# Patient Record
Sex: Female | Born: 2000 | Race: White | Hispanic: No | Marital: Single | Smoking: Never smoker
Health system: Southern US, Community
[De-identification: ages and names within clinical notes are randomized; demographics above are authoritative.]

## PROBLEM LIST (undated history)

## (undated) DIAGNOSIS — T7840XA Allergy, unspecified, initial encounter: Secondary | ICD-10-CM

## (undated) DIAGNOSIS — I639 Cerebral infarction, unspecified: Secondary | ICD-10-CM

## (undated) DIAGNOSIS — G809 Cerebral palsy, unspecified: Secondary | ICD-10-CM

## (undated) HISTORY — DX: Cerebral infarction, unspecified: I63.9

## (undated) HISTORY — PX: TIBIA / FIBIA LENGTHENING: SHX807

## (undated) HISTORY — DX: Cerebral palsy, unspecified: G80.9

## (undated) HISTORY — DX: Allergy, unspecified, initial encounter: T78.40XA

## (undated) HISTORY — PX: OTHER SURGICAL HISTORY: SHX169

## (undated) HISTORY — PX: HAMSTRING LENGTHENING: SHX1722

## (undated) HISTORY — PX: FOOT SURGERY: SHX648

---
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
## 2022-03-17 ENCOUNTER — Ambulatory Visit (INDEPENDENT_AMBULATORY_CARE_PROVIDER_SITE_OTHER): Payer: BC Managed Care – PPO | Admitting: Family Medicine

## 2022-03-17 ENCOUNTER — Encounter: Payer: Self-pay | Admitting: Family Medicine

## 2022-03-17 VITALS — BP 118/72 | HR 90 | Temp 98.4°F | Ht 62.0 in | Wt 116.0 lb

## 2022-03-17 DIAGNOSIS — G800 Spastic quadriplegic cerebral palsy: Secondary | ICD-10-CM

## 2022-03-17 MED ORDER — FEROSUL 325 (65 FE) MG PO TABS: 325.0000 mg | ORAL_TABLET | Freq: Every morning | ORAL | 1 refills | Status: AC

## 2022-03-17 NOTE — Assessment & Plan Note (Signed)
She is stable, at baseline (ambulatory, but with with some gait abnormalities, balance issues, and weakness). Rechecking CBC, iron, and vitamin D today. She has been on supplementation (refilling iron for her). Will make additional changes based on labs if indicated. PT referral placed to [facility] at her request. No acute concerns today.

## 2022-03-17 NOTE — Patient Instructions (Signed)
Thank you for choosing [facility] for your Primary Care needs. I am excited for the opportunity to partner with you to meet your health care goals. It was a pleasure meeting you today!   Information on diet, exercise, and health maintenance recommendations are listed below. This is information to help you be sure you are on track for optimal health and monitoring.   Please look over this and let us know if you have any questions or if you have completed any of the health maintenance outside of [facility] so that we can be sure your records are up to date.  ___________________________________________________________  MyChart:  For all urgent or time sensitive needs we ask that you please call the office to avoid delays. Our number is [phone]. MyChart is not constantly monitored and due to the large volume of messages a day, replies may take up to 72 business hours.  MyChart Policy: MyChart allows for you to see your visit notes, after visit summary, provider recommendations, lab and tests results, make an appointment, request refills, and contact your provider or the office for non-urgent questions or concerns. Providers are seeing patients during normal business hours and do not have built in time to review MyChart messages.  We ask that you allow a minimum of 3 business days for responses to MyChart messages. For this reason, please do not send urgent requests through MyChart. Please call the office at [phone]. New and ongoing conditions may require a visit. We have virtual and in-person visits available for your convenience.  Complex MyChart concerns may require a visit. Your provider may request you schedule a virtual or in-person visit to ensure we are providing the best care possible. MyChart messages sent after 11:00 AM on Friday will not be received by the provider until Monday morning.    Lab and Test Results: You will receive your lab and  test results on MyChart as soon as they are completed and results have been sent by the lab or testing facility. Due to this service, you will receive your results BEFORE your provider.  I review lab and test results each morning prior to seeing patients. Some results require collaboration with other providers to ensure you are receiving the most appropriate care. For this reason, we ask that you please allow a minimum of 3-5 business days from the time that ALL results have been received for your provider to receive and review lab and test results and contact you about these.  Most lab and test result comments from the provider will be sent through MyChart. Your provider may recommend changes to the plan of care, follow-up visits, repeat testing, ask questions, or request an office visit to discuss these results. You may reply directly to this message or call the office to provide information for the provider or set up an appointment. In some instances, you will be called with test results and recommendations. Please let us know if this is preferred and we will make note of this in your chart to provide this for you.    If you have not heard a response to your lab or test results in 5 business days from all results returning to MyChart, please call the office to let us know. We ask that you please avoid calling prior to this time unless there is an emergent concern. Due to high call volumes, this can delay the resulting process.  After Hours: For all non-emergency after hours needs,   please call the office at [phone] and select the option to reach the on-call  service. On-call services are shared between multiple [facility] offices and therefore it will not be possible to speak directly with your provider. On-call providers may provide medical advice and recommendations, but are unable to provide refills for maintenance medications.  For all emergency or urgent medical needs after normal business  hours, we recommend that you seek care at the closest Urgent Care or Emergency Department to ensure appropriate treatment in a timely manner.  [facility] has a 24 hour emergency room located on the ground floor for your convenience.   Urgent Concerns During the Business Day Providers are seeing patients from 8AM to 5PM with a busy schedule and are most often not able to respond to non-urgent calls until the end of the day or the next business day. If you should have URGENT concerns during the day, please call and speak to the nurse or schedule a same day appointment so that we can address your concern without delay.   Thank you, again, for choosing me as your health care partner. I appreciate your trust and look forward to learning more about you.   Levie Wages B. [name] Henton, DNP, FNP-C  ___________________________________________________________  Health Maintenance Recommendations Screening Testing Mammogram Every 1-2 years based on history and risk factors Starting at age 50 Pap Smear Ages 21-39 every 3 years Ages 30-65 every 5 years with HPV testing More frequent testing may be required based on results and history Colon Cancer Screening Every 1-10 years based on test performed, risk factors, and history Starting at age 45 Bone Density Screening Every 2-10 years based on history Starting at age 65 for women Recommendations for men differ based on medication usage, history, and risk factors AAA Screening One time ultrasound Men 65-75 years old who have ever smoked Lung Cancer Screening Low Dose Lung CT every 12 months Age 50-80 years with a 20 pack-year smoking history who still smoke or who have quit within the last 15 years  Screening Labs Routine  Labs: Complete Blood Count (CBC), Complete Metabolic Panel (CMP), Cholesterol (Lipid Panel) Every 6-12 months based on history and medications May be recommended more frequently based on current conditions or  previous results Hemoglobin A1c Lab Every 3-12 months based on history and previous results Starting at age 45 or earlier with diagnosis of diabetes, high cholesterol, BMI >26, and/or risk factors Frequent monitoring for patients with diabetes to ensure blood sugar control Thyroid Panel (TSH w/ T3 & T4) Every 6 months based on history, symptoms, and risk factors May be repeated more often if on medication HIV One time testing for all patients 13 and older May be repeated more frequently for patients with increased risk factors or exposure Hepatitis C One time testing for all patients 18 and older May be repeated more frequently for patients with increased risk factors or exposure Gonorrhea, Chlamydia Every 12 months for all sexually active persons 13-24 years Additional monitoring may be recommended for those who are considered high risk or who have symptoms PSA Men 40-54 years old with risk factors Additional screening may be recommended from age 55-69 based on risk factors, symptoms, and history  Vaccine Recommendations Tetanus Booster All adults every 10 years Flu Vaccine All patients 6 months and older every year COVID Vaccine All patients 12 years and older Initial dosing with booster May recommend additional booster based on age and health history HPV Vaccine 2 doses all patients   age 9-26 Dosing may be considered for patients over 26 Shingles Vaccine (Shingrix) 2 doses all adults 50 years and older Pneumonia (Pneumovax 23) All adults 65 years and older May recommend earlier dosing based on health history Pneumonia (Prevnar 13) All adults 65 years and older Dosed 1 year after Pneumovax 23 Pneumonia (Prevnar 20) All adults 65 years and older (adults 19-64 with certain conditions or risk factors) 1 dose  For those who have no received Prevnar 13 vaccine previously   Additional Screening, Testing, and Vaccinations may be recommended on an individualized basis based on  family history, health history, risk factors, and/or exposure.  __________________________________________________________  Diet Recommendations for All Patients  I recommend that all patients maintain a diet low in saturated fats, carbohydrates, and cholesterol. While this can be challenging at first, it is not impossible and small changes can make big differences.  Things to try: Decreasing the amount of soda, sweet tea, and/or juice to one or less per day and replace with water While water is always the first choice, if you do not like water you may consider adding a water additive without sugar to improve the taste other sugar free drinks Replace potatoes with a brightly colored vegetable at dinner Use healthy oils, such as canola oil or olive oil, instead of butter or hard margarine Limit your bread intake to two pieces or less a day Replace regular pasta with low carb pasta options Bake, broil, or grill foods instead of frying Monitor portion sizes  Eat smaller, more frequent meals throughout the day instead of large meals  An important thing to remember is, if you love foods that are not great for your health, you don't have to give them up completely. Instead, allow these foods to be a reward when you have done well. Allowing yourself to still have special treats every once in a while is a nice way to tell yourself thank you for working hard to keep yourself healthy.   Also remember that every day is a new day. If you have a bad day and "fall off the wagon", you can still climb right back up and keep moving along on your journey!  We have resources available to help you!  Some websites that may be helpful include: www.MyPlate.gov  Www.VeryWellFit.com _____________________________________________________________  Activity Recommendations for All Patients  I recommend that all adults get at least 20 minutes of moderate physical activity that elevates your heart rate at least 5  days out of the week.  Some examples include: Walking or jogging at a pace that allows you to carry on a conversation Cycling (stationary bike or outdoors) Water aerobics Yoga Weight lifting Dancing If physical limitations prevent you from putting stress on your joints, exercise in a pool or seated in a chair are excellent options.  Do determine your MAXIMUM heart rate for activity: 220 - YOUR AGE = MAX Heart Rate   Remember! Do not push yourself too hard.  Start slowly and build up your pace, speed, weight, time in exercise, etc.  Allow your body to rest between exercise and get good sleep. You will need more water than normal when you are exerting yourself. Do not wait until you are thirsty to drink. Drink with a purpose of getting in at least 8, 8 ounce glasses of water a day plus more depending on how much you exercise and sweat.    If you begin to develop dizziness, chest pain, abdominal pain, jaw pain, shortness of breath, headache,   vision changes, lightheadedness, or other concerning symptoms, stop the activity and allow your body to rest. If your symptoms are severe, seek emergency evaluation immediately. If your symptoms are concerning, but not severe, please let us know so that we can recommend further evaluation.     

## 2022-03-17 NOTE — Progress Notes (Signed)
New Patient Office Visit  Subjective    Patient ID: Kerri Robertson, female    DOB: July 09, 2000  Age: 21 y.o. MRN: [id number]  CC:  Chief Complaint  Patient presents with   Establish Care    Iron and starting back with PT at [facility]     HPI Kerri Robertson presents to establish care as a new patient. She was previously followed by Pediatrics at [facility] She is here with her caretaker Britt Boozer.   She lives with mom and dad.  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   Cerebral palsy - She has a left arm contracture and strength/balance issues primarily. They report she is high-risk for seizures, but she has never had one. She reports some muscle cramps/spasticity at times. She goes to PT [facility]) regularly but needs a new referral.  - she takes 325 mg ferrous sulfate daily.   - she takes vitamin D daily  She is having regular periods (about every 4 weeks, 4-6 days in length; LMP 02/27/22).  She denies any depression/anxiety, SI/HI. She wears prescription glasses - sees eye doctor regularly. She sees dentist regularly.     Outpatient Encounter Medications as of 03/17/2022  Medication Sig   Cholecalciferol (VITAMIN D-3) 25 MCG (1000 UT) CAPS Take by mouth.   [DISCONTINUED] ibuprofen (ADVIL) 200 MG tablet Take 200 mg by mouth every 6 (six) hours as needed for moderate pain.   FEROSUL 325 (65 Fe) MG tablet Take 1 tablet (325 mg total) by mouth every morning.   [DISCONTINUED] FEROSUL 325 (65 Fe) MG tablet Take 325 mg by mouth every morning. (Patient not taking: Reported on 03/17/2022)   [DISCONTINUED] ondansetron (ZOFRAN ODT) 4 MG disintegrating tablet 4mg  ODT q4 hours prn nausea/vomit (Patient not taking: Reported on 06/09/2019)   [DISCONTINUED] propranolol (INDERAL) 10 MG tablet Take 0.5 tablets (5 mg total) by mouth 3 (three) times daily as needed. (Patient not taking: Reported on 06/09/2019)   No facility-administered  encounter medications on file as of 03/17/2022.    Past Medical History:  Diagnosis Date   Allergy    Cerebral palsy ([name])    Stroke [facility])     Past Surgical History:  Procedure Laterality Date   ARM SURGERY     2013   FOOT SURGERY     2010   HAMSTRING LENGTHENING     TIBIA / FIBIA LENGTHENING      Family History  Problem Relation Age of Onset   Hypertension Mother    Migraines Mother    Depression Mother    Anxiety disorder Mother    Hypertension Father    Depression Father    Anxiety disorder Father    Hypertension Maternal Grandmother    Hypertension Maternal Grandfather    Heart disease Other    Diabetes Other    Seizures Neg Hx    Bipolar disorder Neg Hx    Schizophrenia Neg Hx    ADD / ADHD Neg Hx    Autism Neg Hx     Social History   Socioeconomic History   Marital status: Single    Spouse name: Not on file   Number of children: Not on file   Years of education: Not on file   Highest education level: Not on file  Occupational History   Not on file  Tobacco Use   Smoking status: Never   Smokeless tobacco: Never  Vaping Use  Vaping Use: Never used  Substance and Sexual Activity   Alcohol use: Not on file   Drug use: Never   Sexual activity: Not on file  Other Topics Concern   Not on file  Social History Narrative   Ruthanna is in the 12th grade at [employer]; she does well in school. She enjoys acting, tennis, bowling and singing. She lives with her mother and father.    Social Determinants of Health   Financial Resource Strain: Not on file  Food Insecurity: Not on file  Transportation Needs: Not on file  Physical Activity: Not on file  Stress: Not on file  Social Connections: Not on file  Intimate Partner Violence: Not on file    ROS All review of systems negative except what is listed in the HPI      Objective    BP 118/72   Pulse 90   Temp 98.4 F (36.9 C) (Oral)   Ht 5\' 2"  (1.575 m)   Wt 116 lb (52.6 kg)   LMP  02/26/2022   SpO2 99%   BMI 21.22 kg/m   Physical Exam Vitals reviewed.  Constitutional:      General: She is not in acute distress.    Appearance: Normal appearance. She is normal weight. She is not ill-appearing.  HENT:     Head: Normocephalic and atraumatic.  Cardiovascular:     Rate and Rhythm: Normal rate and regular rhythm.     Pulses: Normal pulses.     Heart sounds: Normal heart sounds.  Pulmonary:     Effort: Pulmonary effort is normal.     Breath sounds: Normal breath sounds.  Musculoskeletal:     Cervical back: Normal range of motion and neck supple. No tenderness.     Right lower leg: No edema.     Left lower leg: No edema.     Comments: LUE contracture, LLE mild malalignment   Lymphadenopathy:     Cervical: No cervical adenopathy.  Skin:    General: Skin is warm and dry.  Neurological:     Mental Status: She is alert and oriented to person, place, and time. Mental status is at baseline.     Cranial Nerves: No cranial nerve deficit.  Psychiatric:        Mood and Affect: Mood normal.        Behavior: Behavior normal.        Thought Content: Thought content normal.        Judgment: Judgment normal.            Assessment & Plan:   Problem List Items Addressed This Visit       Nervous and Auditory   CP (cerebral palsy), spastic, 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[name]) - Primary    She is stable, at baseline (ambulatory, but with with some gait abnormalities, balance issues, and weakness). Rechecking CBC, iron, and vitamin D today. She has been on supplementation (refilling iron for her). Will make additional changes based on labs if indicated. PT referral placed to [facility] at her request. No acute concerns today.       Relevant Medications   FEROSUL 325 (65 Fe) MG tablet   Other Relevant Orders   Ambulatory referral to Physical Therapy   Vitamin D (25 hydroxy)   Iron, TIBC and Ferritin Panel   CBC    Return in about 6 months (around 09/15/2022) for CPE 6-12 months  .   09/17/2022, NP

## 2022-03-18 LAB — CBC
HCT: 38.8 % (ref 36.0–49.0)
Hemoglobin: 13 g/dL (ref 12.0–16.0)
MCHC: 33.5 g/dL (ref 31.0–37.0)
MCV: 93.2 fl (ref 78.0–98.0)
Platelets: 205 10*3/uL (ref 150.0–575.0)
RBC: 4.17 Mil/uL (ref 3.80–5.70)
RDW: 12.5 % (ref 11.4–15.5)
WBC: 5.3 10*3/uL (ref 4.5–13.5)

## 2022-03-18 LAB — VITAMIN D 25 HYDROXY (VIT D DEFICIENCY, FRACTURES): VITD: 19 ng/mL — ABNORMAL LOW (ref 30.00–100.00)

## 2022-03-18 LAB — IRON,TIBC AND FERRITIN PANEL
%SAT: 12 % (calc) — ABNORMAL LOW (ref 15–45)
Ferritin: 57 ng/mL (ref 16–154)
Iron: 36 ug/dL (ref 27–164)
TIBC: 310 mcg/dL (calc) (ref 271–448)

## 2022-03-18 NOTE — Progress Notes (Signed)
-  Vitamin D is low. If you have not been taking your Vitamin D 1,000 units regularly then please restart. If you have been taking it, go ahead and take 2,000 units daily instead. We can recheck in 3 months or so - schedule follow-up. -Iron is slightly low. Restart iron supplementation - refill provided at appointment.  -Blood counts are stable.

## 2022-04-04 ENCOUNTER — Encounter: Payer: Self-pay | Admitting: Family Medicine

## 2022-04-09 NOTE — Progress Notes (Signed)
OUTPATIENT PHYSICAL THERAPY LOWER EXTREMITY EVALUATION   Patient Name: Kerri Robertson MRN: [id number] DOB:09-Jan-2001, 21 y.o., female Today's Date: 04/10/2022  END OF SESSION:  PT End of Session - 04/10/22 1623     Visit Number 1    Date for PT Re-Evaluation 06/19/22    PT Start Time 1454    PT Stop Time 1520    PT Time Calculation (min) 26 min    Activity Tolerance Patient tolerated treatment well    Behavior During Therapy WFL for tasks assessed/performed             Past Medical History:  Diagnosis Date   Allergy    Cerebral palsy ([name])    Stroke ([name])    Past Surgical History:  Procedure Laterality Date   ARM SURGERY     2013   FOOT SURGERY     2010   HAMSTRING LENGTHENING     TIBIA / FIBIA LENGTHENING     Patient Active Problem List   Diagnosis Date Noted   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   Balance problem 05/06/2018   Syncope 05/06/2018   History of cerebrovascular accident 11/18/2017   CP (cerebral palsy), spastic, 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[name]) 06/25/2017   Anxiety state 06/25/2017   Spasticity 06/25/2017   Allergic rhinitis 10/21/2016   Mild intermittent asthma 10/21/2016    PCP: Clayborne Dana, NP  REFERRING PROVIDER: Clayborne Dana, NP  REFERRING DIAG: G80.0 (ICD-10-CM) - CP (cerebral palsy), spastic, 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[name])   THERAPY DIAG:  Spastic quadriplegic cerebral palsy ([name])  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 Her Mom  noted that her leg was starting to turn in during gait as well.  PERTINENT HISTORY: CP (cerebral palsy), spastic, 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[name]) - Primary       She is stable, at baseline (ambulatory, but with with some gait abnormalities, balance issues, and weakness). Rechecking CBC, iron, and vitamin D today. She has been on supplementation (refilling iron for her). Will make additional changes based on labs if indicated. PT referral placed to [facility] at her request. No acute concerns today.     PAIN:  Are you having pain? No  PRECAUTIONS: None  WEIGHT BEARING RESTRICTIONS: No  FALLS:  Has patient fallen in last 6 months? Yes. 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 Hobbies- Runner, broadcasting/film/video, archery, baseball, tennis, dance   PLOF: Needs assistance with ADLs  PATIENT GOALS: Patient would like to improve her flexibility and balance.  NEXT MD VISIT:   OBJECTIVE:   DIAGNOSTIC FINDINGS:  Radiology consult confirms lumbar scoliosis with apex at L2-3, but since patient is asymptomatic, recommend monitoring it and continue to strengthen.        COGNITION: Overall cognitive status: Within functional limits for tasks assessed     SENSATION: Not tested   MUSCLE LENGTH: Hamstrings: Right 40 deg; Left 30 deg   POSTURE: left pelvic obliquity,  weight shift right, and hips rotated to L with L LE held in IR, increased R WB   LOWER EXTREMITY ROM: Trunk lateraly flexed due to scoliosis, L hip unable to achieve neutral ER, abd limited, B hamstrings tight, L ankle does not achieve neutral DF or inversion. LUE with limited ROM at all joints, in all planes. she wears a splint on her wrist at night.    LOWER EXTREMITY MMT: RLE 4/5, LLE with fluctuating tone throughout, HS very weak, hips also weak-MMT  accuracy questionable due to her tone.  FUNCTIONAL TESTS:  5 times sit to stand: 14.77 Timed up and go (TUG): 13.75 Berg Balance Scale: 38  GAIT: Distance walked: clinic distances Assistive device utilized: None Level of assistance: Modified independence Comments: Step-to pattern;Decreased step length - left;Decreased stance time - left;Decreased weight shift to left;Shuffle;Ataxic;Lateral hip instability;Trunk rotated posteriorly on left    TODAY'S TREATMENT:                                                                                                                              DATE:  04/10/22    PATIENT EDUCATION:  Education details: POC Person educated: Patient and Caregiver   Education method: Explanation Education comprehension: verbalized understanding  HOME EXERCISE PROGRAM: TBD  ASSESSMENT:  CLINICAL IMPRESSION: Patient is a 21 y.o. who was seen today for physical therapy evaluation and treatment for a tune up. Her Mom notes that her LLE appears more internally rotated during gait. She had a fall today, but a soft landing. She has not had any other falls and does not know why she fell today. She has multiple limitations in ROM and strength as well as abnormal tone on her L side, scoliosis resulting in her pelvis turned to the L. She will benefit from PT for some stretching to selective muscle groups as well as strength and balance training to decrease fall risk.   OBJECTIVE IMPAIRMENTS: Abnormal gait, decreased balance, decreased coordination, decreased endurance, decreased mobility, difficulty walking, decreased ROM, decreased strength, increased fascial restrictions, increased muscle spasms, impaired flexibility, impaired tone, improper body mechanics, and postural dysfunction.   ACTIVITY LIMITATIONS: standing, squatting, stairs, transfers, and locomotion level  PARTICIPATION LIMITATIONS: shopping, community activity, and occupation  PERSONAL FACTORS: 1  comorbidity: CP  are also affecting patient's functional outcome.   REHAB POTENTIAL: Good  CLINICAL DECISION MAKING: Stable/uncomplicated  EVALUATION COMPLEXITY: Moderate   GOALS: Goals reviewed with patient? Yes  SHORT TERM GOALS: Target date: 05/01/22 I with initial HEP Baseline: Goal status: INITIAL  LONG TERM GOALS: Target date: 06/19/22  I with final HEP Baseline:  Goal status: INITIAL  2.  Improve TUG time to < 12 sec Baseline:  Goal status: INITIAL  3.  Improve 5 x STS to <12 Baseline:  Goal status: INITIAL  4.  Increase BERG score to at least 44 Baseline:  Goal status: INITIAL  5.  Patient will ambulate with normalized gait pattern, minimizing abnormal weight  shifts and increasing LLE position to more neutral hip rotation. Baseline:  Goal status: INITIAL   PLAN:  PT FREQUENCY: 1-2x/week  PT DURATION: 10 weeks  PLANNED INTERVENTIONS: Therapeutic exercises, Therapeutic activity, Neuromuscular re-education, Balance training, Gait training, Patient/Family education, Self Care, Joint mobilization, Dry Needling, Cryotherapy, Moist heat, Ionotophoresis 4mg /ml Dexamethasone, and Manual therapy  PLAN FOR NEXT SESSION: HEP, HS strength, L Hip stretch into ER   Iona Beard, DPT 04/10/2022, 4:29 PM

## 2022-04-10 ENCOUNTER — Ambulatory Visit: Payer: BC Managed Care – PPO | Attending: Family Medicine | Admitting: Physical Therapy

## 2022-04-10 DIAGNOSIS — R531 Weakness: Secondary | ICD-10-CM | POA: Diagnosis present

## 2022-04-10 DIAGNOSIS — R293 Abnormal posture: Secondary | ICD-10-CM

## 2022-04-10 DIAGNOSIS — I69954 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: Secondary | ICD-10-CM | POA: Diagnosis present

## 2022-04-10 DIAGNOSIS — G801 Spastic diplegic cerebral palsy: Secondary | ICD-10-CM | POA: Diagnosis present

## 2022-04-10 DIAGNOSIS — R2681 Unsteadiness on feet: Secondary | ICD-10-CM

## 2022-04-10 DIAGNOSIS — R262 Difficulty in walking, not elsewhere classified: Secondary | ICD-10-CM

## 2022-04-10 DIAGNOSIS — M6281 Muscle weakness (generalized): Secondary | ICD-10-CM | POA: Diagnosis present

## 2022-04-10 DIAGNOSIS — G800 Spastic quadriplegic cerebral palsy: Secondary | ICD-10-CM | POA: Diagnosis present

## 2022-04-10 DIAGNOSIS — R278 Other lack of coordination: Secondary | ICD-10-CM

## 2022-04-16 ENCOUNTER — Encounter: Payer: Self-pay | Admitting: Physical Therapy

## 2022-04-16 ENCOUNTER — Ambulatory Visit: Payer: BC Managed Care – PPO | Admitting: Physical Therapy

## 2022-04-16 DIAGNOSIS — I69954 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: Secondary | ICD-10-CM

## 2022-04-16 DIAGNOSIS — R531 Weakness: Secondary | ICD-10-CM

## 2022-04-16 DIAGNOSIS — R2681 Unsteadiness on feet: Secondary | ICD-10-CM

## 2022-04-16 DIAGNOSIS — G800 Spastic quadriplegic cerebral palsy: Secondary | ICD-10-CM | POA: Diagnosis not present

## 2022-04-16 DIAGNOSIS — R278 Other lack of coordination: Secondary | ICD-10-CM

## 2022-04-16 DIAGNOSIS — R262 Difficulty in walking, not elsewhere classified: Secondary | ICD-10-CM

## 2022-04-16 NOTE — Therapy (Signed)
OUTPATIENT PHYSICAL THERAPY LOWER EXTREMITY EVALUATION     Patient Name: Kerri Robertson MRN: [id number] DOB:2000/05/25, 21 y.o., female Today's Date: 04/10/2022   END OF SESSION:   PT End of Session - 04/10/22 1623       Visit Number 1     Date for PT Re-Evaluation 06/19/22     PT Start Time 1454     PT Stop Time 1520     PT Time Calculation (min) 26 min     Activity Tolerance Patient tolerated treatment well     Behavior During Therapy WFL for tasks assessed/performed                       Past Medical History:  Diagnosis Date   Allergy     Cerebral palsy ([place])     Stroke ([place])           Past Surgical History:  Procedure Laterality Date   ARM SURGERY        2013   FOOT SURGERY        2010   HAMSTRING LENGTHENING       TIBIA / FIBIA LENGTHENING            Patient Active Problem List    Diagnosis Date Noted   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   Balance problem 05/06/2018   Syncope 05/06/2018   History of cerebrovascular accident 11/18/2017   CP (cerebral palsy), spastic, 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[place]) 06/25/2017   Anxiety state 06/25/2017   Spasticity 06/25/2017   Allergic rhinitis 10/21/2016   Mild intermittent asthma 10/21/2016      PCP: Terrilyn Saver, NP   REFERRING PROVIDER: Terrilyn Saver, NP   REFERRING DIAG: G80.0 (ICD-10-CM) - CP (cerebral palsy), spastic, 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[place])    THERAPY DIAG:  Spastic quadriplegic cerebral palsy ([place])   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   PERTINENT HISTORY:     CP (cerebral palsy), spastic, 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[place]) - Primary         She is stable, at baseline (ambulatory, but with with some gait abnormalities, balance issues, and weakness). Rechecking CBC, iron, and vitamin D today. She has been on supplementation (refilling iron for her). Will make additional changes based on labs if indicated. PT referral placed to [facility] at her request. No acute concerns today.      PAIN:  Are you having pain? No   PRECAUTIONS: None   WEIGHT BEARING RESTRICTIONS: No   FALLS:  Has patient fallen in last 6 months? Yes. 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 Hobbies- Editor, commissioning, archery, baseball, tennis, dance    PLOF: Needs assistance with ADLs   PATIENT GOALS: Patient would like to improve her flexibility and balance.   NEXT MD VISIT:    OBJECTIVE:    DIAGNOSTIC FINDINGS:  Radiology consult confirms lumbar scoliosis with apex at L2-3, but since patient is asymptomatic, recommend monitoring it and continue to strengthen.           COGNITION: Overall cognitive status: Within functional limits for tasks assessed                         SENSATION: Not tested     MUSCLE LENGTH: Hamstrings: Right 40 deg; Left 30 deg     POSTURE: left pelvic obliquity, weight shift right, and hips rotated to L with L LE held in IR, increased R WB     LOWER EXTREMITY ROM: Trunk lateraly flexed due to scoliosis, L hip unable to achieve neutral ER, abd limited, B hamstrings tight, L ankle does not achieve neutral DF or inversion. LUE with limited ROM at all joints, in all planes. she wears a splint on her wrist at night.      LOWER EXTREMITY MMT: RLE 4/5, LLE with fluctuating tone throughout,  HS very weak, hips also weak-MMT accuracy questionable due to her tone.   FUNCTIONAL TESTS:  5 times sit to stand: 14.77 Timed up and go (TUG): 13.75 Berg Balance Scale: 38   GAIT: Distance walked: clinic distances Assistive device utilized: None Level of assistance: Modified independence Comments: Step-to pattern;Decreased step length - left;Decreased stance time - left;Decreased weight shift to left;Shuffle;Ataxic;Lateral hip instability;Trunk rotated posteriorly on left      TODAY'S TREATMENT:                                                                                                                              DATE:  04/16/22 NuStep L4 x 6 minutes. Supine clamshells against yellow Tband Supine bridge with yellow Tband resistance Standing/step with RLE forward and back, emphasize LLE position in stance, tends to hold leg in IR. Standing step back and out with RLE to improve L hip ER. Ambulated 1 x 80' emphasizing tall posture over LLE and neutral rotation at L hip in WB. Sit <> stand from elevated mat with mod facilitation to maintain WB through LLE. 10 reps  04/10/22   education   PATIENT EDUCATION:  Education details: POC Person educated: Patient and Caregiver   Education method: Explanation Education comprehension: verbalized understanding   HOME EXERCISE PROGRAM: [id number]   ASSESSMENT:   CLINICAL IMPRESSION: Patient reports no issues. Initiated HEP, emphasizing LLE strength and mobility exercises to normalize gait. OBJECTIVE IMPAIRMENTS: Abnormal gait, decreased balance, decreased coordination, decreased endurance, decreased mobility, difficulty walking, decreased ROM, decreased strength, increased fascial restrictions, increased muscle spasms, impaired flexibility, impaired tone, improper body mechanics, and postural dysfunction.    ACTIVITY LIMITATIONS: standing, squatting, stairs, transfers, and locomotion level   PARTICIPATION LIMITATIONS: shopping,  community activity, and occupation   PERSONAL FACTORS: 1 comorbidity: CP  are also affecting patient's functional outcome.    REHAB POTENTIAL: Good   CLINICAL DECISION MAKING: Stable/uncomplicated   EVALUATION COMPLEXITY: Moderate  GOALS: Goals reviewed with patient? Yes   SHORT TERM GOALS: Target date: 05/01/22 I with initial HEP Baseline: Goal status: ongoing   LONG TERM GOALS: Target date: 06/19/22   I with final HEP Baseline:  Goal status: INITIAL   2.  Improve TUG time to < 12 sec Baseline:  Goal status: INITIAL   3.  Improve 5 x STS to <12 Baseline:  Goal status: INITIAL   4.  Increase BERG score to at least 44 Baseline:  Goal status: INITIAL   5.  Patient will ambulate with normalized gait pattern, minimizing abnormal weight shifts and increasing LLE position to more neutral hip rotation. Baseline:  Goal status: INITIAL     PLAN:   PT FREQUENCY: 1-2x/week   PT DURATION: 10 weeks   PLANNED INTERVENTIONS: Therapeutic exercises, Therapeutic activity, Neuromuscular re-education, Balance training, Gait training, Patient/Family education, Self Care, Joint mobilization, Dry Needling, Cryotherapy, Moist heat, Ionotophoresis 4mg /ml Dexamethasone, and Manual therapy   PLAN FOR NEXT SESSION: HEP, HS strength, L Hip stretch into ER     DPT 04/16/22 3:44 PM

## 2022-04-18 ENCOUNTER — Encounter: Payer: Self-pay | Admitting: Physical Therapy

## 2022-04-18 ENCOUNTER — Ambulatory Visit: Payer: BC Managed Care – PPO | Admitting: Physical Therapy

## 2022-04-18 DIAGNOSIS — G800 Spastic quadriplegic cerebral palsy: Secondary | ICD-10-CM | POA: Diagnosis not present

## 2022-04-18 DIAGNOSIS — R262 Difficulty in walking, not elsewhere classified: Secondary | ICD-10-CM

## 2022-04-18 DIAGNOSIS — M6281 Muscle weakness (generalized): Secondary | ICD-10-CM

## 2022-04-18 DIAGNOSIS — I69954 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: Secondary | ICD-10-CM

## 2022-04-18 DIAGNOSIS — R531 Weakness: Secondary | ICD-10-CM

## 2022-04-18 DIAGNOSIS — R293 Abnormal posture: Secondary | ICD-10-CM

## 2022-04-18 DIAGNOSIS — R278 Other lack of coordination: Secondary | ICD-10-CM

## 2022-04-18 DIAGNOSIS — R2681 Unsteadiness on feet: Secondary | ICD-10-CM

## 2022-04-18 NOTE — Therapy (Signed)
OUTPATIENT PHYSICAL THERAPY LOWER EXTREMITY EVALUATION     Patient Name: Kerri Robertson MRN: [id number] DOB:2000/06/06, 21 y.o., female Today's Date: 04/10/2022   END OF SESSION:   PT End of Session - 04/10/22 1623       Visit Number 1     Date for PT Re-Evaluation 06/19/22     PT Start Time 1454     PT Stop Time 1520     PT Time Calculation (min) 26 min     Activity Tolerance Patient tolerated treatment well     Behavior During Therapy WFL for tasks assessed/performed                       Past Medical History:  Diagnosis Date   Allergy     Cerebral palsy ([name])     Stroke ([name])           Past Surgical History:  Procedure Laterality Date   ARM SURGERY        2013   FOOT SURGERY        2010   HAMSTRING LENGTHENING       TIBIA / FIBIA LENGTHENING            Patient Active Problem List    Diagnosis Date Noted   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   Balance problem 05/06/2018   Syncope 05/06/2018   History of cerebrovascular accident 11/18/2017   CP (cerebral palsy), spastic, 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[name]) 06/25/2017   Anxiety state 06/25/2017   Spasticity 06/25/2017   Allergic rhinitis 10/21/2016   Mild intermittent asthma 10/21/2016      PCP: Clayborne Dana, NP   REFERRING PROVIDER: Clayborne Dana, NP   REFERRING DIAG: G80.0 (ICD-10-CM) - CP (cerebral palsy), spastic, 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[name])    THERAPY DIAG:  Spastic quadriplegic cerebral palsy ([name])   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   PERTINENT HISTORY:     CP (cerebral palsy), spastic, 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[name]) - Primary         She is stable, at baseline (ambulatory, but with with some gait abnormalities, balance issues, and weakness). Rechecking CBC, iron, and vitamin D today. She has been on supplementation (refilling iron for her). Will make additional changes based on labs if indicated. PT referral placed to [facility] at her request. No acute concerns today.      PAIN:  Are you having pain? No   PRECAUTIONS: None   WEIGHT BEARING RESTRICTIONS: No   FALLS:  Has patient fallen in last 6 months? Yes. 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 Hobbies- Runner, broadcasting/film/video, archery, baseball, tennis, dance    PLOF: Needs assistance with ADLs   PATIENT GOALS: Patient would like to improve her flexibility and balance.   NEXT MD VISIT:    OBJECTIVE:    DIAGNOSTIC FINDINGS:  Radiology  consult confirms lumbar scoliosis with apex at L2-3, but since patient is asymptomatic, recommend monitoring it and continue to strengthen.           COGNITION: Overall cognitive status: Within functional limits for tasks assessed                         SENSATION: Not tested     MUSCLE LENGTH: Hamstrings: Right 40 deg; Left 30 deg     POSTURE: left pelvic obliquity, weight shift right, and hips rotated to L with L LE held in IR, increased R WB     LOWER EXTREMITY ROM: Trunk lateraly flexed due to scoliosis, L hip unable to achieve neutral ER, abd limited, B hamstrings tight, L ankle does not achieve neutral DF or inversion. LUE with limited ROM at all joints, in all planes. she wears a splint on her wrist at night.      LOWER EXTREMITY MMT: RLE 4/5, LLE with fluctuating tone throughout, HS  very weak, hips also weak-MMT accuracy questionable due to her tone.   FUNCTIONAL TESTS:  5 times sit to stand: 14.77 Timed up and go (TUG): 13.75 Berg Balance Scale: 38   GAIT: Distance walked: clinic distances Assistive device utilized: None Level of assistance: Modified independence Comments: Step-to pattern;Decreased step length - left;Decreased stance time - left;Decreased weight shift to left;Shuffle;Ataxic;Lateral hip instability;Trunk rotated posteriorly on left      TODAY'S TREATMENT:                                                                                                                              DATE:  04/18/22 Bike L3 x 6 minutes Leg press 10 reps with BLE, 10 with LLE, 20#, emphasis on neutral hip rotation on L, mod A for the positioning. Seated hip abd against yellow Tband, able to hold LLE, but no true abd. 20 reps U paloff with RUE, yellow Tband, 2 x 10 reps, min TC to wt shift onto LLE and stabilize on L B side stepping over lines on the floor, x8 in each direction-Initially had much difficulty with balance, improved to speed up with less assist. Sit <> stand x 10, therapist facilitated wt shift onto LLE.   04/16/22 NuStep L4 x 6 minutes. Supine clamshells against yellow Tband Supine bridge with yellow Tband resistance Standing/step with RLE forward and back, emphasize LLE position in stance, tends to hold leg in IR. Standing step back and out with RLE to improve L hip ER. Ambulated 1 x 80' emphasizing tall posture over LLE and neutral rotation at L hip in WB. Sit <> stand from elevated mat with mod facilitation to maintain WB through LLE. 10 reps  04/10/22   education   PATIENT EDUCATION:  Education details: POC Person educated: Patient and Caregiver   Education method: Explanation Education comprehension: verbalized understanding   HOME EXERCISE PROGRAM: [id number]   ASSESSMENT:   CLINICAL IMPRESSION: Patient  reports no issues. Performed  activities to improve functional use of LLE and balance, required up to mod facilitation.  OBJECTIVE IMPAIRMENTS: Abnormal gait, decreased balance, decreased coordination, decreased endurance, decreased mobility, difficulty walking, decreased ROM, decreased strength, increased fascial restrictions, increased muscle spasms, impaired flexibility, impaired tone, improper body mechanics, and postural dysfunction.    ACTIVITY LIMITATIONS: standing, squatting, stairs, transfers, and locomotion level   PARTICIPATION LIMITATIONS: shopping, community activity, and occupation   PERSONAL FACTORS: 1 comorbidity: CP  are also affecting patient's functional outcome.    REHAB POTENTIAL: Good   CLINICAL DECISION MAKING: Stable/uncomplicated   EVALUATION COMPLEXITY: Moderate     GOALS: Goals reviewed with patient? Yes   SHORT TERM GOALS: Target date: 05/01/22 I with initial HEP Baseline: Goal status: ongoing   LONG TERM GOALS: Target date: 06/19/22   I with final HEP Baseline:  Goal status: INITIAL   2.  Improve TUG time to < 12 sec Baseline:  Goal status: ongoing  3.  Improve 5 x STS to <12 Baseline:  Goal status: INITIAL   4.  Increase BERG score to at least 44 Baseline:  Goal status: INITIAL   5.  Patient will ambulate with normalized gait pattern, minimizing abnormal weight shifts and increasing LLE position to more neutral hip rotation. Baseline:  Goal status: INITIAL     PLAN:   PT FREQUENCY: 1-2x/week   PT DURATION: 10 weeks   PLANNED INTERVENTIONS: Therapeutic exercises, Therapeutic activity, Neuromuscular re-education, Balance training, Gait training, Patient/Family education, Self Care, Joint mobilization, Dry Needling, Cryotherapy, Moist heat, Ionotophoresis 4mg /ml Dexamethasone, and Manual therapy   PLAN FOR NEXT SESSION: HEP, HS strength, L Hip stretch into ER     DPT 04/18/22 8:43 AM

## 2022-04-21 ENCOUNTER — Ambulatory Visit: Payer: BC Managed Care – PPO | Admitting: Physical Therapy

## 2022-04-21 ENCOUNTER — Encounter: Payer: Self-pay | Admitting: Physical Therapy

## 2022-04-21 DIAGNOSIS — M6281 Muscle weakness (generalized): Secondary | ICD-10-CM

## 2022-04-21 DIAGNOSIS — R2681 Unsteadiness on feet: Secondary | ICD-10-CM

## 2022-04-21 DIAGNOSIS — I69954 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: Secondary | ICD-10-CM

## 2022-04-21 DIAGNOSIS — R293 Abnormal posture: Secondary | ICD-10-CM

## 2022-04-21 DIAGNOSIS — G800 Spastic quadriplegic cerebral palsy: Secondary | ICD-10-CM | POA: Diagnosis not present

## 2022-04-21 DIAGNOSIS — R531 Weakness: Secondary | ICD-10-CM

## 2022-04-21 DIAGNOSIS — R278 Other lack of coordination: Secondary | ICD-10-CM

## 2022-04-21 DIAGNOSIS — R262 Difficulty in walking, not elsewhere classified: Secondary | ICD-10-CM

## 2022-04-21 NOTE — Therapy (Signed)
OUTPATIENT PHYSICAL THERAPY LOWER EXTREMITY EVALUATION     Patient Name: Kerri Robertson MRN: [id number] DOB:[birth year], 21 y.o., female Today's Date: 04/10/2022   END OF SESSION:   PT End of Session - 04/10/22 1623       Visit Number 3    Date for PT Re-Evaluation 06/19/22     PT Start Time 1015    PT Stop Time 1520     PT Time Calculation (min)     Activity Tolerance Patient tolerated treatment well     Behavior During Therapy WFL for tasks assessed/performed                       Past Medical History:  Diagnosis Date   Allergy     Cerebral palsy ([name])     Stroke [facility])           Past Surgical History:  Procedure Laterality Date   ARM SURGERY        2013   FOOT SURGERY        2010   HAMSTRING LENGTHENING       TIBIA / FIBIA LENGTHENING            Patient Active Problem List    Diagnosis Date Noted   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   Balance problem 05/06/2018   Syncope 05/06/2018   History of cerebrovascular accident 11/18/2017   CP (cerebral palsy), spastic, 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[name]) 06/25/2017   Anxiety state 06/25/2017   Spasticity 06/25/2017   Allergic rhinitis 10/21/2016   Mild intermittent asthma 10/21/2016      PCP: Clayborne Dana, NP   REFERRING PROVIDER: Clayborne Dana, NP   REFERRING DIAG: G80.0 (ICD-10-CM) - CP (cerebral palsy), spastic, 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[name])    THERAPY DIAG:  Spastic quadriplegic cerebral palsy ([name])   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 Rechecking CBC, iron, and vitamin D today. She has been on supplementation (refilling iron for her). Will make additional changes based on labs if indicated. PT referral placed to [facility] at her request. No acute concerns today.      PAIN:  Are you having pain? No   PRECAUTIONS: None   WEIGHT BEARING RESTRICTIONS: No   FALLS:  Has patient fallen in last 6 months? Yes. 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 Hobbies- Runner, broadcasting/film/video, archery, baseball, tennis, dance    PLOF: Needs assistance with ADLs   PATIENT GOALS: Patient would like to improve her flexibility and balance.   NEXT MD VISIT:    OBJECTIVE:    DIAGNOSTIC FINDINGS:  Radiology consult confirms  lumbar scoliosis with apex at L2-3, but since patient is asymptomatic, recommend monitoring it and continue to strengthen.           COGNITION: Overall cognitive status: Within functional limits for tasks assessed                         SENSATION: Not tested     MUSCLE LENGTH: Hamstrings: Right 40 deg; Left 30 deg     POSTURE: left pelvic obliquity, weight shift right, and hips rotated to L with L LE held in IR, increased R WB     LOWER EXTREMITY ROM: Trunk lateraly flexed due to scoliosis, L hip unable to achieve neutral ER, abd limited, B hamstrings tight, L ankle does not achieve neutral DF or inversion. LUE with limited ROM at all joints, in all planes. she wears a splint on her wrist at night.      LOWER EXTREMITY MMT: RLE 4/5, LLE with fluctuating tone throughout, HS  very weak, hips also weak-MMT accuracy questionable due to her tone.   FUNCTIONAL TESTS:  5 times sit to stand: 14.77 Timed up and go (TUG): 13.75 Berg Balance Scale: 38   GAIT: Distance walked: clinic distances Assistive device utilized: None Level of assistance: Modified independence Comments: Step-to pattern;Decreased step length - left;Decreased stance time - left;Decreased weight shift to left;Shuffle;Ataxic;Lateral hip instability;Trunk rotated posteriorly on left      TODAY'S TREATMENT:                                                                                                                              DATE:  04/21/22 Nustep Level 4 x 6 minutes Leg press 20# 2x10, left only 4x5 Bridges ball b/n knees 2x10 HS, piriformis and adductor stretch on the left Crunches 3x6 Lower trunk rotations Feet on ball trunk rotations Feet on ball bridges 4" toe touches holding rail and then without Side stepping over objects Side step on and off airex holding and without On airex volleyball,    04/18/22 Bike L3 x 6 minutes Leg press 10 reps with BLE, 10 with LLE, 20#, emphasis on neutral hip rotation on L, mod A for the positioning. Seated hip abd against yellow Tband, able to hold LLE, but no true abd. 20 reps U paloff with RUE, yellow Tband, 2 x 10 reps, min TC to wt shift onto LLE and stabilize on L B side stepping over lines on the floor, x8 in each direction-Initially had much difficulty with balance, improved to speed up with less assist. Sit <> stand x 10, therapist facilitated wt shift onto LLE.   04/16/22 NuStep L4 x 6 minutes. Supine clamshells against yellow Tband Supine bridge with yellow Tband resistance Standing/step with RLE forward and back, emphasize LLE position in stance, tends to hold leg in IR. Standing step back and out with RLE to improve L hip ER. Ambulated 1 x 80' emphasizing tall posture over  LLE and neutral rotation at L hip in WB. Sit <> stand  from elevated mat with mod facilitation to maintain WB through LLE. 10 reps  04/10/22   education   PATIENT EDUCATION:  Education details: POC Person educated: Patient and Caregiver   Education method: Explanation Education comprehension: verbalized understanding   HOME EXERCISE PROGRAM: [id number]   ASSESSMENT:   CLINICAL IMPRESSION: Patient needed cues for leg placement, used a ball b/n knees a few times to help with the adduction. She needed CGA for the balance activities and really struggled with the airex. When she got tired the right knee started going into flexion OBJECTIVE IMPAIRMENTS: Abnormal gait, decreased balance, decreased coordination, decreased endurance, decreased mobility, difficulty walking, decreased ROM, decreased strength, increased fascial restrictions, increased muscle spasms, impaired flexibility, impaired tone, improper body mechanics, and postural dysfunction.    ACTIVITY LIMITATIONS: standing, squatting, stairs, transfers, and locomotion level   PARTICIPATION LIMITATIONS: shopping, community activity, and occupation   PERSONAL FACTORS: 1 comorbidity: CP  are also affecting patient's functional outcome.    REHAB POTENTIAL: Good   CLINICAL DECISION MAKING: Stable/uncomplicated   EVALUATION COMPLEXITY: Moderate     GOALS: Goals reviewed with patient? Yes   SHORT TERM GOALS: Target date: 05/01/22 I with initial HEP Baseline: Goal status: ongoing   LONG TERM GOALS: Target date: 06/19/22   I with final HEP Baseline:  Goal status: INITIAL   2.  Improve TUG time to < 12 sec Baseline:  Goal status: ongoing  3.  Improve 5 x STS to <12 Baseline:  Goal status: INITIAL   4.  Increase BERG score to at least 44 Baseline:  Goal status: INITIAL   5.  Patient will ambulate with normalized gait pattern, minimizing abnormal weight shifts and increasing LLE position to more neutral hip rotation. Baseline:  Goal status: INITIAL     PLAN:   PT  FREQUENCY: 1-2x/week   PT DURATION: 10 weeks   PLANNED INTERVENTIONS: Therapeutic exercises, Therapeutic activity, Neuromuscular re-education, Balance training, Gait training, Patient/Family education, Self Care, Joint mobilization, Dry Needling, Cryotherapy, Moist heat, Ionotophoresis 4mg /ml Dexamethasone, and Manual therapy   PLAN FOR NEXT SESSION: HEP, HS strength, L Hip stretch into ER     , PT 04/21/22 9:35 AM

## 2022-04-24 ENCOUNTER — Ambulatory Visit: Payer: BC Managed Care – PPO | Admitting: Physical Therapy

## 2022-04-24 ENCOUNTER — Encounter: Payer: Self-pay | Admitting: Physical Therapy

## 2022-04-24 DIAGNOSIS — G800 Spastic quadriplegic cerebral palsy: Secondary | ICD-10-CM | POA: Diagnosis not present

## 2022-04-24 DIAGNOSIS — I69954 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: Secondary | ICD-10-CM

## 2022-04-24 DIAGNOSIS — R278 Other lack of coordination: Secondary | ICD-10-CM

## 2022-04-24 DIAGNOSIS — R531 Weakness: Secondary | ICD-10-CM

## 2022-04-24 DIAGNOSIS — R262 Difficulty in walking, not elsewhere classified: Secondary | ICD-10-CM

## 2022-04-24 DIAGNOSIS — R2681 Unsteadiness on feet: Secondary | ICD-10-CM

## 2022-04-24 DIAGNOSIS — R293 Abnormal posture: Secondary | ICD-10-CM

## 2022-04-24 NOTE — Therapy (Signed)
OUTPATIENT PHYSICAL THERAPY LOWER EXTREMITY EVALUATION     Patient Name: Kerri Robertson MRN: [id number] DOB:[birth year], 21 y.o., female Today's Date: 04/10/2022   END OF SESSION:   PT End of Session - 04/10/22 1623       Visit Number 3    Date for PT Re-Evaluation 06/19/22     PT Start Time 1015    PT Stop Time 1520     PT Time Calculation (min)     Activity Tolerance Patient tolerated treatment well     Behavior During Therapy WFL for tasks assessed/performed                       Past Medical History:  Diagnosis Date   Allergy     Cerebral palsy ([name])     Stroke [facility])           Past Surgical History:  Procedure Laterality Date   ARM SURGERY        2013   FOOT SURGERY        2010   HAMSTRING LENGTHENING       TIBIA / FIBIA LENGTHENING            Patient Active Problem List    Diagnosis Date Noted   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   Balance problem 05/06/2018   Syncope 05/06/2018   History of cerebrovascular accident 11/18/2017   CP (cerebral palsy), spastic, 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[name]) 06/25/2017   Anxiety state 06/25/2017   Spasticity 06/25/2017   Allergic rhinitis 10/21/2016   Mild intermittent asthma 10/21/2016      PCP: Clayborne Dana, NP   REFERRING PROVIDER: Clayborne Dana, NP   REFERRING DIAG: G80.0 (ICD-10-CM) - CP (cerebral palsy), spastic, 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[name])    THERAPY DIAG:  Spastic quadriplegic cerebral palsy ([name])   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 Rechecking CBC, iron, and vitamin D today. She has been on supplementation (refilling iron for her). Will make additional changes based on labs if indicated. PT referral placed to [facility] at her request. No acute concerns today.      PAIN:  Are you having pain? No   PRECAUTIONS: None   WEIGHT BEARING RESTRICTIONS: No   FALLS:  Has patient fallen in last 6 months? Yes. 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 Hobbies- Runner, broadcasting/film/video, archery, baseball, tennis, dance    PLOF: Needs assistance with ADLs   PATIENT GOALS: Patient would like to improve her flexibility and balance.   NEXT MD VISIT:    OBJECTIVE:    DIAGNOSTIC FINDINGS:  Radiology consult confirms  lumbar scoliosis with apex at L2-3, but since patient is asymptomatic, recommend monitoring it and continue to strengthen.           COGNITION: Overall cognitive status: Within functional limits for tasks assessed                         SENSATION: Not tested     MUSCLE LENGTH: Hamstrings: Right 40 deg; Left 30 deg     POSTURE: left pelvic obliquity, weight shift right, and hips rotated to L with L LE held in IR, increased R WB     LOWER EXTREMITY ROM: Trunk lateraly flexed due to scoliosis, L hip unable to achieve neutral ER, abd limited, B hamstrings tight, L ankle does not achieve neutral DF or inversion. LUE with limited ROM at all joints, in all planes. she wears a splint on her wrist at night.      LOWER EXTREMITY MMT: RLE 4/5, LLE with fluctuating tone throughout, HS  very weak, hips also weak-MMT accuracy questionable due to her tone.   FUNCTIONAL TESTS:  5 times sit to stand: 14.77 Timed up and go (TUG): 13.75 Berg Balance Scale: 38   GAIT: Distance walked: clinic distances Assistive device utilized: None Level of assistance: Modified independence Comments: Step-to pattern;Decreased step length - left;Decreased stance time - left;Decreased weight shift to left;Shuffle;Ataxic;Lateral hip instability;Trunk rotated posteriorly on left      TODAY'S TREATMENT:                                                                                                                              DATE:  04/24/22 NuStep L5 x 6 minutes Supine legs over 65 cm physioball- rolled DKTC, B bridge, U bridge with R, then L leg, LTR Supine with LLE pushed against red physioball, min A for positioning, push and hold x 10 sec, 10 reps. Sit to stand from elevated mat, LLE slightly behind R and therapist facilitating weight shift onto LLE. Standing alternate taps on 4" step, min A for balance and facilitation of LLE in stance. Place LLE on 4" step, rotate to R while maintaining LLE in position to stretch medial L hip.   04/21/22 Nustep Level 4 x 6 minutes Leg press 20# 2x10, left only 4x5 Bridges ball b/n knees 2x10 HS, piriformis and adductor stretch on the left Crunches 3x6 Lower trunk rotations Feet on ball trunk rotations Feet on ball bridges 4" toe touches holding rail and then without Side stepping over objects Side step on and off airex holding and without On airex volleyball,    04/18/22 Bike L3 x 6 minutes Leg press 10 reps with BLE, 10 with LLE, 20#, emphasis on neutral hip rotation on L, mod A for the positioning. Seated hip abd against yellow Tband, able to hold LLE, but no true abd. 20 reps U paloff with RUE, yellow Tband, 2 x 10 reps, min TC to wt shift onto  LLE and stabilize on L B side stepping over lines on the floor, x8 in each direction-Initially  had much difficulty with balance, improved to speed up with less assist. Sit <> stand x 10, therapist facilitated wt shift onto LLE.   04/16/22 NuStep L4 x 6 minutes. Supine clamshells against yellow Tband Supine bridge with yellow Tband resistance Standing/step with RLE forward and back, emphasize LLE position in stance, tends to hold leg in IR. Standing step back and out with RLE to improve L hip ER. Ambulated 1 x 80' emphasizing tall posture over LLE and neutral rotation at L hip in WB. Sit <> stand from elevated mat with mod facilitation to maintain WB through LLE. 10 reps  04/10/22   education   PATIENT EDUCATION:  Education details: POC Person educated: Patient and Caregiver   Education method: Explanation Education comprehension: verbalized understanding   HOME EXERCISE PROGRAM: [id number]   ASSESSMENT:   CLINICAL IMPRESSION: Patient appeared to be slightly improved with push through LLE and also mildly decreased IR noted at times. OBJECTIVE IMPAIRMENTS: Abnormal gait, decreased balance, decreased coordination, decreased endurance, decreased mobility, difficulty walking, decreased ROM, decreased strength, increased fascial restrictions, increased muscle spasms, impaired flexibility, impaired tone, improper body mechanics, and postural dysfunction.    ACTIVITY LIMITATIONS: standing, squatting, stairs, transfers, and locomotion level   PARTICIPATION LIMITATIONS: shopping, community activity, and occupation   PERSONAL FACTORS: 1 comorbidity: CP  are also affecting patient's functional outcome.    REHAB POTENTIAL: Good   CLINICAL DECISION MAKING: Stable/uncomplicated   EVALUATION COMPLEXITY: Moderate     GOALS: Goals reviewed with patient? Yes   SHORT TERM GOALS: Target date: 05/01/22 I with initial HEP Baseline: Goal status: ongoing   LONG TERM GOALS: Target date: 06/19/22   I with final HEP Baseline:  Goal status: INITIAL   2.  Improve TUG time to < 12  sec Baseline:  Goal status: ongoing  3.  Improve 5 x STS to <12 Baseline:  Goal status: INITIAL   4.  Increase BERG score to at least 44 Baseline:  Goal status: INITIAL   5.  Patient will ambulate with normalized gait pattern, minimizing abnormal weight shifts and increasing LLE position to more neutral hip rotation. Baseline:  Goal status: INITIAL     PLAN:   PT FREQUENCY: 1-2x/week   PT DURATION: 10 weeks   PLANNED INTERVENTIONS: Therapeutic exercises, Therapeutic activity, Neuromuscular re-education, Balance training, Gait training, Patient/Family education, Self Care, Joint mobilization, Dry Needling, Cryotherapy, Moist heat, Ionotophoresis 4mg /ml Dexamethasone, and Manual therapy   PLAN FOR NEXT SESSION: HEP, HS strength, L Hip stretch into ER     DPT 04/24/22 3:25 PM  04/24/22 3:25 PM

## 2022-05-07 ENCOUNTER — Encounter: Payer: Self-pay | Admitting: Physical Therapy

## 2022-05-07 ENCOUNTER — Ambulatory Visit: Payer: BC Managed Care – PPO | Attending: Family Medicine | Admitting: Physical Therapy

## 2022-05-07 DIAGNOSIS — R2681 Unsteadiness on feet: Secondary | ICD-10-CM | POA: Insufficient documentation

## 2022-05-07 DIAGNOSIS — G800 Spastic quadriplegic cerebral palsy: Secondary | ICD-10-CM | POA: Diagnosis not present

## 2022-05-07 DIAGNOSIS — R293 Abnormal posture: Secondary | ICD-10-CM | POA: Insufficient documentation

## 2022-05-07 DIAGNOSIS — R278 Other lack of coordination: Secondary | ICD-10-CM | POA: Insufficient documentation

## 2022-05-07 DIAGNOSIS — R531 Weakness: Secondary | ICD-10-CM | POA: Insufficient documentation

## 2022-05-07 DIAGNOSIS — R262 Difficulty in walking, not elsewhere classified: Secondary | ICD-10-CM

## 2022-05-07 DIAGNOSIS — M6281 Muscle weakness (generalized): Secondary | ICD-10-CM | POA: Diagnosis present

## 2022-05-07 DIAGNOSIS — I69954 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: Secondary | ICD-10-CM | POA: Diagnosis present

## 2022-05-07 NOTE — Therapy (Signed)
OUTPATIENT PHYSICAL THERAPY LOWER EXTREMITY EVALUATION     Patient Name: Kerri Robertson MRN: [id number] DOB:07-09-2000, 22 y.o., female Today's Date: 04/10/2022   END OF SESSION:   PT End of Session - 04/10/22 1623       Visit Number 3    Date for PT Re-Evaluation 06/19/22     PT Start Time 1015    PT Stop Time 1520     PT Time Calculation (min)     Activity Tolerance Patient tolerated treatment well     Behavior During Therapy WFL for tasks assessed/performed                       Past Medical History:  Diagnosis Date   Allergy     Cerebral palsy ([name])     Stroke [facility])           Past Surgical History:  Procedure Laterality Date   ARM SURGERY        2013   FOOT SURGERY        2010   HAMSTRING LENGTHENING       TIBIA / FIBIA LENGTHENING            Patient Active Problem List    Diagnosis Date Noted   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   Balance problem 05/06/2018   Syncope 05/06/2018   History of cerebrovascular accident 11/18/2017   CP (cerebral palsy), spastic, 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[name]) 06/25/2017   Anxiety state 06/25/2017   Spasticity 06/25/2017   Allergic rhinitis 10/21/2016   Mild intermittent asthma 10/21/2016      PCP: Clayborne Dana, NP   REFERRING PROVIDER: Clayborne Dana, NP   REFERRING DIAG: G80.0 (ICD-10-CM) - CP (cerebral palsy), spastic, 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[name])    THERAPY DIAG:  Spastic quadriplegic cerebral palsy ([name])   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 Rechecking CBC, iron, and vitamin D today. She has been on supplementation (refilling iron for her). Will make additional changes based on labs if indicated. PT referral placed to [facility] at her request. No acute concerns today.      PAIN:  Are you having pain? No   PRECAUTIONS: None   WEIGHT BEARING RESTRICTIONS: No   FALLS:  Has patient fallen in last 6 months? Yes. 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 Hobbies- Runner, broadcasting/film/video, archery, baseball, tennis, dance    PLOF: Needs assistance with ADLs   PATIENT GOALS: Patient would like to improve her flexibility and balance.   NEXT MD VISIT:    OBJECTIVE:    DIAGNOSTIC FINDINGS:  Radiology consult confirms  lumbar scoliosis with apex at L2-3, but since patient is asymptomatic, recommend monitoring it and continue to strengthen.           COGNITION: Overall cognitive status: Within functional limits for tasks assessed                         SENSATION: Not tested     MUSCLE LENGTH: Hamstrings: Right 40 deg; Left 30 deg     POSTURE: left pelvic obliquity, weight shift right, and hips rotated to L with L LE held in IR, increased R WB     LOWER EXTREMITY ROM: Trunk lateraly flexed due to scoliosis, L hip unable to achieve neutral ER, abd limited, B hamstrings tight, L ankle does not achieve neutral DF or inversion. LUE with limited ROM at all joints, in all planes. she wears a splint on her wrist at night.      LOWER EXTREMITY MMT: RLE 4/5, LLE with fluctuating tone throughout, HS  very weak, hips also weak-MMT accuracy questionable due to her tone.   FUNCTIONAL TESTS:  5 times sit to stand: 14.77 Timed up and go (TUG): 13.75 Berg Balance Scale: 38   GAIT: Distance walked: clinic distances Assistive device utilized: None Level of assistance: Modified independence Comments: Step-to pattern;Decreased step length - left;Decreased stance time - left;Decreased weight shift to left;Shuffle;Ataxic;Lateral hip instability;Trunk rotated posteriorly on left      TODAY'S TREATMENT:                                                                                                                              DATE:  05/07/22 NuStep L5 x 6 minutes Sit to stand on Air ex pad with red tband resistance for hip abd, 10 reps Resisted gait with 10#, x 4 in each direction. Standing reach/pull with RUE into horizontal abd, then horiz add, 10 reps each, including rotation and weight shift onto LLE.  Standing with back to wall, red physioball positioned in lower back, squat slightly and maintain while rotating trunk R/L 2 x 10  04/24/22 NuStep L5 x 6 minutes Supine legs over 65 cm physioball- rolled DKTC, B bridge, U bridge with R, then L leg, LTR Supine with LLE pushed against red physioball, min A for positioning, push and hold x 10 sec, 10 reps. Sit to stand from elevated mat, LLE slightly behind R and therapist facilitating weight shift onto LLE. Standing alternate taps on 4" step, min A for balance and facilitation of LLE in stance. Place LLE on 4" step, rotate to R while maintaining LLE in position to stretch medial L hip.   04/21/22 Nustep Level 4 x 6 minutes Leg press 20# 2x10, left only 4x5 Bridges ball b/n knees 2x10 HS, piriformis and adductor stretch on the left Crunches 3x6 Lower trunk rotations Feet on ball trunk rotations Feet on ball bridges 4" toe touches holding rail and then without Side stepping over objects  Side step on and off airex holding and without On  airex volleyball,    04/18/22 Bike L3 x 6 minutes Leg press 10 reps with BLE, 10 with LLE, 20#, emphasis on neutral hip rotation on L, mod A for the positioning. Seated hip abd against yellow Tband, able to hold LLE, but no true abd. 20 reps U paloff with RUE, yellow Tband, 2 x 10 reps, min TC to wt shift onto LLE and stabilize on L B side stepping over lines on the floor, x8 in each direction-Initially had much difficulty with balance, improved to speed up with less assist. Sit <> stand x 10, therapist facilitated wt shift onto LLE.   04/16/22 NuStep L4 x 6 minutes. Supine clamshells against yellow Tband Supine bridge with yellow Tband resistance Standing/step with RLE forward and back, emphasize LLE position in stance, tends to hold leg in IR. Standing step back and out with RLE to improve L hip ER. Ambulated 1 x 80' emphasizing tall posture over LLE and neutral rotation at L hip in WB. Sit <> stand from elevated mat with mod facilitation to maintain WB through LLE. 10 reps  04/10/22   education   PATIENT EDUCATION:  Education details: POC Person educated: Patient and Caregiver   Education method: Explanation Education comprehension: verbalized understanding   HOME EXERCISE PROGRAM: [id number]   ASSESSMENT:   CLINICAL IMPRESSION: Patient reports no changes. She participated well, treatment emphasizing L sided activation and stability.   OBJECTIVE IMPAIRMENTS: Abnormal gait, decreased balance, decreased coordination, decreased endurance, decreased mobility, difficulty walking, decreased ROM, decreased strength, increased fascial restrictions, increased muscle spasms, impaired flexibility, impaired tone, improper body mechanics, and postural dysfunction.    ACTIVITY LIMITATIONS: standing, squatting, stairs, transfers, and locomotion level   PARTICIPATION LIMITATIONS: shopping, community activity, and occupation   PERSONAL FACTORS: 1 comorbidity: CP  are also affecting  patient's functional outcome.    REHAB POTENTIAL: Good   CLINICAL DECISION MAKING: Stable/uncomplicated   EVALUATION COMPLEXITY: Moderate     GOALS: Goals reviewed with patient? Yes   SHORT TERM GOALS: Target date: 05/01/22 I with initial HEP Baseline: Goal status: ongoing   LONG TERM GOALS: Target date: 06/19/22   I with final HEP Baseline:  Goal status: INITIAL   2.  Improve TUG time to < 12 sec Baseline:  Goal status: ongoing  3.  Improve 5 x STS to <12 Baseline:  Goal status: INITIAL   4.  Increase BERG score to at least 44 Baseline:  Goal status: INITIAL   5.  Patient will ambulate with normalized gait pattern, minimizing abnormal weight shifts and increasing LLE position to more neutral hip rotation. Baseline:  Goal status: INITIAL     PLAN:   PT FREQUENCY: 1-2x/week   PT DURATION: 10 weeks   PLANNED INTERVENTIONS: Therapeutic exercises, Therapeutic activity, Neuromuscular re-education, Balance training, Gait training, Patient/Family education, Self Care, Joint mobilization, Dry Needling, Cryotherapy, Moist heat, Ionotophoresis 4mg /ml Dexamethasone, and Manual therapy   PLAN FOR NEXT SESSION: HEP, HS strength, L Hip stretch into ER     Ethel Rana DPT 05/07/22 1:11 PM

## 2022-05-21 DIAGNOSIS — S83207A Unspecified tear of unspecified meniscus, current injury, left knee, initial encounter: Secondary | ICD-10-CM | POA: Insufficient documentation

## 2022-05-24 ENCOUNTER — Telehealth: Payer: BC Managed Care – PPO | Admitting: Nurse Practitioner

## 2022-05-24 DIAGNOSIS — M25562 Pain in left knee: Secondary | ICD-10-CM

## 2022-05-24 MED ORDER — PREDNISONE 20 MG PO TABS: 40.0000 mg | ORAL_TABLET | Freq: Every day | ORAL | 0 refills | Status: AC

## 2022-05-24 NOTE — Patient Instructions (Signed)
  Kerri Robertson, thank you for joining Chevis Pretty, FNP for today's virtual visit.  While this provider is not your primary care provider (PCP), if your PCP is located in our provider database this encounter information will be shared with them immediately following your visit.   [facility] account gives you access to today's visit and all your visits, tests, and labs performed at [facility] " click here if you don't have a [facility] account or go to [email or web address]  Consent: (Patient) Kerri Robertson provided verbal consent for this virtual visit at the beginning of the encounter.  Current Medications:  Current Outpatient Medications:    predniSONE (DELTASONE) 20 MG tablet, Take 2 tablets (40 mg total) by mouth daily with breakfast for 5 days. 2 po daily for 5 days, Disp: 10 tablet, Rfl: 0   Cholecalciferol (VITAMIN D-3) 25 MCG (1000 UT) CAPS, Take by mouth., Disp: , Rfl:    FEROSUL 325 (65 Fe) MG tablet, Take 1 tablet (325 mg total) by mouth every morning., Disp: 90 tablet, Rfl: 1   Medications ordered in this encounter:  Meds ordered this encounter  Medications   predniSONE (DELTASONE) 20 MG tablet    Sig: Take 2 tablets (40 mg total) by mouth daily with breakfast for 5 days. 2 po daily for 5 days    Dispense:  10 tablet    Refill:  0    Order Specific Question:   Supervising Provider    Answer:   Chase Picket [id number]     *If you need refills on other medications prior to your next appointment, please contact your pharmacy*  Follow-Up: Call back or seek an in-person evaluation if the symptoms worsen or if the condition fails to improve as anticipated.  [place] [phone]  Other Instructions Rest ice Compression wrap elevate   If you have been instructed to have an in-person evaluation today at a local Urgent Care facility, please use the link below. It will take you to a list of all of our  available [facility], including address, phone number and hours of operation. Please do not delay care.  [facility]  If you or a family member do not have a primary care provider, use the link below to schedule a visit and establish care. When you choose a [facility] primary care physician or advanced practice provider, you gain a long-term partner in health. Find a Primary Care Provider  Learn more about [facility] in-office and virtual care options: [facility]

## 2022-05-24 NOTE — Progress Notes (Signed)
Virtual Visit Consent   Kerri Robertson, you are scheduled for a virtual visit with Kerri Hassell Done, FNP, a [facility] provider, today.     Just as with appointments in the office, your consent must be obtained to participate.  Your consent will be active for this visit and any virtual visit you may have with one of our providers in the next 365 days.     If you have a MyChart account, a copy of this consent can be sent to you electronically.  All virtual visits are billed to your insurance company just like a traditional visit in the office.    As this is a virtual visit, video technology does not allow for your provider to perform a traditional examination.  This may limit your provider's ability to fully assess your condition.  If your provider identifies any concerns that need to be evaluated in person or the need to arrange testing (such as labs, EKG, etc.), we will make arrangements to do so.     Although advances in technology are sophisticated, we cannot ensure that it will always work on either your end or our end.  If the connection with a video visit is poor, the visit may have to be switched to a telephone visit.  With either a video or telephone visit, we are not always able to ensure that we have a secure connection.     I need to obtain your verbal consent now.   Are you willing to proceed with your visit today? YES   Kerri Robertson has provided verbal consent on 05/24/2022 for a virtual visit (video or telephone).   Kerri Hassell Done, FNP   Date: 05/24/2022 9:52 AM   Virtual Visit via Video Note   I, Kerri Robertson, connected with Kerri Robertson ([id number], 23-Jan-2001) on 05/24/22 at 10:15 AM EST by a video-enabled telemedicine application and verified that I am speaking with the correct person using two identifiers.  Location: Patient: Virtual Visit Location Patient: Home Provider: Virtual Visit Location Provider: Mobile   I discussed the limitations of  evaluation and management by telemedicine and the availability of in person appointments. The patient expressed understanding and agreed to proceed.    History of Present Illness: Kerri Robertson is a 22 y.o. who identifies as a female who was assigned female at birth, and is being seen today for muscle starin.  HPI: Patient states she went to the Gym n Wednesday. She walked on tread mill. She started having left knee pain.  Knee Pain  The incident occurred 2 days ago. The incident occurred at the gym. There was no injury mechanism. The pain is present in the left knee. The quality of the pain is described as aching. The pain is at a severity of 8/10. The pain is mild. Associated symptoms include an inability to bear weight and a loss of motion. She reports no foreign bodies present. She has tried NSAIDs for the symptoms. The treatment provided moderate relief.    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   Balance problem 05/06/2018   Syncope 05/06/2018   History of cerebrovascular accident 11/18/2017   CP (cerebral palsy), spastic, 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[facility]) 06/25/2017   Anxiety state 06/25/2017   Spasticity 06/25/2017   Allergic rhinitis 10/21/2016   Mild intermittent asthma 10/21/2016    Allergies:  Allergies  Allergen Reactions   Benzodiazepines Nausea  And Vomiting   Lactose Other (See Comments)   Medications:  Current Outpatient Medications:    Cholecalciferol (VITAMIN D-3) 25 MCG (1000 UT) CAPS, Take by mouth., Disp: , Rfl:    FEROSUL 325 (65 Fe) MG tablet, Take 1 tablet (325 mg total) by mouth every morning., Disp: 90 tablet, Rfl: 1  Observations/Objective: Patient is well-developed, well-nourished in no acute distress.  Resting comfortably  at home.  Head is normocephalic, atraumatic.  No labored breathing.  Speech is clear and coherent with logical content.   Patient is alert and oriented at baseline.    Assessment and Plan:  Kerri Robertson in today with chief complaint of muscle strain   1. Acute pain of left knee Rest  Ice  compression wrap Elevate  Meds ordered this encounter  Medications   predniSONE (DELTASONE) 20 MG tablet    Sig: Take 2 tablets (40 mg total) by mouth daily with breakfast for 5 days. 2 po daily for 5 days    Dispense:  10 tablet    Refill:  0    Order Specific Question:   Supervising Provider    Answer:   Chase Picket [id number]       Follow Up Instructions: I discussed the assessment and treatment plan with the patient. The patient was provided an opportunity to ask questions and all were answered. The patient agreed with the plan and demonstrated an understanding of the instructions.  A copy of instructions were sent to the patient via MyChart.  The patient was advised to call back or seek an in-person evaluation if the symptoms worsen or if the condition fails to improve as anticipated.  Time:  I spent 12 minutes with the patient via telehealth technology discussing the above problems/concerns.    Kerri Hassell Done, FNP

## 2022-05-26 ENCOUNTER — Ambulatory Visit: Payer: BC Managed Care – PPO | Admitting: Physical Therapy

## 2022-05-26 DIAGNOSIS — R531 Weakness: Secondary | ICD-10-CM

## 2022-05-26 DIAGNOSIS — G800 Spastic quadriplegic cerebral palsy: Secondary | ICD-10-CM | POA: Diagnosis not present

## 2022-05-26 DIAGNOSIS — R293 Abnormal posture: Secondary | ICD-10-CM

## 2022-05-26 DIAGNOSIS — R278 Other lack of coordination: Secondary | ICD-10-CM

## 2022-05-26 DIAGNOSIS — M6281 Muscle weakness (generalized): Secondary | ICD-10-CM

## 2022-05-26 DIAGNOSIS — I69954 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: Secondary | ICD-10-CM

## 2022-05-26 DIAGNOSIS — R2681 Unsteadiness on feet: Secondary | ICD-10-CM

## 2022-05-26 DIAGNOSIS — R262 Difficulty in walking, not elsewhere classified: Secondary | ICD-10-CM

## 2022-05-26 NOTE — Therapy (Signed)
OUTPATIENT PHYSICAL THERAPY TREATMENT     Patient Name: Kerri Robertson MRN: [id number] DOB:2000-11-18, 22 y.o., female Today's Date: 04/10/2022   END OF SESSION:  PT End of Session - 05/26/22 0851     Visit Number 7    Date for PT Re-Evaluation 06/19/22    PT Start Time 0850    PT Stop Time 0930    PT Time Calculation (min) 40 min    Activity Tolerance Patient tolerated treatment well    Behavior During Therapy [facility] for tasks assessed/performed                   Past Medical History:  Diagnosis Date   Allergy     Cerebral palsy ([name])     Stroke [facility])           Past Surgical History:  Procedure Laterality Date   ARM SURGERY        2013   FOOT SURGERY        2010   HAMSTRING LENGTHENING       TIBIA / FIBIA LENGTHENING            Patient Active Problem List    Diagnosis Date Noted   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   Balance problem 05/06/2018   Syncope 05/06/2018   History of cerebrovascular accident 11/18/2017   CP (cerebral palsy), spastic, 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[name]) 06/25/2017   Anxiety state 06/25/2017   Spasticity 06/25/2017   Allergic rhinitis 10/21/2016   Mild intermittent asthma 10/21/2016      PCP: Clayborne Dana, NP   REFERRING PROVIDER: Clayborne Dana, NP   REFERRING DIAG: G80.0 (ICD-10-CM) - CP (cerebral palsy), spastic, 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[name])    THERAPY DIAG:  Spastic quadriplegic cerebral palsy ([name])   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 Pt states it would get better/worse throughout the weekend. Pt states she felt it most in her knee cap. Pt has been doing RICE at home. Pt notes L heel cramping and eventually falls asleep while using the bathroom. Pt notes 3-4 falls ~2 weeks ago -- L leg was giving out on her. Pt states she was getting out of bed and had a fall.    PERTINENT HISTORY:     CP (cerebral palsy), spastic, 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[name]) - Primary         She is stable, at baseline (ambulatory, but with with some gait abnormalities, balance issues, and weakness). Rechecking CBC, iron, and vitamin D today. She has been on supplementation (refilling iron for her). Will make additional changes based on labs if indicated. PT referral placed to [facility] at her request. No acute concerns today.      PAIN:  Are you having pain? No   PRECAUTIONS: None   WEIGHT BEARING RESTRICTIONS: No   FALLS:  Has patient fallen in last 6 months? Yes. 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   NEXT MD VISIT:    OBJECTIVE:    DIAGNOSTIC FINDINGS:  Radiology consult confirms lumbar scoliosis with apex at L2-3, but since patient is asymptomatic, recommend monitoring it and continue to strengthen.           COGNITION: Overall cognitive status: Within functional limits for tasks assessed                         SENSATION: Not tested     MUSCLE LENGTH: Hamstrings: Right 40 deg; Left 30 deg     POSTURE: left pelvic obliquity, weight shift right, and hips rotated to L with L LE held in IR, increased R WB      LOWER EXTREMITY ROM: Trunk lateraly flexed due to scoliosis, L hip unable to achieve neutral ER, abd limited, B hamstrings tight, L ankle does not achieve neutral DF or inversion. LUE with limited ROM at all joints, in all planes. she wears a splint on her wrist at night.      LOWER EXTREMITY MMT: RLE 4/5, LLE with fluctuating tone throughout, HS very weak, hips also weak-MMT accuracy questionable due to her tone.   FUNCTIONAL TESTS:  5 times sit to stand: 14.77 Timed up and go (TUG): 13.75 Berg Balance Scale: 38   GAIT: Distance walked: clinic distances Assistive device utilized: None Level of assistance: Modified independence Comments: Step-to pattern;Decreased step length - left;Decreased stance time - left;Decreased weight shift to left;Shuffle;Ataxic;Lateral hip instability;Trunk rotated posteriorly on left      TODAY'S TREATMENT:                                                                                                                              DATE:  05/26/22 THEREX Nustep L5 x 5 min Supine butterfly 2x30 sec Supine Knee extended hip ER x20 Supine bridge with clamshell red TB 2x10 S/L clamshell AAROM x30 S/L Hip ext AAROM x30 Prone hamstring curl x30 Prone hip ER knee bent iso hold 10x3 sec SELF CARE Discussed appropriate knee brace for improved patellar alignment with gait. Discussed stretching while in the bathroom to keep back of heel from cramping and to decrease any N/T into L foot.    05/07/22 NuStep L5 x 6 minutes Sit to stand on Air ex pad with red tband resistance for hip abd, 10 reps Resisted gait with 10#, x 4 in each direction. Standing reach/pull with RUE into horizontal abd, then horiz add, 10 reps each, including rotation and weight shift onto LLE.  Standing with back to wall, red physioball positioned in lower back, squat slightly and maintain while rotating trunk R/L 2 x 10  04/24/22 NuStep L5  x 6 minutes Supine legs over 65 cm physioball-  rolled DKTC, B bridge, U bridge with R, then L leg, LTR Supine with LLE pushed against red physioball, min A for positioning, push and hold x 10 sec, 10 reps. Sit to stand from elevated mat, LLE slightly behind R and therapist facilitating weight shift onto LLE. Standing alternate taps on 4" step, min A for balance and facilitation of LLE in stance. Place LLE on 4" step, rotate to R while maintaining LLE in position to stretch medial L hip.   04/21/22 Nustep Level 4 x 6 minutes Leg press 20# 2x10, left only 4x5 Bridges ball b/n knees 2x10 HS, piriformis and adductor stretch on the left Crunches 3x6 Lower trunk rotations Feet on ball trunk rotations Feet on ball bridges 4" toe touches holding rail and then without Side stepping over objects Side step on and off airex holding and without On airex volleyball,    04/18/22 Bike L3 x 6 minutes Leg press 10 reps with BLE, 10 with LLE, 20#, emphasis on neutral hip rotation on L, mod A for the positioning. Seated hip abd against yellow Tband, able to hold LLE, but no true abd. 20 reps U paloff with RUE, yellow Tband, 2 x 10 reps, min TC to wt shift onto LLE and stabilize on L B side stepping over lines on the floor, x8 in each direction-Initially had much difficulty with balance, improved to speed up with less assist. Sit <> stand x 10, therapist facilitated wt shift onto LLE.   04/16/22 NuStep L4 x 6 minutes. Supine clamshells against yellow Tband Supine bridge with yellow Tband resistance Standing/step with RLE forward and back, emphasize LLE position in stance, tends to hold leg in IR. Standing step back and out with RLE to improve L hip ER. Ambulated 1 x 80' emphasizing tall posture over LLE and neutral rotation at L hip in WB. Sit <> stand from elevated mat with mod facilitation to maintain WB through LLE. 10 reps  04/10/22   education   PATIENT EDUCATION:  Education details: POC Person educated: Patient and Caregiver    Education method: Explanation Education comprehension: verbalized understanding   HOME EXERCISE PROGRAM: [id number]   ASSESSMENT:   CLINICAL IMPRESSION: Problem solved with pt and caregiver in regards to some of her issues at home. Has had recent falls. Worked on continuing to improve glute activation to decrease knee valgus. Worked on stretching adductors and hip IR.    OBJECTIVE IMPAIRMENTS: Abnormal gait, decreased balance, decreased coordination, decreased endurance, decreased mobility, difficulty walking, decreased ROM, decreased strength, increased fascial restrictions, increased muscle spasms, impaired flexibility, impaired tone, improper body mechanics, and postural dysfunction.    ACTIVITY LIMITATIONS: standing, squatting, stairs, transfers, and locomotion level   PARTICIPATION LIMITATIONS: shopping, community activity, and occupation   PERSONAL FACTORS: 1 comorbidity: CP  are also affecting patient's functional outcome.    REHAB POTENTIAL: Good   CLINICAL DECISION MAKING: Stable/uncomplicated   EVALUATION COMPLEXITY: Moderate     GOALS: Goals reviewed with patient? Yes   SHORT TERM GOALS: Target date: 05/01/22 I with initial HEP Baseline: Goal status: ongoing   LONG TERM GOALS: Target date: 06/19/22   I with final HEP Baseline:  Goal status: INITIAL   2.  Improve TUG time to < 12 sec Baseline:  Goal status: ongoing  3.  Improve 5 x STS to <12 Baseline:  Goal status: INITIAL   4.  Increase BERG score to at least 44 Baseline:  Goal status:  INITIAL   5.  Patient will ambulate with normalized gait pattern, minimizing abnormal weight shifts and increasing LLE position to more neutral hip rotation. Baseline:  Goal status: INITIAL     PLAN:   PT FREQUENCY: 1-2x/week   PT DURATION: 10 weeks   PLANNED INTERVENTIONS: Therapeutic exercises, Therapeutic activity, Neuromuscular re-education, Balance training, Gait training, Patient/Family education, Self  Care, Joint mobilization, Dry Needling, Cryotherapy, Moist heat, Ionotophoresis 4mg /ml Dexamethasone, and Manual therapy   PLAN FOR NEXT SESSION: HEP, HS strength, L Hip stretch into ER     Rogers Ditter April Beatrix Shipper Anastasiya Gowin, PT 05/26/22 8:52 AM

## 2022-05-27 NOTE — Therapy (Signed)
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   Balance problem 05/06/2018   Syncope 05/06/2018   History of cerebrovascular accident 11/18/2017   CP (cerebral palsy), spastic, 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[name]) 06/25/2017   Anxiety state 06/25/2017   Spasticity 06/25/2017   Allergic rhinitis 10/21/2016   Mild intermittent asthma 10/21/2016      PCP: Clayborne Dana, NP   REFERRING PROVIDER: Clayborne Dana, NP   REFERRING DIAG: G80.0 (ICD-10-CM) - CP (cerebral palsy), spastic, 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[name])    THERAPY DIAG:  Spastic quadriplegic cerebral palsy ([name])   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 Pt states it would get better/worse throughout the weekend. Pt states she felt it most in her knee cap. Pt has been doing RICE at home. Pt notes L heel cramping and eventually falls asleep while using the bathroom. Pt notes 3-4 falls ~2  weeks ago -- L leg was giving out on her. Pt states she was getting out of bed and had a fall.    PERTINENT HISTORY:     CP (cerebral palsy), spastic, 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[name]) - Primary         She is stable, at baseline (ambulatory, but with with some gait abnormalities, balance issues, and weakness). Rechecking CBC, iron, and vitamin D today. She has been on supplementation (refilling iron for her). Will make additional changes based on labs if indicated. PT referral placed to [facility] at her request. No acute concerns today.      PAIN:  Are you having pain? No   PRECAUTIONS: None   WEIGHT BEARING RESTRICTIONS: No   FALLS:  Has patient fallen in last 6 months? Yes. 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 Hobbies- Runner, broadcasting/film/video, archery, baseball, tennis, dance    PLOF: Needs assistance with ADLs   PATIENT GOALS: Patient would like to improve her flexibility and balance.   NEXT MD VISIT:    OBJECTIVE:    DIAGNOSTIC FINDINGS:  Radiology consult confirms lumbar scoliosis with apex at L2-3, but since patient is  asymptomatic, recommend monitoring it and continue to strengthen.           COGNITION: Overall cognitive status: Within functional limits for tasks assessed                         SENSATION: Not tested     MUSCLE LENGTH: Hamstrings: Right 40 deg; Left 30 deg     POSTURE: left pelvic obliquity, weight shift right, and hips rotated to L with L LE held in IR, increased R WB     LOWER EXTREMITY ROM: Trunk lateraly flexed due to scoliosis, L hip unable to achieve neutral ER, abd limited, B hamstrings tight, L ankle does not achieve neutral DF or inversion. LUE with limited ROM at all joints, in all planes. she wears a splint on her wrist at night.      LOWER EXTREMITY  MMT: RLE 4/5, LLE with fluctuating tone throughout, HS very weak, hips also weak-MMT accuracy questionable due to her tone.   FUNCTIONAL TESTS:  5 times sit to stand: 14.77 Timed up and go (TUG): 13.75 Berg Balance Scale: 38   GAIT: Distance walked: clinic distances Assistive device utilized: None Level of assistance: Modified independence Comments: Step-to pattern;Decreased step length - left;Decreased stance time - left;Decreased weight shift to left;Shuffle;Ataxic;Lateral hip instability;Trunk rotated posteriorly on left      TODAY'S TREATMENT:                                                                                                                              DATE:  05/26/22 THEREX Nustep L5 x 5 min Supine butterfly 2x30 sec Supine Knee extended hip ER x20 Supine bridge with clamshell red TB 2x10 S/L clamshell AAROM x30 S/L Hip ext AAROM x30 Prone hamstring curl x30 Prone hip ER knee bent iso hold 10x3 sec SELF CARE Discussed appropriate knee brace for improved patellar alignment with gait. Discussed stretching while in the bathroom to keep back of heel from cramping and to decrease any N/T into L foot.    05/07/22 NuStep L5 x 6 minutes Sit to stand on Air ex pad with red tband resistance for hip abd, 10 reps Resisted gait with 10#, x 4 in each direction. Standing reach/pull with RUE into horizontal abd, then horiz add, 10 reps each, including rotation and weight shift onto LLE.  Standing with back to wall, red physioball positioned in lower back, squat slightly and maintain while rotating trunk R/L 2 x 10  04/24/22 NuStep L5 x 6 minutes Supine legs over 65 cm physioball- rolled DKTC, B bridge, U bridge with R, then L leg, LTR Supine with LLE pushed against red physioball, min A for positioning, push and hold x 10 sec, 10 reps. Sit to stand from elevated mat, LLE slightly behind R and therapist facilitating weight shift onto LLE. Standing alternate taps on 4" step,  min A for balance and facilitation of LLE in stance.  Place LLE on 4" step, rotate to R while maintaining LLE in position to stretch medial L hip.   04/21/22 Nustep Level 4 x 6 minutes Leg press 20# 2x10, left only 4x5 Bridges ball b/n knees 2x10 HS, piriformis and adductor stretch on the left Crunches 3x6 Lower trunk rotations Feet on ball trunk rotations Feet on ball bridges 4" toe touches holding rail and then without Side stepping over objects Side step on and off airex holding and without On airex volleyball,    04/18/22 Bike L3 x 6 minutes Leg press 10 reps with BLE, 10 with LLE, 20#, emphasis on neutral hip rotation on L, mod A for the positioning. Seated hip abd against yellow Tband, able to hold LLE, but no true abd. 20 reps U paloff with RUE, yellow Tband, 2 x 10 reps, min TC to wt shift onto LLE and stabilize on L B side stepping over lines on the floor, x8 in each direction-Initially had much difficulty with balance, improved to speed up with less assist. Sit <> stand x 10, therapist facilitated wt shift onto LLE.   04/16/22 NuStep L4 x 6 minutes. Supine clamshells against yellow Tband Supine bridge with yellow Tband resistance Standing/step with RLE forward and back, emphasize LLE position in stance, tends to hold leg in IR. Standing step back and out with RLE to improve L hip ER. Ambulated 1 x 80' emphasizing tall posture over LLE and neutral rotation at L hip in WB. Sit <> stand from elevated mat with mod facilitation to maintain WB through LLE. 10 reps  04/10/22   education   PATIENT EDUCATION:  Education details: POC Person educated: Patient and Caregiver   Education method: Explanation Education comprehension: verbalized understanding   HOME EXERCISE PROGRAM: [id number]   ASSESSMENT:   CLINICAL IMPRESSION: Problem solved with pt and caregiver in regards to some of her issues at home. Has had recent falls. Worked on continuing to improve glute  activation to decrease knee valgus. Worked on stretching adductors and hip IR.    OBJECTIVE IMPAIRMENTS: Abnormal gait, decreased balance, decreased coordination, decreased endurance, decreased mobility, difficulty walking, decreased ROM, decreased strength, increased fascial restrictions, increased muscle spasms, impaired flexibility, impaired tone, improper body mechanics, and postural dysfunction.    ACTIVITY LIMITATIONS: standing, squatting, stairs, transfers, and locomotion level   PARTICIPATION LIMITATIONS: shopping, community activity, and occupation   PERSONAL FACTORS: 1 comorbidity: CP  are also affecting patient's functional outcome.    REHAB POTENTIAL: Good   CLINICAL DECISION MAKING: Stable/uncomplicated   EVALUATION COMPLEXITY: Moderate     GOALS: Goals reviewed with patient? Yes   SHORT TERM GOALS: Target date: 05/01/22 I with initial HEP Baseline: Goal status: ongoing   LONG TERM GOALS: Target date: 06/19/22   I with final HEP Baseline:  Goal status: INITIAL   2.  Improve TUG time to < 12 sec Baseline:  Goal status: ongoing  3.  Improve 5 x STS to <12 Baseline:  Goal status: INITIAL   4.  Increase BERG score to at least 44 Baseline:  Goal status: INITIAL   5.  Patient will ambulate with normalized gait pattern, minimizing abnormal weight shifts and increasing LLE position to more neutral hip rotation. Baseline:  Goal status: INITIAL     PLAN:   PT FREQUENCY: 1-2x/week   PT DURATION: 10 weeks   PLANNED INTERVENTIONS: Therapeutic exercises, Therapeutic activity, Neuromuscular re-education, Balance training, Gait training, Patient/Family education, Self Care, Joint mobilization, Dry Needling, Cryotherapy, Moist heat, Ionotophoresis 4mg /ml Dexamethasone,  and Manual therapy   PLAN FOR NEXT SESSION: HEP, HS strength, L Hip stretch into ER     Donita Brooks, PT 05/27/22 5:44 PM

## 2022-05-28 ENCOUNTER — Ambulatory Visit: Payer: BC Managed Care – PPO | Admitting: Physical Therapy

## 2022-05-28 ENCOUNTER — Encounter: Payer: Self-pay | Admitting: Physical Therapy

## 2022-05-28 DIAGNOSIS — R262 Difficulty in walking, not elsewhere classified: Secondary | ICD-10-CM

## 2022-05-28 DIAGNOSIS — R2681 Unsteadiness on feet: Secondary | ICD-10-CM

## 2022-05-28 DIAGNOSIS — I69954 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: Secondary | ICD-10-CM

## 2022-05-28 DIAGNOSIS — R278 Other lack of coordination: Secondary | ICD-10-CM

## 2022-05-28 DIAGNOSIS — R531 Weakness: Secondary | ICD-10-CM

## 2022-05-28 DIAGNOSIS — G800 Spastic quadriplegic cerebral palsy: Secondary | ICD-10-CM | POA: Diagnosis not present

## 2022-05-28 DIAGNOSIS — R293 Abnormal posture: Secondary | ICD-10-CM

## 2022-05-28 DIAGNOSIS — M6281 Muscle weakness (generalized): Secondary | ICD-10-CM

## 2022-05-30 ENCOUNTER — Ambulatory Visit: Payer: BC Managed Care – PPO

## 2022-05-30 ENCOUNTER — Emergency Department (HOSPITAL_BASED_OUTPATIENT_CLINIC_OR_DEPARTMENT_OTHER): Payer: BC Managed Care – PPO

## 2022-05-30 ENCOUNTER — Encounter (HOSPITAL_BASED_OUTPATIENT_CLINIC_OR_DEPARTMENT_OTHER): Payer: Self-pay | Admitting: Emergency Medicine

## 2022-05-30 ENCOUNTER — Other Ambulatory Visit: Payer: Self-pay

## 2022-05-30 DIAGNOSIS — R519 Headache, unspecified: Secondary | ICD-10-CM | POA: Insufficient documentation

## 2022-05-30 DIAGNOSIS — Z1152 Encounter for screening for COVID-19: Secondary | ICD-10-CM | POA: Insufficient documentation

## 2022-05-30 NOTE — ED Triage Notes (Signed)
Right sided headache that started around 1730 today. Headache resolved then returned around 8 pm associated with n/v and light sensitivity. Denies hx of headache. Denies other associated sx. Mom reports pt has a hx of cerebral palsy and headache is on the same side as "the damaged brain"

## 2022-05-31 ENCOUNTER — Emergency Department (HOSPITAL_BASED_OUTPATIENT_CLINIC_OR_DEPARTMENT_OTHER)
Admission: EM | Admit: 2022-05-31 | Discharge: 2022-05-31 | Disposition: A | Discharge status: Home / Self Care | Payer: BC Managed Care – PPO | Attending: Emergency Medicine | Admitting: Emergency Medicine

## 2022-05-31 ENCOUNTER — Encounter (HOSPITAL_BASED_OUTPATIENT_CLINIC_OR_DEPARTMENT_OTHER): Payer: Self-pay

## 2022-05-31 ENCOUNTER — Emergency Department (HOSPITAL_BASED_OUTPATIENT_CLINIC_OR_DEPARTMENT_OTHER): Payer: BC Managed Care – PPO

## 2022-05-31 ENCOUNTER — Other Ambulatory Visit: Payer: Self-pay

## 2022-05-31 DIAGNOSIS — R519 Headache, unspecified: Secondary | ICD-10-CM

## 2022-05-31 LAB — BASIC METABOLIC PANEL
Anion gap: 9 (ref 5–15)
BUN: 15 mg/dL (ref 6–20)
CO2: 25 mmol/L (ref 22–32)
Calcium: 9.6 mg/dL (ref 8.9–10.3)
Chloride: 102 mmol/L (ref 98–111)
Creatinine, Ser: 0.52 mg/dL (ref 0.44–1.00)
GFR, Estimated: >60 mL/min (ref >60)
Glucose, Bld: 118 mg/dL — ABNORMAL HIGH (ref 70–99)
Potassium: 3.9 mmol/L (ref 3.5–5.1)
Sodium: 136 mmol/L (ref 135–145)

## 2022-05-31 LAB — RESP PANEL BY RT-PCR (RSV, FLU A&B, COVID)  RVPGX2
Influenza A by PCR: NEGATIVE
Influenza B by PCR: NEGATIVE
Resp Syncytial Virus by PCR: NEGATIVE
SARS Coronavirus 2 by RT PCR: NEGATIVE

## 2022-05-31 LAB — GROUP A STREP BY PCR: Group A Strep by PCR: NOT DETECTED

## 2022-05-31 LAB — PREGNANCY, URINE: Preg Test, Ur: NEGATIVE

## 2022-05-31 MED ORDER — SODIUM CHLORIDE 0.9 % IV BOLUS
500.0000 mL | Freq: Once | INTRAVENOUS | Status: AC
  Administered 2022-05-31: 500 mL via INTRAVENOUS

## 2022-05-31 MED ORDER — ONDANSETRON 4 MG PO TBDP
4.0000 mg | ORAL_TABLET | Freq: Once | ORAL | Status: AC
  Administered 2022-05-31: 4 mg via ORAL
  Filled 2022-05-31: qty 1

## 2022-05-31 MED ORDER — IOHEXOL 350 MG/ML SOLN
75.0000 mL | Freq: Once | INTRAVENOUS | Status: AC | PRN
  Administered 2022-05-31: 75 mL via INTRAVENOUS

## 2022-05-31 MED ORDER — MAGNESIUM SULFATE 2 GM/50ML IV SOLN
2.0000 g | Freq: Once | INTRAVENOUS | Status: AC
  Administered 2022-05-31: 2 g via INTRAVENOUS
  Filled 2022-05-31: qty 50

## 2022-05-31 MED ORDER — DIPHENHYDRAMINE HCL 50 MG/ML IJ SOLN
12.5000 mg | Freq: Once | INTRAMUSCULAR | Status: AC
  Administered 2022-05-31: 12.5 mg via INTRAVENOUS
  Filled 2022-05-31: qty 1

## 2022-05-31 MED ORDER — METOCLOPRAMIDE HCL 5 MG/ML IJ SOLN
10.0000 mg | Freq: Once | INTRAMUSCULAR | Status: AC
  Administered 2022-05-31: 10 mg via INTRAVENOUS
  Filled 2022-05-31: qty 2

## 2022-05-31 NOTE — ED Notes (Signed)
Spoke with pharmacy in regards to medications that may affect the patient (Cerebral palsy). Mother reports she was told at some point that she cannot have benadryl due to it crossing the blood brain barrier. Pharmacy reports it is okay for patient to receive these medications; MD Palumbo also agrees.

## 2022-05-31 NOTE — ED Provider Notes (Signed)
[facility] Provider Note   CSN: [id number] Arrival date & time: 05/30/22  2251     History  Chief Complaint  Patient presents with   Headache    Kerri Robertson is a 22 y.o. female.  The history is provided by the patient.  Headache Pain location:  R temporal Radiates to:  Does not radiate Timing:  Constant Progression:  Resolved Context: not straining   Relieved by:  Nothing Worsened by:  Nothing Ineffective treatments:  None tried Associated symptoms: nausea and vomiting   Associated symptoms: no cough, no fever, no neck stiffness, no paresthesias, no syncope and no weakness   Patient with cerebral palsy presents with intermittent headaches today and associated nausea and emesis.  Patient is pain free at the time of evaluation.       Home Medications Prior to Admission medications   Medication Sig Start Date End Date Taking? Authorizing Provider  Cholecalciferol (VITAMIN D-3) 25 MCG (1000 UT) CAPS Take by mouth.    [provider]  FEROSUL 325 (65 Fe) MG tablet Take 1 tablet (325 mg total) by mouth every morning. 03/17/22   Terrilyn Saver, NP      Allergies    Benzodiazepines, Lactose, and Latex    Review of Systems   Review of Systems  Constitutional:  Negative for fever.  HENT:  Negative for facial swelling.   Eyes:  Negative for visual disturbance.  Respiratory:  Negative for cough.   Cardiovascular:  Negative for syncope.  Gastrointestinal:  Positive for nausea and vomiting.  Musculoskeletal:  Negative for neck stiffness.  Skin:  Negative for rash.  Neurological:  Positive for headaches. Negative for speech difficulty, weakness and paresthesias.  All other systems reviewed and are negative.   Physical Exam Updated Vital Signs BP 111/84   Pulse 100   Temp 98 F (36.7 C) (Oral)   Resp 18   Ht 5\' 1"  (1.549 m)   Wt 50.8 kg   LMP 05/21/2022 (Approximate)   SpO2 95%   BMI 21.16 kg/m  Physical  Exam Vitals and nursing note reviewed.  Constitutional:      General: She is not in acute distress.    Appearance: Normal appearance. She is well-developed.  HENT:     Head: Normocephalic and atraumatic.     Nose: Nose normal.     Mouth/Throat:     Mouth: Mucous membranes are moist.     Pharynx: Oropharynx is clear.  Eyes:     Extraocular Movements: Extraocular movements intact.     Pupils: Pupils are equal, round, and reactive to light.     Comments: Disks sharp B  Neck:     Vascular: No carotid bruit.  Cardiovascular:     Rate and Rhythm: Normal rate and regular rhythm.     Pulses: Normal pulses.     Heart sounds: Normal heart sounds.  Pulmonary:     Effort: Pulmonary effort is normal. No respiratory distress.     Breath sounds: Normal breath sounds.  Abdominal:     General: Bowel sounds are normal. There is no distension.     Palpations: Abdomen is soft.     Tenderness: There is no abdominal tenderness. There is no guarding or rebound.  Genitourinary:    Vagina: No vaginal discharge.  Musculoskeletal:        General: Normal range of motion.     Cervical back: Normal range of motion and neck supple. No rigidity.  Lymphadenopathy:     Cervical: No cervical adenopathy.  Skin:    General: Skin is warm and dry.     Capillary Refill: Capillary refill takes less than 2 seconds.     Findings: No erythema or rash.  Neurological:     General: No focal deficit present.     Mental Status: She is alert and oriented to person, place, and time.     Deep Tendon Reflexes: Reflexes normal.  Psychiatric:        Mood and Affect: Mood normal.     ED Results / Procedures / Treatments   Labs (all labs ordered are listed, but only abnormal results are displayed) Labs Reviewed  BASIC METABOLIC PANEL - Abnormal; Notable for the following components:      Result Value   Glucose, Bld 118 (*)    All other components within normal limits  RESP PANEL BY RT-PCR (RSV, FLU A&B, COVID)   RVPGX2  GROUP A STREP BY PCR  PREGNANCY, URINE    EKG None  Radiology DG Knee Complete 4 Views Left  Result Date: 05/31/2022 CLINICAL DATA:  Felt knee pop while on treadmill a few hours ago with subsequent pain EXAM: LEFT KNEE - COMPLETE 4+ VIEW COMPARISON:  04/17/2016 FINDINGS: No acute fracture or dislocation. Trace knee joint effusion. No significant arthropathy. Soft tissues are unremarkable. IMPRESSION: No acute fracture or dislocation. Electronically Signed   By: Minerva Fester M.D.   On: 05/31/2022 02:58   CT Head Wo Contrast  Result Date: 05/30/2022 CLINICAL DATA:  Altered mental status, right-sided headaches EXAM: CT HEAD WITHOUT CONTRAST TECHNIQUE: Contiguous axial images were obtained from the base of the skull through the vertex without intravenous contrast. RADIATION DOSE REDUCTION: This exam was performed according to the departmental dose-optimization program which includes automated exposure control, adjustment of the mA and/or kV according to patient size and/or use of iterative reconstruction technique. COMPARISON:  MRI from 03/15/2020 FINDINGS: Brain: There is cystic encephalomalacia identified in almost the entire right cerebral hemisphere. These changes are stable from the prior MRI examination. No findings to suggest acute hemorrhage, acute infarction or space-occupying mass lesion are seen. Vascular: No hyperdense vessel or unexpected calcification. Skull: Normal. Negative for fracture or focal lesion. Sinuses/Orbits: No acute finding. Other: None. 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 No acute abnormality is noted. Electronically Signed   By: Alcide Clever M.D.   On: 05/30/2022 23:42    Procedures Procedures    Medications Ordered in ED Medications  magnesium sulfate IVPB 2 g 50 mL (has no administration in time range)  metoCLOPramide (REGLAN) injection 10 mg (has no administration in time range)  ondansetron  (ZOFRAN-ODT) disintegrating tablet 4 mg (4 mg Oral Given 05/31/22 0238)  iohexol (OMNIPAQUE) 350 MG/ML injection 75 mL (75 mLs Intravenous Contrast Given 05/31/22 0446)  diphenhydrAMINE (BENADRYL) injection 12.5 mg (12.5 mg Intravenous Given 05/31/22 0445)  sodium chloride 0.9 % bolus 500 mL (500 mLs Intravenous New Bag/Given 05/31/22 0426)    ED Course/ Medical Decision Making/ A&P                             Medical Decision Making Patient with right sided headaches intermittent with associated nausea and emesis.    Amount and/or Complexity of Data Reviewed Independent Historian: spouse    Details: See above  Labs: ordered.    Details: Negative covid and flu.  Negative strep.  Negative  pregnancy test.  Normal creatinine  Radiology: ordered and independent interpretation performed.    Details: No acute finding by me on CT, no aneurysms on CTA  Risk Prescription drug management. Risk Details: Pain returned during ED stay.  Treated in the ED with migraine cocktail.  Very well appearing.  Sleeping soundly post medication. ICH and aneurysm have been ruled out in the ED.   I do not believe this is meningitis.  Patient is very well appearing with supple neck and no fevers, no rashes.  Stable for discharge strict return.      Final Clinical Impression(s) / ED Diagnoses Return for intractable cough, coughing up blood, fevers > 100.4 unrelieved by medication, shortness of breath, intractable vomiting, chest pain, shortness of breath, weakness, numbness, changes in speech, facial asymmetry, abdominal pain, passing out, Inability to tolerate liquids or food, cough, altered mental status or any concerns. No signs of systemic illness or infection. The patient is nontoxic-appearing on exam and vital signs are within normal limits.  I have reviewed the triage vital signs and the nursing notes. Pertinent labs & imaging results that were available during my care of the patient were reviewed by me and  considered in my medical decision making (see chart for details). After history, exam, and medical workup I feel the patient has been appropriately medically screened and is safe for discharge home. Pertinent diagnoses were discussed with the patient. Patient was given return precautions.    Haylo Fake, MD 05/31/22 [phone]

## 2022-05-31 NOTE — ED Notes (Signed)
Walked into pt room and pt was coughing and vomiting. Pt endorsing HA and photophobia. MD Palumbo made aware.

## 2022-05-31 NOTE — ED Notes (Signed)
PIV attempt x 2 unsuccessful.

## 2022-06-01 ENCOUNTER — Encounter: Payer: Self-pay | Admitting: Family Medicine

## 2022-06-02 ENCOUNTER — Encounter: Payer: Self-pay | Admitting: Family Medicine

## 2022-06-02 ENCOUNTER — Telehealth: Payer: Self-pay

## 2022-06-02 ENCOUNTER — Ambulatory Visit (INDEPENDENT_AMBULATORY_CARE_PROVIDER_SITE_OTHER): Payer: BC Managed Care – PPO | Admitting: Family Medicine

## 2022-06-02 ENCOUNTER — Telehealth: Payer: Self-pay | Admitting: Family Medicine

## 2022-06-02 VITALS — BP 129/82 | HR 94 | Temp 97.6°F | Resp 16 | Ht 61.0 in | Wt 115.0 lb

## 2022-06-02 DIAGNOSIS — M25562 Pain in left knee: Secondary | ICD-10-CM

## 2022-06-02 DIAGNOSIS — R110 Nausea: Secondary | ICD-10-CM

## 2022-06-02 DIAGNOSIS — R519 Headache, unspecified: Secondary | ICD-10-CM

## 2022-06-02 NOTE — Telephone Encounter (Signed)
Nurse Assessment Nurse: Rose Phi, RN, [place] Date/Time (Eastern Time): 05/30/2022 10:00:34 PM Confirm and document reason for call. If symptomatic, describe symptoms. ---Caller states her daughter has a headache and vomiting with no blood. She has CP. With her CP, she has right sided brain damage. Her headache is on her right side of her head. Caller states she has never had a headache before. Headache started this afternoon, but is worse now. Does the patient have any new or worsening symptoms? ---Yes Will a triage be completed? ---Yes Related visit to physician within the last 2 weeks? ---No Does the PT have any chronic conditions? (i.e. diabetes, asthma, this includes High risk factors for pregnancy, etc.) ---Yes List chronic conditions. ---CP Is the patient pregnant or possibly pregnant? (Ask all females between the ages of 6-55) ---No Is this a behavioral health or substance abuse call? ---No Guidelines Guideline Title Affirmed Question Affirmed Notes Nurse Date/Time (Eastern Time) Headache [1] SEVERE headache AND [2] sudden-onset (i.e., reaching maximum [place], RN, [place] 05/30/2022 10:04:25 PM PLEASE NOTE: All timestamps contained within this report are represented as [place] Standard Time. CONFIDENTIALTY NOTICE: This fax transmission is intended only for the addressee. It contains information that is legally privileged, confidential or otherwise protected from use or disclosure. If you are not the intended recipient, you are strictly prohibited from reviewing, disclosing, copying using or disseminating any of this information or taking any action in reliance on or regarding this information. If you have received this fax in error, please notify us immediately by telephone so that we can arrange for its return to [place]. Phone: [phone], Toll-Free: [phone], Fax: [phone] Page: 2 of 2 Call Id: [id number] Guidelines Guideline Title Affirmed Question Affirmed  Notes Nurse Date/Time Eilene Ghazi Time) intensity within seconds to 1 hour) Disp. Time Eilene Ghazi Time) Disposition Final User 05/30/2022 10:08:02 PM Go to ED Now (or PCP triage) Yes Rose Phi, [place] Final Disposition 05/30/2022 10:08:02 PM Go to ED Now (or PCP triage) Yes [place], RN, [place] Caller Disagree/Comply Comply Caller Understands Yes PreDisposition Call Doctor Care Advice Given Per Guideline GO TO ED NOW (OR PCP TRIAGE): * IF NO PCP (PRIMARY CARE PROVIDER) SECOND-LEVEL TRIAGE: You need to be seen within the next hour. Go to the [facility] at _____________ [place] as soon as you can. ANOTHER ADULT SHOULD DRIVE: * It is better and safer if another adult drives instead of you. CARE ADVICE given per Headache (Adult) guideline. Referrals [facility] - ED

## 2022-06-02 NOTE — Telephone Encounter (Signed)
Patient's mom, Kerri Robertson, calling to request callback from Caleen Jobs regarding daughter's appt. Mom is on DPR. Callback # [phone]

## 2022-06-02 NOTE — Progress Notes (Signed)
Acute Office Visit  Subjective:     Patient ID: Kerri Robertson, female    DOB: December 24, 2000, 22 y.o.   MRN: [id number]  Chief Complaint  Patient presents with   Referral    Left knee and neurology    HPI Patient is in today for left knee pain and new headache.   Left knee pain: Patient reports that about 2 weeks ago she was doing a [employer] treadmill workout and believes she overdid it.  She went longer and faster than her baseline and during the workout she started noticing some left anterior knee discomfort.  By the next day she was in significant pain and had trouble walking.  Patient/caregiver states that she is not able to walk more than 200 feet without being in pain.  She has not noticed any swelling or bruising, popping, clicking, locking.  However she does feel like the knee is unstable and may give out on her.  Pain is primarily anterior but notes occasional posterior discomfort.  At rest pain is 0/10, but with prolonged walking or standing pain can get up to 5/10.  She was in the ED over the weekend for a different concern but they went ahead and got an x-ray which was normal.  Mom was on speaker phone during the visit and believes that she needs an MRI.  New type headache: Patient/caregiver report that over the weekend she developed a significant right-sided headache.  She ended up going to the emergency room and improved after getting a migraine cocktail.  CT scans were negative.  Patient/mom is concerned because she has never had a headache like this before.  Reports history of seizure-like activity on scans, but she has never had a seizure.  States previous activity they had been monitoring was right sided, so they are concerned that she has now had a significant headache on this side as well. 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  Patient states she has been feeling well since the  episode.  She may have had a slight headache the following day, but she has not had any more symptoms.  She denies any vision changes, nausea, weakness, slurred speech, confusion, neck pain.  They are requesting a neuro referral given her history.    ROS All review of systems negative except what is listed in the HPI      Objective:    BP 129/82   Pulse 94   Temp 97.6 F (36.4 C)   Resp 16   Ht 5\' 1"  (1.549 m)   Wt 115 lb (52.2 kg)   LMP 05/21/2022 (Approximate)   SpO2 100%   BMI 21.73 kg/m    Physical Exam Vitals reviewed.  Constitutional:      General: She is not in acute distress.    Appearance: Normal appearance. She is normal weight. She is not ill-appearing.  HENT:     Head: Normocephalic and atraumatic.  Cardiovascular:     Rate and Rhythm: Normal rate and regular rhythm.     Pulses: Normal pulses.     Heart sounds: Normal heart sounds.  Pulmonary:     Effort: Pulmonary effort is normal.     Breath sounds: Normal breath sounds.  Musculoskeletal:     Cervical back: Normal range of motion and neck supple. No tenderness.     Right lower leg: No edema.     Left lower leg: No edema.  Comments: LUE mild contracture, LLE mild malalignment; left knee with baseline passive ROM, no pain or instability on anterior/posterior drawer or valgus/varus stress, no crepitus or inflammation  Lymphadenopathy:     Cervical: No cervical adenopathy.  Skin:    General: Skin is warm and dry.  Neurological:     Mental Status: She is alert and oriented to person, place, and time. Mental status is at baseline.     Cranial Nerves: No cranial nerve deficit.  Psychiatric:        Mood and Affect: Mood normal.        Behavior: Behavior normal.        Thought Content: Thought content normal.        Judgment: Judgment normal.     No results found for any visits on 06/02/22.      Assessment & Plan:   Problem List Items Addressed This Visit   None Visit Diagnoses     Acute  pain of left knee    -  Primary Wear the brace with activity Rest, ice, heat, elevation, home stretches Continue PT  Referral to sports medicine to further evaluate    Relevant Orders   Ambulatory referral to Sports Medicine   New onset headache     Referral to neuro at your request given your history. In the meantime, keep a headache diary - what you are doing when one starts, what you've had to eat/drink prior, how long it lasts, other associated symptoms, etc.    Patient aware of signs/symptoms requiring further/urgent evaluation.       Relevant Orders   Ambulatory referral to Neurology       No orders of the defined types were placed in this encounter.   Return if symptoms worsen or fail to improve.  Terrilyn Saver, NP

## 2022-06-02 NOTE — Telephone Encounter (Signed)
Appt today

## 2022-06-02 NOTE — Patient Instructions (Signed)
Knee pain: Wear the brace with activity Rest, ice, heat, elevation, home stretches Continue PT  Referral to sports medicine to further evaluate   New headache type - referral to neuro. In the meantime, keep a headache diary - what you are doing when one starts, what you've had to eat/drink prior, how long it lasts, other associated symptoms, etc.

## 2022-06-02 NOTE — Telephone Encounter (Signed)
Pt seen at ED 

## 2022-06-03 ENCOUNTER — Ambulatory Visit (HOSPITAL_BASED_OUTPATIENT_CLINIC_OR_DEPARTMENT_OTHER)
Admission: RE | Admit: 2022-06-03 | Discharge: 2022-06-03 | Disposition: A | Discharge status: Home / Self Care | Payer: BC Managed Care – PPO | Source: Ambulatory Visit | Attending: Family Medicine | Admitting: Family Medicine

## 2022-06-03 ENCOUNTER — Encounter: Payer: Self-pay | Admitting: Family Medicine

## 2022-06-03 ENCOUNTER — Encounter: Payer: Self-pay | Admitting: Physical Therapy

## 2022-06-03 ENCOUNTER — Ambulatory Visit (INDEPENDENT_AMBULATORY_CARE_PROVIDER_SITE_OTHER): Payer: BC Managed Care – PPO | Admitting: Family Medicine

## 2022-06-03 ENCOUNTER — Ambulatory Visit: Payer: BC Managed Care – PPO | Admitting: Physical Therapy

## 2022-06-03 ENCOUNTER — Telehealth: Payer: Self-pay | Admitting: Family Medicine

## 2022-06-03 ENCOUNTER — Ambulatory Visit: Payer: Self-pay

## 2022-06-03 VITALS — BP 102/60 | Ht 61.0 in | Wt 110.0 lb

## 2022-06-03 DIAGNOSIS — R262 Difficulty in walking, not elsewhere classified: Secondary | ICD-10-CM

## 2022-06-03 DIAGNOSIS — G800 Spastic quadriplegic cerebral palsy: Secondary | ICD-10-CM | POA: Diagnosis not present

## 2022-06-03 DIAGNOSIS — M222X2 Patellofemoral disorders, left knee: Secondary | ICD-10-CM | POA: Insufficient documentation

## 2022-06-03 DIAGNOSIS — R2681 Unsteadiness on feet: Secondary | ICD-10-CM

## 2022-06-03 DIAGNOSIS — M25562 Pain in left knee: Secondary | ICD-10-CM

## 2022-06-03 DIAGNOSIS — R531 Weakness: Secondary | ICD-10-CM

## 2022-06-03 MED ORDER — ONDANSETRON HCL 4 MG PO TABS: 4.0000 mg | ORAL_TABLET | Freq: Three times a day (TID) | ORAL | 0 refills | Status: DC | PRN

## 2022-06-03 NOTE — Patient Instructions (Signed)
Nice to meet you Please try the exercises Please try the brace  You could consider trying blood flow restriction cuffs.  We'll call with the xray results.    Please send me a message in MyChart with any questions or updates.  Please see me back in 4 weeks.   --Dr. Raeford Razor

## 2022-06-03 NOTE — Telephone Encounter (Signed)
Left VM for patient. If she calls back please have her speak with a nurse/CMA and inform that her hip joint is shallow. The best thing to do is continue to build strength in the area. We could consider a compression sleeve for her hip to help in the meantime as well. .   If any questions then please take the best time and phone number to call and I will try to call her back.   Rosemarie Ax, MD [facility] 06/03/2022, 3:57 PM

## 2022-06-03 NOTE — Progress Notes (Signed)
  Kerri Robertson - 22 y.o. female MRN [id number]  Date of birth: [birth year]  SUBJECTIVE:  Including CC & ROS.  No chief complaint on file.   Kerri Robertson is a 22 y.o. female that is presenting with left knee pain.  The pain is severe in nature at times.  It is intermittent and seems to be exacerbated with walking.  It is anterior in nature.  She has a significant history of different surgeries throughout the hip, leg, and Achilles that was performed when she was 40 or 22 years old.  She is also had surgery on her feet.   Independent review of the left knee x-ray from 1/27 shows no acute changes.  Review of Systems See HPI   HISTORY: Past Medical, Surgical, Social, and Family History Reviewed & Updated per EMR.   Pertinent Historical Findings include:  Past Medical History:  Diagnosis Date   Allergy    Cerebral palsy ([place])    Stroke [facility])     Past Surgical History:  Procedure Laterality Date   ARM SURGERY     2013   FOOT SURGERY     2010   HAMSTRING LENGTHENING     TIBIA / FIBIA LENGTHENING       PHYSICAL EXAM:  VS: BP 102/60   Ht 5\' 1"  (1.549 m)   Wt 110 lb (49.9 kg)   LMP 05/21/2022 (Approximate)   BMI 20.78 kg/m  Physical Exam Gen: NAD, alert, cooperative with exam, well-appearing MSK:  Neurovascularly intact    Limited ultrasound: Left knee pain:  No effusion the suprapatellar pouch. Normal-appearing quadricep and patellar tendon. Normal-appearing medial and lateral meniscus. No changes of the medial or lateral retinaculum.  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  The left leg is internally rotated with weakness at the hip. -Counseled on home exercise therapy and supportive care. -Counseled on blood flow restriction because. -Could consider a C brace to help with  ambulation and avoid any lower extremity pain.  Patellofemoral pain syndrome of left knee Acutely occurring.  Symptoms most consistent with a patellofemoral problem as structurally everything is reassuring. -Counseled on home exercise therapy and supportive care. -X-ray of the hip. -Can pursue custom orthotics. -Provided spiderweb brace today. -Could consider further imaging.

## 2022-06-03 NOTE — Assessment & Plan Note (Signed)
Her gait is likely contributing to her left knee pain.  The left leg is internally rotated with weakness at the hip. -Counseled on home exercise therapy and supportive care. -Counseled on blood flow restriction because. -Could consider a C brace to help with ambulation and avoid any lower extremity pain.

## 2022-06-03 NOTE — Assessment & Plan Note (Signed)
Acutely occurring.  Symptoms most consistent with a patellofemoral problem as structurally everything is reassuring. -Counseled on home exercise therapy and supportive care. -X-ray of the hip. -Can pursue custom orthotics. -Provided spiderweb brace today. -Could consider further imaging.

## 2022-06-03 NOTE — Addendum Note (Signed)
Addended by: Caleen Jobs B on: 06/03/2022 10:36 AM   Modules accepted: Orders

## 2022-06-03 NOTE — Therapy (Signed)
OUTPATIENT PHYSICAL THERAPY TREATMENT     Patient Name: Kerri Robertson MRN: [id number] DOB:2000/11/07, 22 y.o., female Today's Date: 04/10/2022   END OF SESSION:  PT End of Session - 06/03/22 1738     Visit Number 9    Date for PT Re-Evaluation 06/19/22    PT Start Time 6659    PT Stop Time [id number]    PT Time Calculation (min) 48 min    Activity Tolerance Patient tolerated treatment well;No increased pain    Behavior During Therapy WFL for tasks assessed/performed                    Past Medical History:  Diagnosis Date   Allergy     Cerebral palsy ([place])     Stroke [facility])           Past Surgical History:  Procedure Laterality Date   ARM SURGERY        2013   FOOT SURGERY        2010   HAMSTRING LENGTHENING       TIBIA / FIBIA LENGTHENING            Patient Active Problem List    Diagnosis Date Noted   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   Balance problem 05/06/2018   Syncope 05/06/2018   History of cerebrovascular accident 11/18/2017   CP (cerebral palsy), spastic, 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[place]) 06/25/2017   Anxiety state 06/25/2017   Spasticity 06/25/2017   Allergic rhinitis 10/21/2016   Mild intermittent asthma 10/21/2016      PCP: Kerri Saver, NP   REFERRING PROVIDER: Terrilyn Saver, NP   REFERRING DIAG: G80.0 (ICD-10-CM) - CP (cerebral palsy), spastic, 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[place])    THERAPY DIAG:  Spastic quadriplegic cerebral palsy ([place])   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 Rechecking CBC, iron, and vitamin D today. She has been on supplementation (refilling iron for her). Will make additional changes based on labs if indicated. PT referral placed to [facility] at her request. No acute concerns today.      PAIN:  Are you having pain? No   PRECAUTIONS: None   WEIGHT BEARING RESTRICTIONS: No   FALLS:  Has patient fallen in last 6 months? Yes. 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 Hobbies- Strength training, archery, baseball, tennis, dance    PLOF: Needs assistance with ADLs   PATIENT GOALS: Patient would like to improve her flexibility and balance.   NEXT MD VISIT:    OBJECTIVE:    DIAGNOSTIC FINDINGS:  Radiology consult confirms lumbar scoliosis with apex at L2-3, but since patient is asymptomatic, recommend monitoring it and continue to strengthen.           COGNITION: Overall cognitive status: Within functional limits for tasks assessed                         SENSATION: Not tested     MUSCLE LENGTH: Hamstrings: Right 40 deg; Left 30 deg     POSTURE: left pelvic obliquity, weight shift right, and hips rotated to L with L LE held in IR, increased R WB     LOWER EXTREMITY ROM: Trunk lateraly flexed due to scoliosis, L hip unable to  achieve neutral ER, abd limited, B hamstrings tight, L ankle does not achieve neutral DF or inversion. LUE with limited ROM at all joints, in all planes. she wears a splint on her wrist at night.      LOWER EXTREMITY MMT: RLE 4/5, LLE with fluctuating tone throughout, HS very weak, hips also weak-MMT accuracy questionable due to her tone.   FUNCTIONAL TESTS:  5 times sit to stand: 14.77 Timed up and go (TUG): 13.75 Berg Balance Scale: 38   GAIT: Distance walked: clinic distances Assistive device utilized: None Level of assistance: Modified independence Comments: Step-to pattern;Decreased step length - left;Decreased stance time - left;Decreased weight shift to left;Shuffle;Ataxic;Lateral hip instability;Trunk rotated posteriorly on left      TODAY'S TREATMENT:                                                                                                                              DATE:  06/03/22 Nustep level 5 x 6 mintues Volleyball on and off airex Discussion of POC and possible causes of the knee and HA pains as well as what we can do to help with patient and her mom, reviewed use of brace with caregiver and patient STM to the upper traps, rhomboid and neck mms  05/28/22 Standing hip abduction yellow TB around knees 2x10 Standing Rt step up/hold with forward lunge to encouraged Lt hip extension x15 Supine Lt quad set with hip alignment correction 10x3 sec hold  Manual: passive stretch Lt hamstring with contract/relax, Lt quadriceps in prone, Lt butterfly and Lt adductor stretch, Lt patellar mobs all directions x2 bouts  Self-care: brace wear, any limitations with return to volunteering   05/26/22 THEREX Nustep L5 x 5 min Supine butterfly 2x30 sec Supine Knee extended hip ER x20 Supine bridge with clamshell red TB 2x10 S/L clamshell AAROM x30 S/L Hip ext AAROM x30 Prone hamstring curl x30 Prone hip ER knee bent iso hold 10x3 sec SELF  CARE Discussed appropriate knee brace  for improved patellar alignment with gait. Discussed stretching while in the bathroom to keep back of heel from cramping and to decrease any N/T into L foot.    05/07/22 NuStep L5 x 6 minutes Sit to stand on Air ex pad with red tband resistance for hip abd, 10 reps Resisted gait with 10#, x 4 in each direction. Standing reach/pull with RUE into horizontal abd, then horiz add, 10 reps each, including rotation and weight shift onto LLE.  Standing with back to wall, red physioball positioned in lower back, squat slightly and maintain while rotating trunk R/L 2 x 10  04/24/22 NuStep L5 x 6 minutes Supine legs over 65 cm physioball- rolled DKTC, B bridge, U bridge with R, then L leg, LTR Supine with LLE pushed against red physioball, min A for positioning, push and hold x 10 sec, 10 reps. Sit to stand from elevated mat, LLE slightly behind R and therapist facilitating weight shift onto LLE. Standing alternate taps on 4" step, min A for balance and facilitation of LLE in stance. Place LLE on 4" step, rotate to R while maintaining LLE in position to stretch medial L hip.   04/21/22 Nustep Level 4 x 6 minutes Leg press 20# 2x10, left only 4x5 Bridges ball b/n knees 2x10 HS, piriformis and adductor stretch on the left Crunches 3x6 Lower trunk rotations Feet on ball trunk rotations Feet on ball bridges 4" toe touches holding rail and then without Side stepping over objects Side step on and off airex holding and without On airex volleyball,    04/18/22 Bike L3 x 6 minutes Leg press 10 reps with BLE, 10 with LLE, 20#, emphasis on neutral hip rotation on L, mod A for the positioning. Seated hip abd against yellow Tband, able to hold LLE, but no true abd. 20 reps U paloff with RUE, yellow Tband, 2 x 10 reps, min TC to wt shift onto LLE and stabilize on L B side stepping over lines on the floor, x8 in each direction-Initially had much difficulty with balance, improved to speed up with less  assist. Sit <> stand x 10, therapist facilitated wt shift onto LLE.   04/16/22 NuStep L4 x 6 minutes. Supine clamshells against yellow Tband Supine bridge with yellow Tband resistance Standing/step with RLE forward and back, emphasize LLE position in stance, tends to hold leg in IR. Standing step back and out with RLE to improve L hip ER. Ambulated 1 x 80' emphasizing tall posture over LLE and neutral rotation at L hip in WB. Sit <> stand from elevated mat with mod facilitation to maintain WB through LLE. 10 reps  04/10/22   education   PATIENT EDUCATION:  Education details: impact spasticity and inflexibility has on gait and patella tracking during ambulation; encouraged increase in stretch; consider botox or other appropriate intervention with MD regarding spasticity (if needed); updates to HEP Person educated: Patient and Caregiver   Education method: Explanation Education comprehension: verbalized understanding   HOME EXERCISE PROGRAM: [id number]   ASSESSMENT:   CLINICAL IMPRESSION: Pt , caregiver and her mom report 2 falls since we last saw [facility] and an ED visit, the falls are from slipping and or the left knee giving out.  She went to the ED due to a severe HA that was causing nausea and vomiting.  CT scan of the head was negative and x-ray of the knee was negative.  I did some palpation of the upper trap, rhomboid and  neck and found some significant knots and tightness.  I mentioned this to her mom and we hypothesized could the pain and HA be from stress due to her feeling likethe knee is giving out and she is worried about her mobility and independence.  I could not replicate the HA type symptoms with pushing on the knots so I am not sure these are trigger points.  We discussed possibly doing more STM to the neck and upper traps to see if this is stress related, we also discussed the BFR use as prescribed by Dr. Jordan Likes.  OBJECTIVE IMPAIRMENTS: Abnormal gait, decreased balance,  decreased coordination, decreased endurance, decreased mobility, difficulty walking, decreased ROM, decreased strength, increased fascial restrictions, increased muscle spasms, impaired flexibility, impaired tone, improper body mechanics, and postural dysfunction.    ACTIVITY LIMITATIONS: standing, squatting, stairs, transfers, and locomotion level   PARTICIPATION LIMITATIONS: shopping, community activity, and occupation   PERSONAL FACTORS: 1 comorbidity: CP  are also affecting patient's functional outcome.    REHAB POTENTIAL: Good   CLINICAL DECISION MAKING: Stable/uncomplicated   EVALUATION COMPLEXITY: Moderate     GOALS: Goals reviewed with patient? Yes   SHORT TERM GOALS: Target date: 05/01/22 I with initial HEP Baseline: Goal status: ongoing   LONG TERM GOALS: Target date: 06/19/22   I with final HEP Baseline:  Goal status: INITIAL   2.  Improve TUG time to < 12 sec Baseline:  Goal status: ongoing  3.  Improve 5 x STS to <12 Baseline:  Goal status:ongoing   4.  Increase BERG score to at least 44 Baseline:  Goal status: INITIAL   5.  Patient will ambulate with normalized gait pattern, minimizing abnormal weight shifts and increasing LLE position to more neutral hip rotation. Baseline:  Goal status: ongoing     PLAN:   PT FREQUENCY: 1-2x/week   PT DURATION: 10 weeks   PLANNED INTERVENTIONS: Therapeutic exercises, Therapeutic activity, Neuromuscular re-education, Balance training, Gait training, Patient/Family education, Self Care, Joint mobilization, Dry Needling, Cryotherapy, Moist heat, Ionotophoresis 4mg /ml Dexamethasone, and Manual therapy   PLAN FOR NEXT SESSION: see if the STM helped, I did send a note to Dr. and to Dr. Jordan Likes   5:38 PM,06/03/22 06/05/22, PT

## 2022-06-04 ENCOUNTER — Encounter: Payer: Self-pay | Admitting: Neurology

## 2022-06-04 ENCOUNTER — Encounter: Payer: Self-pay | Admitting: Family Medicine

## 2022-06-04 ENCOUNTER — Telehealth: Payer: Self-pay | Admitting: Neurology

## 2022-06-04 ENCOUNTER — Ambulatory Visit (INDEPENDENT_AMBULATORY_CARE_PROVIDER_SITE_OTHER): Payer: BC Managed Care – PPO | Admitting: Neurology

## 2022-06-04 ENCOUNTER — Ambulatory Visit (INDEPENDENT_AMBULATORY_CARE_PROVIDER_SITE_OTHER): Payer: BC Managed Care – PPO | Admitting: Family Medicine

## 2022-06-04 ENCOUNTER — Encounter: Payer: Self-pay | Admitting: Physical Therapy

## 2022-06-04 VITALS — Ht 61.0 in | Wt 113.0 lb

## 2022-06-04 VITALS — BP 104/67 | HR 92 | Ht 61.0 in | Wt 113.0 lb

## 2022-06-04 DIAGNOSIS — G4453 Primary thunderclap headache: Secondary | ICD-10-CM

## 2022-06-04 DIAGNOSIS — R519 Headache, unspecified: Secondary | ICD-10-CM

## 2022-06-04 DIAGNOSIS — R51 Headache with orthostatic component, not elsewhere classified: Secondary | ICD-10-CM

## 2022-06-04 DIAGNOSIS — H539 Unspecified visual disturbance: Secondary | ICD-10-CM

## 2022-06-04 DIAGNOSIS — M222X2 Patellofemoral disorders, left knee: Secondary | ICD-10-CM | POA: Diagnosis not present

## 2022-06-04 DIAGNOSIS — G809 Cerebral palsy, unspecified: Secondary | ICD-10-CM | POA: Insufficient documentation

## 2022-06-04 DIAGNOSIS — S161XXA Strain of muscle, fascia and tendon at neck level, initial encounter: Secondary | ICD-10-CM

## 2022-06-04 DIAGNOSIS — G43009 Migraine without aura, not intractable, without status migrainosus: Secondary | ICD-10-CM

## 2022-06-04 MED ORDER — RIZATRIPTAN BENZOATE 10 MG PO TBDP: 10.0000 mg | ORAL_TABLET | ORAL | 11 refills | Status: DC | PRN

## 2022-06-04 MED ORDER — NAYZILAM 5 MG/0.1ML NA SOLN: NASAL | 3 refills | Status: AC

## 2022-06-04 MED ORDER — PROMETHAZINE HCL 25 MG RE SUPP: 25.0000 mg | Freq: Four times a day (QID) | RECTAL | 11 refills | Status: DC | PRN

## 2022-06-04 NOTE — Telephone Encounter (Signed)
Pt's mother called stating that the pharmacy is refusing to give her daughter the medications that were sent in for her. They will not give her the Rizatriptan due to a prior stroke and they will not give her the Nayzilam due to it having Benzo. Please advise.

## 2022-06-04 NOTE — Patient Instructions (Addendum)
- MRI brain w/wo contrast: MRI brain due to concerning symptoms of morning headaches, positional and exertional headaches,vision changes, worsening headaches  to look for space occupying mass, chiari or intracranial hypertension (pseudotumor), strokes, malignancies, vasculidities, demyelination(multiple sclerosis) or other  - Suppository phenergan nausea and migraine if vomiting and can't anything by mouth - Emergency: Rizatriptan: Please take one tablet RIGHT at the onset of your headache. If it does not improve the symptoms please take one additional tablet. Do not take more then 2 tablets in 24hrs. Do not take use more then 2 to 3 times in a week. Can take with zofran or phenergan or excedrin.  - Physical Therapy: Cervical myofascial pain, forward posture contributing to migraines and cervicalgia. Please evaluate and treat including dry needling, stretching, strengthening, manual therapy/massage, heating, TENS unit, exercising for scapular stabilization, pectoral stretching and rhomboid strengthening as clinically warranted as well as any other modality as recommended by evaluation. - Heat, cold, Topical creams (voltaren), massage, PT can also help for the neck  Meds ordered this encounter  Medications   promethazine (PHENERGAN) 25 MG suppository    Sig: Place 1 suppository (25 mg total) rectally every 6 (six) hours as needed for nausea or vomiting. Take if vomiting and cannot take oral zofran or zofran doesn't work    Dispense:  12 each    Refill:  11   rizatriptan (MAXALT-MLT) 10 MG disintegrating tablet    Sig: Take 1 tablet (10 mg total) by mouth as needed for migraine. May repeat in 2 hours if needed    Dispense:  9 tablet    Refill:  11   Midazolam (NAYZILAM) 5 MG/0.1ML SOLN    Sig: Use 1 spray in 1 nostril as needed for seizure lasting longer than 2 minutes or for seizure cluster.  May repeat in 5 minutes if needed.    Dispense:  2 each    Refill:  3    Migraine Headache A migraine  headache is an intense, throbbing pain on one side or both sides of the head. Migraine headaches may also cause other symptoms, such as nausea, vomiting, and sensitivity to light and noise. A migraine headache can last from 4 hours to 3 days. Talk with your doctor about what things may bring on (trigger) your migraine headaches. What are the causes? The exact cause of this condition is not known. However, a migraine may be caused when nerves in the brain become irritated and release chemicals that cause inflammation of blood vessels. This inflammation causes pain. This condition may be triggered or caused by: Drinking alcohol. Smoking. Taking medicines, such as: Medicine used to treat chest pain (nitroglycerin). Birth control pills. Estrogen. Certain blood pressure medicines. Eating or drinking products that contain nitrates, glutamate, aspartame, or tyramine. Aged cheeses, chocolate, or caffeine may also be triggers. Doing physical activity. Other things that may trigger a migraine headache include: Menstruation. Pregnancy. Hunger. Stress. Lack of sleep or too much sleep. Weather changes. Fatigue. What increases the risk? The following factors may make you more likely to experience migraine headaches: Being a certain age. This condition is more common in people who are 35-55 years old. Being female. Having a family history of migraine headaches. Being Caucasian. Having a mental health condition, such as depression or anxiety. Being obese. What are the signs or symptoms? The main symptom of this condition is pulsating or throbbing pain. This pain may: Happen in any area of the head, such as on one side or both sides. Interfere  with daily activities. Get worse with physical activity. Get worse with exposure to bright lights or loud noises. Other symptoms may include: Nausea. Vomiting. Dizziness. General sensitivity to bright lights, loud noises, or smells. Before you get a  migraine headache, you may get warning signs (an aura). An aura may include: Seeing flashing lights or having blind spots. Seeing bright spots, halos, or zigzag lines. Having tunnel vision or blurred vision. Having numbness or a tingling feeling. Having trouble talking. Having muscle weakness. Some people have symptoms after a migraine headache (postdromal phase), such as: Feeling tired. Difficulty concentrating. How is this diagnosed? A migraine headache can be diagnosed based on: Your symptoms. A physical exam. Tests, such as: CT scan or an MRI of the head. These imaging tests can help rule out other causes of headaches. Taking fluid from the spine (lumbar puncture) and analyzing it (cerebrospinal fluid analysis, or CSF analysis). How is this treated? This condition may be treated with medicines that: Relieve pain. Relieve nausea. Prevent migraine headaches. Treatment for this condition may also include: Acupuncture. Lifestyle changes like avoiding foods that trigger migraine headaches. Biofeedback. Cognitive behavioral therapy. Follow these instructions at home: Medicines Take over-the-counter and prescription medicines only as told by your health care provider. Ask your health care provider if the medicine prescribed to you: Requires you to avoid driving or using heavy machinery. Can cause constipation. You may need to take these actions to prevent or treat constipation: Drink enough fluid to keep your urine pale yellow. Take over-the-counter or prescription medicines. Eat foods that are high in fiber, such as beans, whole grains, and fresh fruits and vegetables. Limit foods that are high in fat and processed sugars, such as fried or sweet foods. Lifestyle Do not drink alcohol. Do not use any products that contain nicotine or tobacco, such as cigarettes, e-cigarettes, and chewing tobacco. If you need help quitting, ask your health care provider. Get at least 8 hours of  sleep every night. Find ways to manage stress, such as meditation, deep breathing, or yoga. General instructions Keep a journal to find out what may trigger your migraine headaches. For example, write down: What you eat and drink. How much sleep you get. Any change to your diet or medicines. If you have a migraine headache: Avoid things that make your symptoms worse, such as bright lights. It may help to lie down in a dark, quiet room. Do not drive or use heavy machinery. Ask your health care provider what activities are safe for you while you are experiencing symptoms. Keep all follow-up visits as told by your health care provider. This is important. Contact a health care provider if: You develop symptoms that are different or more severe than your usual migraine headache symptoms. You have more than 15 headache days in one month. Get help right away if: Your migraine headache becomes severe. Your migraine headache lasts longer than 72 hours. You have a fever. You have a stiff neck. You have vision loss. Your muscles feel weak or like you cannot control them. You start to lose your balance often. You have trouble walking. You faint. You have a seizure. Summary A migraine headache is an intense, throbbing pain on one side or both sides of the head. Migraines may also cause other symptoms, such as nausea, vomiting, and sensitivity to light and noise. This condition may be treated with medicines and lifestyle changes. You may also need to avoid certain things that trigger a migraine headache. Keep a journal to  find out what may trigger your migraine headaches. Contact your health care provider if you have more than 15 headache days in a month or you develop symptoms that are different or more severe than your usual migraine headache symptoms. This information is not intended to replace advice given to you by your health care provider. Make sure you discuss any questions you have with  your health care provider. Document Revised: 10/03/2021 Document Reviewed: 06/03/2018 Elsevier Patient Education  [place].  Promethazine Suppositories What is this medication? PROMETHAZINE (proe METH a zeen) prevents and treats the symptoms of an allergic reaction. It works by blocking histamine, a substance released by the body during an allergic reaction. It may also help you relax, go to sleep, and relieve nausea, vomiting, or pain before or after procedures. It can also prevent and treat motion sickness. It works by helping your nervous system calm down by blocking substances in the body that may cause nausea and vomiting. It belongs to a group of medications called antihistamines. This medicine may be used for other purposes; ask your health care provider or pharmacist if you have questions. COMMON BRAND NAME(S): Phenadoz, Phenergan, Promethegan What should I tell my care team before I take this medication? They need to know if you have any of these conditions: Blockage in your bowels Diabetes Glaucoma Have trouble controlling your muscles Heart disease Liver disease Low blood cell levels (white cells, red cells, and platelets) Lung or breathing disease, such as asthma Parkinson disease Prostate disease Seizures Stomach or intestine problems Trouble passing urine An unusual or allergic reaction to promethazine, sulfites, other medications, foods, dyes, or preservatives Pregnant or trying to get pregnant Breastfeeding How should I use this medication? This medication is for rectal use only. Do not take by mouth. Wash your hands before and after use. Take off the foil wrapping. Wet the tip of the suppository with cold tap water to make it easier to use. Lie on your side with your lower leg straightened out and your upper leg bent forward toward your stomach. Lift upper buttock to expose the rectal area. Apply gentle pressure to insert the suppository completely into the  rectum, pointed end first. Hold buttocks together for a few seconds. Remain lying down for about 15 minutes to avoid having the suppository come out. Do not use more often than directed. Talk to your care team about the use of this medication in children. Special care may be needed. This medication should not be given to infants and children younger than 68 years old. Overdosage: If you think you have taken too much of this medicine contact a poison control center or emergency room at once. NOTE: This medicine is only for you. Do not share this medicine with others. What if I miss a dose? If you miss a dose, use it as soon as you can. If it is almost time for your next dose, use only that dose. Do not use double doses. What may interact with this medication? Alcohol Antihistamines for allergy, cough, and cold Atropine Certain medications for anxiety or sleep Certain medications for bladder problems, such as oxybutynin or tolterodine Certain medications for depression, such as amitriptyline, fluoxetine, sertraline Certain medications for Parkinson disease, such as benztropine or trihexyphenidyl Certain medications for seizures, such as phenobarbital, primidone, phenytoin Certain medications for stomach problems, such as dicyclomine, hyoscyamine Certain medications for travel sickness, such as scopolamine Epinephrine General anesthetics, such as halothane, isoflurane, methoxyflurane, propofol Ipratropium MAOIs, such as Marplan, Nardil,  and Parnate Medications for blood pressure Medications that relax muscles for surgery Metoclopramide Opioids This list may not describe all possible interactions. Give your health care provider a list of all the medicines, herbs, non-prescription drugs, or dietary supplements you use. Also tell them if you smoke, drink alcohol, or use illegal drugs. Some items may interact with your medicine. What should I watch for while using this medication? Visit your care  team for regular checks on your progress. Tell your care team if your symptoms do not start to get better or if they get worse. This medication may affect your coordination, reaction time, or judgment. Do not drive or operate machinery until you know how this medication affects you. Sit up or stand slowly to reduce the risk of dizzy or fainting spells. Drinking alcohol with this medication can increase the risk of these side effects. Your mouth may get dry. Chewing sugarless gum or sucking hard candy and drinking plenty of water may help. Contact your care team if the problem does not go away or is severe. This medication may cause dry eyes and blurred vision. If you wear contact lenses, you may feel some discomfort. Lubricating eye drops may help. See your care team if the problem does not go away or is severe. This medication can make you more sensitive to the sun. Keep out of the sun. If you cannot avoid being in the sun, wear protective clothing and sunscreen. Do not use sun lamps, tanning beds, or tanning booths. This medication may increase blood sugar. The risk may be higher in patients who already have diabetes. Ask your care team what you can do to lower your risk of diabetes while taking this medication. What side effects may I notice from receiving this medication? Side effects that you should report to your care team as soon as possible: Allergic reactions--skin rash, itching, hives, swelling of the face, lips, tongue, or throat CNS depression--slow or shallow breathing, shortness of breath, feeling faint, dizziness, confusion, trouble staying awake High fever stiff muscles, increased sweating, fast or irregular heartbeat, and confusion, which may be signs of neuroleptic malignant syndrome Infection--fever, chills, cough, or sore throat Liver injury--right upper belly pain, loss of appetite, nausea, light-colored stool, dark yellow or brown urine, yellowing skin or eyes, unusual weakness or  fatigue Seizures Sudden eye pain or change in vision such as blurry vision, seeing halos around lights, vision loss Trouble passing urine Uncontrolled and repetitive body movements, muscle stiffness or spasms, tremors or shaking, loss of balance or coordination, restlessness, shuffling walk, which may be signs of extrapyramidal symptoms (EPS) Side effects that usually do not require medical attention (report to your care team if they continue or are bothersome): Confusion Constipation Dizziness Drowsiness Dry mouth Sensitivity to light Vivid dreams or nightmares This list may not describe all possible side effects. Call your doctor for medical advice about side effects. You may report side effects to FDA at 1-800-FDA-1088. Where should I keep my medication? Keep out of the reach of children. Store in a refrigerator between 2 and 8 degrees C (36 and 46 degrees F). Throw away any unused medication after the expiration date. NOTE: This sheet is a summary. It may not cover all possible information. If you have questions about this medicine, talk to your doctor, pharmacist, or health care provider.  2023 Elsevier/Gold Standard (2021-11-02 00:00:00) Rizatriptan Disintegrating Tablets What is this medication? RIZATRIPTAN (rye za TRIP tan) treats migraines. It works by blocking pain signals and narrowing blood  vessels in the brain. It belongs to a group of medications called triptans. It is not used to prevent migraines. This medicine may be used for other purposes; ask your health care provider or pharmacist if you have questions. COMMON BRAND NAME(S): Maxalt-MLT What should I tell my care team before I take this medication? They need to know if you have any of these conditions: Circulation problems in fingers and toes Diabetes Heart disease High blood pressure High cholesterol History of irregular heartbeat History of stroke Stomach or intestine problems Tobacco use An unusual or  allergic reaction to rizatriptan, other medications, foods, dyes, or preservatives Pregnant or trying to get pregnant Breast-feeding How should I use this medication? Take this medication by mouth. Take it as directed on the prescription label. You do not need water to take this medication. Leave the tablet in the sealed pack until you are ready to take it. With dry hands, open the pack and gently remove the tablet. If the tablet breaks or crumbles, throw it away. Use a new tablet. Place the tablet on the tongue and allow it to dissolve. Then, swallow it. Do not cut, crush, or chew this medication. Do not use it more often than directed. Talk to your care team about the use of this medication in children. While it may be prescribed for children as young as 6 years for selected conditions, precautions do apply. Overdosage: If you think you have taken too much of this medicine contact a poison control center or emergency room at once. NOTE: This medicine is only for you. Do not share this medicine with others. What if I miss a dose? This does not apply. This medication is not for regular use. What may interact with this medication? Do not take this medication with any of the following: Ergot alkaloids, such as dihydroergotamine, ergotamine MAOIs, such as Marplan, Nardil, Parnate Other medications for migraine headache, such as almotriptan, eletriptan, frovatriptan, naratriptan, sumatriptan, zolmitriptan This medication may also interact with the following: Certain medications for depression, anxiety, or other mental health conditions Propranolol This list may not describe all possible interactions. Give your health care provider a list of all the medicines, herbs, non-prescription drugs, or dietary supplements you use. Also tell them if you smoke, drink alcohol, or use illegal drugs. Some items may interact with your medicine. What should I watch for while using this medication? Visit your care  team for regular checks on your progress. Tell your care team if your symptoms do not start to get better or if they get worse. This medication may affect your coordination, reaction time, or judgment. Do not drive or operate machinery until you know how this medication affects you. Sit up or stand slowly to reduce the risk of dizzy or fainting spells. If you take migraine medications for 10 or more days a month, your migraines may get worse. Keep a diary of headache days and medication use. Contact your care team if your migraine attacks occur more frequently. What side effects may I notice from receiving this medication? Side effects that you should report to your care team as soon as possible: Allergic reactions--skin rash, itching, hives, swelling of the face, lips, tongue, or throat Burning, pain, tingling, or color changes in the hands, arms, legs, or feet Heart attack--pain or tightness in the chest, shoulders, arms, or jaw, nausea, shortness of breath, cold or clammy skin, feeling faint or lightheaded Heart rhythm changes--fast or irregular heartbeat, dizziness, feeling faint or lightheaded, chest pain, trouble  breathing Increase in blood pressure Irritability, confusion, fast or irregular heartbeat, muscle stiffness, twitching muscles, sweating, high fever, seizure, chills, vomiting, diarrhea, which may be signs of serotonin syndrome Raynaud syndrome--cool, numb, or painful fingers or toes that may change color from pale, to blue, to red Seizures Stroke--sudden numbness or weakness of the face, arm, or leg, trouble speaking, confusion, trouble walking, loss of balance or coordination, dizziness, severe headache, change in vision Sudden or severe stomach pain, bloody diarrhea, fever, nausea, vomiting Vision loss Side effects that usually do not require medical attention (report to your care team if they continue or are bothersome): Dizziness Unusual weakness or fatigue This list may not  describe all possible side effects. Call your doctor for medical advice about side effects. You may report side effects to FDA at 1-800-FDA-1088. Where should I keep my medication? Keep out of the reach of children and pets. Store at room temperature between 15 and 30 degrees C (59 and 86 degrees F). Protect from light and moisture. Get rid of any unused medication after the expiration date. To get rid of medications that are no longer needed or have expired: Take the medication to a medication take-back program. Check with your pharmacy or law enforcement to find a location. If you cannot return the medication, check the label or package insert to see if the medication should be thrown out in the garbage or flushed down the toilet. If you are not sure, ask your care team. If it is safe to put it in the trash, empty the medication out of the container. Mix the medication with cat litter, dirt, coffee grounds, or other unwanted substance. Seal the mixture in a bag or container. Put it in the trash. NOTE: This sheet is a summary. It may not cover all possible information. If you have questions about this medicine, talk to your doctor, pharmacist, or health care provider.  2023 Elsevier/Gold Standard (2021-08-22 00:00:00)

## 2022-06-04 NOTE — Progress Notes (Signed)
  Kerri Robertson - 22 y.o. female MRN [id number]  Date of birth: [birth year]  SUBJECTIVE:  Including CC & ROS.  No chief complaint on file.   Kerri Robertson is a 22 y.o. female that is  here for orthotics.    Review of Systems See HPI   HISTORY: Past Medical, Surgical, Social, and Family History Reviewed & Updated per EMR.   Pertinent Historical Findings include:  Past Medical History:  Diagnosis Date   Allergy    Cerebral palsy ([place])    CP (cerebral palsy) ([place])    Stroke ([place])     Past Surgical History:  Procedure Laterality Date   ARM SURGERY     2013   FOOT SURGERY     2010   HAMSTRING LENGTHENING     TIBIA / FIBIA LENGTHENING       PHYSICAL EXAM:  VS: Ht 5\' 1"  (1.549 m)   Wt 113 lb (51.3 kg)   LMP 05/23/2022 (Exact Date)   BMI 21.35 kg/m  Physical Exam Gen: NAD, alert, cooperative with exam, well-appearing MSK:  Neurovascularly intact    Patient was fitted for a standard, cushioned, semi-rigid orthotic. The orthotic was heated and afterward the patient stood on the orthotic blank positioned on the orthotic stand. The patient was positioned in subtalar neutral position and 10 degrees of ankle dorsiflexion in a weight bearing stance. After completion of molding, a stable base was applied to the orthotic blank. The blank was ground to a stable position for weight bearing. Size: 7 Pairs: 2 Base: Blue EVA Additional Posting and Padding: None The patient ambulated these, and they were very comfortable.    ASSESSMENT & PLAN:   Patellofemoral pain syndrome of left knee Completed orthotics today. Her shoes were a larger size so may need to make a smaller pair.

## 2022-06-04 NOTE — Progress Notes (Signed)
[facility]    Provider:  Dr Jaynee Eagles Requesting Provider: Terrilyn Saver, NP Primary Care Provider:  Terrilyn Saver, NP  CC:  Headaches/migraines  HPI:  Kerri Robertson is a 22 y.o. female here as requested by Terrilyn Saver, NP for headache. PMHx CP, scoliosis, syncope, CVA, anxiety, spasticity.  I reviewed emergency room notes from January 27 of this year, patient presented with headache, improved with migraine cocktail, and imaging was unremarkable.   There was a lot of stress. Mother had migraines and cousins since a child. Never really had headaches before. It was a Friday night and she was knee pain, that morning she woke with neck pain and thought she just slept on it wrong (mother provides much information as well). Head started hurting Jan 26th, at 4pm she said she had a headache but went away, on the right side behind the eye and in the temple, constant pain and in the right side of the neck. At 730pm stopped eating, 830 the headache came back, vomiting, nausea, contant right-sided pain, photophobia, phonophobia, turning lights off helped and in a quiet room helped, pulsating, sleeping helped, stress made it worse. They went to the ER because it came back, she thre up the whole way to ER in the setting of a bad headache, always severe behind the right eye, no other symptoms or focal neurologic issues. She had a migraine cocktail and it resolved. Possibly knee could have been a trigger. She had another headache on Sunday and a slight fever (99.7), by 3pm she was fine this past Sunday, Mondat at 3pm she had another headache, neck pain, nausea, covid nehative, yesterday fine. Mondy she vomited from 3-10pm. So far fine today.  Worst headache of life, acute, intractable, very scary. Confusion/fogginess/cognitive changes. No other focal neurologic deficits, associated symptoms, inciting events or modifiable factors.   Reviewed notes, labs and imaging from outside physicians,  which showed:  05/31/2022: CTA H&N: EXAM: CT ANGIOGRAPHY HEAD   TECHNIQUE: Multidetector CT imaging of the head was performed using the standard protocol during bolus administration of intravenous contrast. Multiplanar CT image reconstructions and MIPs were obtained to evaluate the vascular anatomy.   RADIATION DOSE REDUCTION: This exam was performed according to the departmental dose-optimization program which includes automated exposure control, adjustment of the mA and/or kV according to patient size and/or use of iterative reconstruction technique.   CONTRAST:  50mL OMNIPAQUE IOHEXOL 350 MG/ML SOLN   COMPARISON:  Head CT yesterday.  Brain MRI 03/15/2020.   FINDINGS: CTA NECK   Skeleton: Bony remodeling, thickening of the right hemi-calvarium. No acute osseous abnormality identified.   Upper chest: Negative.   Other neck: Negative.   Aortic arch: 3 vessel arch configuration.  No arch atherosclerosis.   Right carotid system: Normal brachiocephalic artery and right CCA origin. Normal right CCA and right carotid bifurcation. Patent but somewhat diminutive appearing right ICA, normal to the skull base.   Left carotid system: Normal.  Left ICA is asymmetrically larger.   Vertebral arteries: Normal, left vertebral artery is dominant.   CTA HEAD   Posterior circulation: Dominant left V4 segment. Normal PICA origins. The right V4 segment is diminutive but contributes to the vertebrobasilar junction. Patent basilar artery without stenosis. Patent SCA and left PCA origin. Diminutive or absent right PCA. Posterior communicating arteries are diminutive or absent. Left PCA branches are within normal limits.   Anterior circulation: Both ICA siphons are patent to the carotid termini without plaque or stenosis, the  left siphon is larger. Left MCA and right ACA origins are normal. Both the right MCA and left ACA are virtually absent. Azygous ACA anatomy results (series  12, image 16) supplied from the right ICA terminus. ACA branches are within normal limits. Left MCA M1 segment and bifurcation are patent without stenosis. Left MCA branches are within normal limits.   Venous sinuses: Patent.   Anatomic variants: Dominant left vertebral artery.   Review of the MIP images confirms the above findings   IMPRESSION: 1. No evidence of emergent large vessel occlusion. 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   2. No evidence of intracranial aneurysm. No atherosclerosis in the head or neck.  12-27-2020:  20yoF referred for ambulatory EEG by Dr Arlyn Leak to assess for  occult seizures. Pt has history of stares. Had previous  ambulatory EEG 2018 sowing abnormal right hemisphere patterns but  no seizures. MRI shows large area of encephalomalacia of right  hemisphere.    Start date and time 12-19-2020 13 44  End date and time 12-21-2020 08 55   Total duration of recording 43 hours 14 min   Description of Procedure  This is a digital video ambulatory EEG with 18 channels of EEG  and one channel of limited EKG, recorded using the standard 10-20  electrode placement and reviewed by standard montages of the  ACNS.  The patient underwent baseline recording at the time of  hookup to assess background and ensure initial integrity of  recording.  The patient was instructed to indicate times of  typical symptoms as test progressed, and a patient event diary  was not available for review.There were no technologist comments  recorded.   Description of Ambulatory EEG  The EEG begins with the patient awake and reveals background  rhythms of 8-9 Hz, 25-40uV activity which is maximal posteriorly.   This posterior dominant rhythm attenuates symmetrically with eye   opening. There is persistent  superimposed slowing at 4-5Hz  on  the right, at times with isolated sharply contoured waveforms  best seen at T4. As the patient drowses there is drop out of  alpha frequencies and central vertex waves appear, followed by  L>R  sleep spindles and diffuse slowing of deeper stages of  sleep.    Events  No marked events and no electrographic seizures. No diary  available for this review   MRI 03/15/2020:  CLINICAL DATA:  Seizure   EXAM:  MRI HEAD WITHOUT CONTRAST   TECHNIQUE:  Multiplanar, multiecho pulse sequences of the brain and surrounding  structures were obtained without intravenous contrast.   COMPARISON:  None.   FINDINGS:  Brain: There is cystic encephalomalacia of most of the right  hemisphere. There is atrophy of rostrum of corpus callosum. No  abnormal diffusion restriction. The cerebellum is normal. The left  hemisphere appears normal. There is hyperintense T2-weighted signal  within the superior left periventricular white matter.   Vascular: Normal flow voids.   Skull and upper cervical spine: Normal marrow signal.   Sinuses/Orbits: Negative.   Other: None.   IMPRESSION:  1. No acute intracranial abnormality.  2. Cystic encephalomalacia of nearly the entire right cerebral  hemisphere.  3. 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     Review of Systems: Patient complains of symptoms per HPI as well as  the following symptoms ***. Pertinent negatives and positives per HPI. All others negative.   Social History   Socioeconomic History   Marital status: Single    Spouse name: Not on file   Number of children: Not on file   Years of education: Not on file   Highest education level: Not on file  Occupational History   Not on file  Tobacco Use   Smoking status: Never   Smokeless tobacco: Never  Vaping Use   Vaping Use:  Never used  Substance and Sexual Activity   Alcohol use: Not on file   Drug use: Never   Sexual activity: Not on file  Other Topics Concern   Not on file  Social History Narrative   Kerri Robertson is in the 12th grade at [employer]; she does well in school. She enjoys acting, tennis, bowling and singing. She lives with her mother and father.    Social Determinants of Health   Financial Resource Strain: Not on file  Food Insecurity: Not on file  Transportation Needs: Not on file  Physical Activity: Not on file  Stress: Not on file  Social Connections: Not on file  Intimate Partner Violence: Not on file    Family History  Problem Relation Age of Onset   Hypertension Mother    Migraines Mother    Depression Mother    Anxiety disorder Mother    Hypertension Father    Depression Father    Anxiety disorder Father    Hypertension Maternal Grandmother    Hypertension Maternal Grandfather    Heart disease Other    Diabetes Other    Seizures Neg Hx    Bipolar disorder Neg Hx    Schizophrenia Neg Hx    ADD / ADHD Neg Hx    Autism Neg Hx     Past Medical History:  Diagnosis Date   Allergy    Cerebral palsy ([name])    CP (cerebral palsy) ([name])    Stroke [facility])     Patient Active Problem List   Diagnosis Date Noted   CP (cerebral palsy) ([name]) 06/04/2022   Patellofemoral pain syndrome of left knee 06/03/2022   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     FEROSUL 325 (65 Fe) MG tablet Take 1 tablet (325 mg total) by mouth every morning. 90 tablet 1   Midazolam (NAYZILAM) 5 MG/0.1ML SOLN as needed.     ondansetron (ZOFRAN) 4 MG tablet Take 1 tablet (4 mg total) by mouth every 8 (eight) hours as needed for nausea or vomiting. (Patient taking differently: Take 4 mg by mouth as needed for nausea or vomiting.) 20 tablet 0   No current facility-administered medications for this visit.    Allergies as of 06/04/2022 - Review Complete 06/04/2022  Allergen Reaction Noted  Benzodiazepines Nausea And Vomiting and Other (See Comments) 07/12/2009   Lactose Other (See Comments) 07/12/2009   Latex Itching and Other (See Comments) 08/21/2021    Vitals: BP 104/67   Pulse 92   Ht 5\' 1"  (1.549 m)   Wt 113 lb (51.3 kg)   LMP 05/23/2022 (Exact Date)   BMI 21.35 kg/m  Last Weight:  Wt Readings from Last 1 Encounters:  06/04/22 113 lb (51.3 kg)   Last Height:   Ht Readings from Last 1 Encounters:  06/04/22 5\' 1"  (1.549 m)     Physical exam: Exam: Gen: NAD, conversant, well nourised, well groomed                     CV: RRR, no MRG. No Carotid Bruits. No peripheral edema, warm, nontender Eyes: Conjunctivae clear without exudates or hemorrhage  Neuro: Detailed Neurologic Exam  Speech:    Speech is normal; fluent and spontaneous with normal comprehension.  Cognition:    The patient is oriented to person, place, and time;     recent and remote memory intact;     language fluent;     normal attention, concentration,     fund of knowledge Cranial Nerves:    The pupils are equal, round, and reactive to light. The fundi are normal and spontaneous venous pulsations are present. Visual fields are full to finger confrontation. Extraocular movements are intact. Trigeminal sensation is intact and the muscles of mastication are normal. The face is  symmetric(slight assymtry of lids right closed more but unlikely clinically significant). The palate elevates in the midline. Hearing intact. Voice is normal. Shoulder shrug is normal. The tongue has normal motion without fasciculations.   Coordination:    Normal finger to nose and heel to shin right, spasticit on the left.   Gait: spastic  Motor Observation:    no involuntary movements noted. Contracture left wrist and elbow Tone:    Normal muscle tone right, increased left   Posture:    Posture is normal. normal erect    Strength: left hemiparesis and spasticity, right strength is V/V in the upper and lower limbs.      Sensation: intact to LT     Reflex Exam:  DTR's:    Deep tendon reflexes in the upper and lower extremities are normal right, brisk on the left .   Toes:    The toes are upgoing left and equiv on the right.   Clonus:    Clonus is absent.    Assessment/Plan:  Probably new onset migraine without aura.   Physical Therapy: Cervical myofascial pain, forward posture contributing to migraines and cervicalgia. Please evaluate and treat including dry needling, stretching, strengthening, manual therapy/massage, heating, TENS unit, exercising for scapular stabilization, pectoral stretching and rhomboid strengthening as clinically warranted as well as any other modality as recommended by evaluation.  Heat, cold, Topical creams (voltaren) can also help for the neck   - MRI brain w/wo contrast - Suppository phenergan nausea and migraine if vomiting and can't anything by mouth - Emergency: Rizatriptan: Please take one tablet RIGHT at the onset of your headache. If it does not improve the symptoms please take one additional tablet. Do not take more then 2 tablets in 24hrs. Do not take use more then 2 to 3 times in a week. Can take with zofran or phenergan or excedrin.        No orders of the defined types were placed in this encounter.  No  orders of the defined types  were placed in this encounter.   Cc: Terrilyn Saver, NP,  Terrilyn Saver, NP  Sarina Ill, MD  [facility] [address] [place], [place] [address]  Phone [phone] Fax [phone]

## 2022-06-04 NOTE — Therapy (Signed)
OUTPATIENT PHYSICAL THERAPY TREATMENT Progress Note Reporting Period 04/10/22 to 06/19/22  See note below for Objective Data and Assessment of Progress/Goals.        Patient Name: Kerri Robertson MRN: [id number] DOB:12-10-2000, 22 y.o., female Today's Date: 04/10/2022   END OF SESSION:  PT End of Session - 06/05/22 1544     Visit Number 10    Date for PT Re-Evaluation 06/19/22    PT Start Time 1545    Activity Tolerance Patient tolerated treatment well;No increased pain    Behavior During Therapy WFL for tasks assessed/performed                     Past Medical History:  Diagnosis Date   Allergy     Cerebral palsy ([place])     Stroke [facility]           Past Surgical History:  Procedure Laterality Date   ARM SURGERY        2013   FOOT SURGERY        2010   HAMSTRING LENGTHENING       TIBIA / FIBIA LENGTHENING            Patient Active Problem List    Diagnosis Date Noted   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   Balance problem 05/06/2018   Syncope 05/06/2018   History of cerebrovascular accident 11/18/2017   CP (cerebral palsy), spastic, 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[place]) 06/25/2017   Anxiety state 06/25/2017   Spasticity 06/25/2017   Allergic rhinitis 10/21/2016   Mild intermittent asthma 10/21/2016      PCP: Terrilyn Saver, NP   REFERRING PROVIDER: Terrilyn Saver, NP   REFERRING DIAG: G80.0 (ICD-10-CM) - CP (cerebral palsy), spastic, 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 I saw my neurologist and told me I had knots in my neck and I am very tight.    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 Rechecking CBC, iron, and vitamin D today. She has been on supplementation (refilling iron for her). Will make additional changes based on labs if indicated. PT referral placed to [facility] at her request. No acute concerns today.      PAIN:  Are you having pain? No   PRECAUTIONS: None   WEIGHT BEARING RESTRICTIONS: No   FALLS:  Has patient fallen in last 6 months? Yes. 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 Hobbies- Editor, commissioning, archery, baseball, tennis, dance    PLOF: Needs assistance with ADLs   PATIENT GOALS: Patient would like to improve her flexibility and balance.   NEXT MD  VISIT:    OBJECTIVE:    DIAGNOSTIC FINDINGS:  Radiology consult confirms lumbar scoliosis with apex at L2-3, but since patient is asymptomatic, recommend monitoring it and continue to strengthen.           COGNITION: Overall cognitive status: Within functional limits for tasks assessed                         SENSATION: Not tested     MUSCLE LENGTH: Hamstrings: Right 40 deg; Left 30 deg     POSTURE: left pelvic obliquity, weight shift right, and hips rotated to L with L LE held in IR, increased R WB     LOWER EXTREMITY ROM: Trunk lateraly flexed due to scoliosis, L hip unable to achieve neutral ER, abd limited, B hamstrings tight, L ankle does not achieve neutral DF or inversion. LUE with limited ROM at all joints, in all planes. she  wears a splint on her wrist at night.      LOWER EXTREMITY MMT: RLE 4/5, LLE with fluctuating tone throughout, HS very weak, hips also weak-MMT accuracy questionable due to her tone.   FUNCTIONAL TESTS:  5 times sit to stand: 14.77 Timed up and go (TUG): 13.75 Berg Balance Scale: 38   GAIT: Distance walked: clinic distances Assistive device utilized: None Level of assistance: Modified independence Comments: Step-to pattern;Decreased step length - left;Decreased stance time - left;Decreased weight shift to left;Shuffle;Ataxic;Lateral hip instability;Trunk rotated posteriorly on left      TODAY'S TREATMENT:                                                                                                                              DATE:  06/05/22 Reassess TUG, 5xSTS, and BERG NuStep L4 x51mins Stretching HS 30s each side BF restriction therapy to L quad 30-15-15-15 with short arc quad HS curls red 2x10  06/03/22 Nustep level 5 x 6 mintues Volleyball on and off airex Discussion of POC and possible causes of the knee and HA pains as well as what we can do to help with patient and her mom, reviewed use of brace with caregiver and patient STM to the upper traps, rhomboid and neck mms  05/28/22 Standing hip abduction yellow TB around knees 2x10 Standing Rt step up/hold with forward lunge to encouraged Lt hip extension x15 Supine Lt quad set with hip alignment correction 10x3 sec hold  Manual: passive stretch Lt hamstring with contract/relax, Lt quadriceps in prone, Lt butterfly and Lt adductor stretch, Lt patellar mobs all directions x2 bouts  Self-care: brace wear, any limitations with return to volunteering   05/26/22 THEREX Nustep L5 x 5 min Supine butterfly 2x30 sec Supine Knee extended hip ER x20 Supine bridge with clamshell red TB 2x10 S/L clamshell AAROM x30 S/L Hip ext AAROM x30 Prone hamstring curl x30 Prone hip ER knee bent iso hold 10x3 sec SELF CARE Discussed  appropriate knee brace for  improved patellar alignment with gait. Discussed stretching while in the bathroom to keep back of heel from cramping and to decrease any N/T into L foot.    05/07/22 NuStep L5 x 6 minutes Sit to stand on Air ex pad with red tband resistance for hip abd, 10 reps Resisted gait with 10#, x 4 in each direction. Standing reach/pull with RUE into horizontal abd, then horiz add, 10 reps each, including rotation and weight shift onto LLE.  Standing with back to wall, red physioball positioned in lower back, squat slightly and maintain while rotating trunk R/L 2 x 10  04/24/22 NuStep L5 x 6 minutes Supine legs over 65 cm physioball- rolled DKTC, B bridge, U bridge with R, then L leg, LTR Supine with LLE pushed against red physioball, min A for positioning, push and hold x 10 sec, 10 reps. Sit to stand from elevated mat, LLE slightly behind R and therapist facilitating weight shift onto LLE. Standing alternate taps on 4" step, min A for balance and facilitation of LLE in stance. Place LLE on 4" step, rotate to R while maintaining LLE in position to stretch medial L hip.   04/21/22 Nustep Level 4 x 6 minutes Leg press 20# 2x10, left only 4x5 Bridges ball b/n knees 2x10 HS, piriformis and adductor stretch on the left Crunches 3x6 Lower trunk rotations Feet on ball trunk rotations Feet on ball bridges 4" toe touches holding rail and then without Side stepping over objects Side step on and off airex holding and without On airex volleyball,    04/18/22 Bike L3 x 6 minutes Leg press 10 reps with BLE, 10 with LLE, 20#, emphasis on neutral hip rotation on L, mod A for the positioning. Seated hip abd against yellow Tband, able to hold LLE, but no true abd. 20 reps U paloff with RUE, yellow Tband, 2 x 10 reps, min TC to wt shift onto LLE and stabilize on L B side stepping over lines on the floor, x8 in each direction-Initially had much difficulty with balance, improved to  speed up with less assist. Sit <> stand x 10, therapist facilitated wt shift onto LLE.   04/16/22 NuStep L4 x 6 minutes. Supine clamshells against yellow Tband Supine bridge with yellow Tband resistance Standing/step with RLE forward and back, emphasize LLE position in stance, tends to hold leg in IR. Standing step back and out with RLE to improve L hip ER. Ambulated 1 x 80' emphasizing tall posture over LLE and neutral rotation at L hip in WB. Sit <> stand from elevated mat with mod facilitation to maintain WB through LLE. 10 reps  04/10/22   education   PATIENT EDUCATION:  Education details: impact spasticity and inflexibility has on gait and patella tracking during ambulation; encouraged increase in stretch; consider botox or other appropriate intervention with MD regarding spasticity (if needed); updates to HEP Person educated: Patient and Caregiver   Education method: Explanation Education comprehension: verbalized understanding   HOME EXERCISE PROGRAM: [id number]   ASSESSMENT:   CLINICAL IMPRESSION: Patient reports no falls since last visit. We completed a progress note today, she has met her TUG and BERG goals. We completed BFR therapy today to her L quad with a SAQ, she tolerates it really well and states she could feel her muscles working. Continue with strength and balance training.   OBJECTIVE IMPAIRMENTS: Abnormal gait, decreased balance, decreased coordination, decreased endurance, decreased mobility, difficulty walking, decreased ROM, decreased strength, increased fascial restrictions, increased muscle spasms, impaired flexibility,  impaired tone, improper body mechanics, and postural dysfunction.    ACTIVITY LIMITATIONS: standing, squatting, stairs, transfers, and locomotion level   PARTICIPATION LIMITATIONS: shopping, community activity, and occupation   PERSONAL FACTORS: 1 comorbidity: CP  are also affecting patient's functional outcome.    REHAB POTENTIAL: Good    CLINICAL DECISION MAKING: Stable/uncomplicated   EVALUATION COMPLEXITY: Moderate     GOALS: Goals reviewed with patient? Yes   SHORT TERM GOALS: Target date: 05/01/22 I with initial HEP Baseline: Goal status: ongoing   LONG TERM GOALS: Target date: 06/19/22   I with final HEP Baseline:  Goal status: INITIAL   2.  Improve TUG time to < 12 sec Baseline:  Goal status: ongoing 14.66s 06/05/22  3.  Improve 5 x STS to <12 Baseline:  Goal status:MET 06/05/22   4.  Increase BERG score to at least 44 Baseline:  Goal status: 45/56   5.  Patient will ambulate with normalized gait pattern, minimizing abnormal weight shifts and increasing LLE position to more neutral hip rotation. Baseline:  Goal status: ongoing     PLAN:   PT FREQUENCY: 1-2x/week   PT DURATION: 10 weeks   PLANNED INTERVENTIONS: Therapeutic exercises, Therapeutic activity, Neuromuscular re-education, Balance training, Gait training, Patient/Family education, Self Care, Joint mobilization, Dry Needling, Cryotherapy, Moist heat, Ionotophoresis 4mg /ml Dexamethasone, and Manual therapy   PLAN FOR NEXT SESSION: see if the STM helped, I did send a note to Dr. and to Dr. Jordan Likes   4:34 PM,06/05/22 08/04/22, PT

## 2022-06-04 NOTE — Assessment & Plan Note (Signed)
Completed orthotics today. Her shoes were a larger size so may need to make a smaller pair.

## 2022-06-05 ENCOUNTER — Encounter: Payer: Self-pay | Admitting: Neurology

## 2022-06-05 ENCOUNTER — Encounter: Payer: Self-pay | Admitting: Physical Therapy

## 2022-06-05 ENCOUNTER — Ambulatory Visit: Payer: BC Managed Care – PPO | Attending: Family Medicine

## 2022-06-05 DIAGNOSIS — R531 Weakness: Secondary | ICD-10-CM | POA: Diagnosis present

## 2022-06-05 DIAGNOSIS — M6281 Muscle weakness (generalized): Secondary | ICD-10-CM | POA: Diagnosis present

## 2022-06-05 DIAGNOSIS — R2681 Unsteadiness on feet: Secondary | ICD-10-CM | POA: Diagnosis present

## 2022-06-05 DIAGNOSIS — R278 Other lack of coordination: Secondary | ICD-10-CM

## 2022-06-05 DIAGNOSIS — I69954 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: Secondary | ICD-10-CM | POA: Diagnosis present

## 2022-06-05 DIAGNOSIS — G800 Spastic quadriplegic cerebral palsy: Secondary | ICD-10-CM

## 2022-06-05 DIAGNOSIS — R2689 Other abnormalities of gait and mobility: Secondary | ICD-10-CM

## 2022-06-05 DIAGNOSIS — R262 Difficulty in walking, not elsewhere classified: Secondary | ICD-10-CM | POA: Diagnosis present

## 2022-06-05 DIAGNOSIS — G43009 Migraine without aura, not intractable, without status migrainosus: Secondary | ICD-10-CM | POA: Insufficient documentation

## 2022-06-05 LAB — TSH RFX ON ABNORMAL TO FREE T4: TSH: 0.603 u[IU]/mL (ref 0.450–4.500)

## 2022-06-05 MED ORDER — UBRELVY 100 MG PO TABS: 100.0000 mg | ORAL_TABLET | ORAL | 11 refills | Status: AC | PRN

## 2022-06-05 NOTE — Telephone Encounter (Signed)
I spoke to the mother. 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  I called the pharmacy, they denied that they refused to fill the midazolam per manager they just didn;t have it in stock and had to order it.  Please call mother and let her know thanks

## 2022-06-05 NOTE — Telephone Encounter (Signed)
Call the pharmacist let them know the stroke wa sin utero this was CP and not a contrainidication for triptans (not like she JUST had a stroke) or I can do it. I'll call.

## 2022-06-06 ENCOUNTER — Encounter: Payer: Self-pay | Admitting: Family Medicine

## 2022-06-06 ENCOUNTER — Ambulatory Visit (INDEPENDENT_AMBULATORY_CARE_PROVIDER_SITE_OTHER): Payer: BC Managed Care – PPO | Admitting: Family Medicine

## 2022-06-06 VITALS — Ht 61.0 in | Wt 113.0 lb

## 2022-06-06 DIAGNOSIS — M222X2 Patellofemoral disorders, left knee: Secondary | ICD-10-CM | POA: Diagnosis not present

## 2022-06-06 NOTE — Assessment & Plan Note (Signed)
Completed custom orthotics

## 2022-06-06 NOTE — Progress Notes (Signed)
  Kerri Robertson - 22 y.o. female MRN [id number]  Date of birth: [birth year]  SUBJECTIVE:  Including CC & ROS.  No chief complaint on file.   Kerri Robertson is a 22 y.o. female that is  here for orthotics.    Review of Systems See HPI   HISTORY: Past Medical, Surgical, Social, and Family History Reviewed & Updated per EMR.   Pertinent Historical Findings include:  Past Medical History:  Diagnosis Date   Allergy    Cerebral palsy ([place])    CP (cerebral palsy) ([place])    Stroke ([place])     Past Surgical History:  Procedure Laterality Date   ARM SURGERY     2013   FOOT SURGERY     2010   HAMSTRING LENGTHENING     TIBIA / FIBIA LENGTHENING       PHYSICAL EXAM:  VS: Ht 5\' 1"  (1.549 m)   Wt 113 lb (51.3 kg)   LMP 05/23/2022 (Exact Date)   BMI 21.35 kg/m  Physical Exam Gen: NAD, alert, cooperative with exam, well-appearing MSK:  Neurovascularly intact    Patient was fitted for a standard, cushioned, semi-rigid orthotic. The orthotic was heated and afterward the patient stood on the orthotic blank positioned on the orthotic stand. The patient was positioned in subtalar neutral position and 10 degrees of ankle dorsiflexion in a weight bearing stance. After completion of molding, a stable base was applied to the orthotic blank. The blank was ground to a stable position for weight bearing. Size: 6 Pairs: 2 Base: Blue EVA Additional Posting and Padding: None The patient ambulated these, and they were very comfortable.     ASSESSMENT & PLAN:   Patellofemoral pain syndrome of left knee Completed custom orthotics

## 2022-06-10 ENCOUNTER — Telehealth: Payer: Self-pay | Admitting: Neurology

## 2022-06-10 ENCOUNTER — Ambulatory Visit: Payer: BC Managed Care – PPO

## 2022-06-10 DIAGNOSIS — R262 Difficulty in walking, not elsewhere classified: Secondary | ICD-10-CM

## 2022-06-10 DIAGNOSIS — R2689 Other abnormalities of gait and mobility: Secondary | ICD-10-CM

## 2022-06-10 DIAGNOSIS — R2681 Unsteadiness on feet: Secondary | ICD-10-CM

## 2022-06-10 DIAGNOSIS — M6281 Muscle weakness (generalized): Secondary | ICD-10-CM

## 2022-06-10 DIAGNOSIS — R278 Other lack of coordination: Secondary | ICD-10-CM

## 2022-06-10 DIAGNOSIS — R531 Weakness: Secondary | ICD-10-CM

## 2022-06-10 DIAGNOSIS — I69954 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: Secondary | ICD-10-CM

## 2022-06-10 DIAGNOSIS — G800 Spastic quadriplegic cerebral palsy: Secondary | ICD-10-CM | POA: Diagnosis not present

## 2022-06-10 NOTE — Therapy (Signed)
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   Balance problem 05/06/2018   Syncope 05/06/2018   History of cerebrovascular accident 11/18/2017   CP (cerebral palsy), spastic, 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[place]) 06/25/2017   Anxiety state 06/25/2017   Spasticity 06/25/2017   Allergic rhinitis 10/21/2016   Mild intermittent asthma 10/21/2016      PCP: Terrilyn Saver, NP   REFERRING PROVIDER: Terrilyn Saver, NP   REFERRING DIAG: G80.0 (ICD-10-CM) - CP (cerebral palsy), spastic, 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Kerri Robertson)    THERAPY DIAG:  Spastic quadriplegic cerebral palsy ([place])   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 Went to work and was fine but mostly sits at work. I went to the gym on the bike and it caused me a lot of pain I had to go home on the wheelchair. The pain went from my hip to knee and it is on and off pain. Has an appointment with the ortho in a few weeks unless there is a cancellation and  they can get in earlier.   PERTINENT HISTORY:     CP (cerebral palsy), spastic, 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Kerri Robertson) - Primary         She is stable, at baseline (ambulatory, but with with some gait abnormalities, balance issues, and weakness). Rechecking CBC, iron, and vitamin D today. She has been on supplementation (refilling iron for her). Will make additional changes based on labs if indicated. PT referral placed to [facility] at her request. No acute concerns today.      PAIN:  Are you having pain? No   PRECAUTIONS: None   WEIGHT BEARING RESTRICTIONS: No   FALLS:  Has patient fallen in last 6 months? Yes. 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 Hobbies- Editor, commissioning, archery, baseball, tennis, dance    PLOF: Needs assistance with ADLs   PATIENT GOALS: Patient would like to improve her flexibility and balance.   NEXT MD VISIT:    OBJECTIVE:    DIAGNOSTIC FINDINGS:  Radiology consult confirms lumbar scoliosis with apex at L2-3, but since patient is asymptomatic, recommend monitoring it and  continue to strengthen.           COGNITION: Overall cognitive status: Within functional limits for tasks assessed                         SENSATION: Not tested     MUSCLE LENGTH: Hamstrings: Right 40 deg; Left 30 deg     POSTURE: left pelvic obliquity, weight shift right, and hips rotated to L with L LE held in IR, increased R WB     LOWER EXTREMITY ROM: Trunk lateraly flexed due to scoliosis, L hip unable to achieve neutral ER, abd limited, B hamstrings tight, L ankle does not achieve neutral DF or inversion. LUE with limited ROM at all joints, in all planes. she wears a splint on her wrist at night.      LOWER EXTREMITY MMT: RLE 4/5, LLE with fluctuating tone throughout, HS very weak, hips also  weak-MMT accuracy questionable due to her tone.   FUNCTIONAL TESTS:  5 times sit to stand: 14.77 Timed up and go (TUG): 13.75 Berg Balance Scale: 38   GAIT: Distance walked: clinic distances Assistive device utilized: None Level of assistance: Modified independence Comments: Step-to pattern;Decreased step length - left;Decreased stance time - left;Decreased weight shift to left;Shuffle;Ataxic;Lateral hip instability;Trunk rotated posteriorly on left      TODAY'S TREATMENT:                                                                                                              DATE:  06/10/22 NuStep L5 x76mins  Stretching HS 30s x2 each side  Supine clamshells green 2x10 LAQ 3# 2x10 HS curls red 2x10  STM to upper traps, rhomboid, neck followed by passive stretching  06/05/22 Reassess TUG, 5xSTS, and BERG NuStep L4 x46mins Stretching HS 30s each side BF restriction therapy to L quad 30-15-15-15 with short arc quad HS curls red 2x10  06/03/22 Nustep level 5 x 6 mintues Volleyball on and off airex Discussion of POC and possible causes of the knee and HA pains as well as what we can do to help with patient and her mom, reviewed use of brace with caregiver and patient STM to the upper traps, rhomboid and neck mms  05/28/22 Standing hip abduction yellow TB around knees 2x10 Standing Rt step up/hold with forward lunge to encouraged Lt hip extension x15 Supine Lt quad set with hip alignment correction 10x3 sec hold  Manual: passive stretch Lt hamstring with contract/relax, Lt quadriceps in prone, Lt butterfly and Lt adductor stretch, Lt patellar mobs all directions x2 bouts  Self-care: brace wear, any limitations with return to volunteering   05/26/22 THEREX Nustep L5 x 5 min Supine butterfly 2x30 sec Supine Knee extended hip ER x20 Supine bridge with clamshell red TB 2x10 S/L clamshell AAROM x30 S/L Hip ext AAROM x30 Prone hamstring curl x30 Prone hip ER knee bent iso hold  10x3 sec SELF CARE Discussed appropriate knee brace for improved patellar alignment with gait. Discussed stretching while in the  bathroom to keep back of heel from cramping and to decrease any N/T into L foot.    05/07/22 NuStep L5 x 6 minutes Sit to stand on Air ex pad with red tband resistance for hip abd, 10 reps Resisted gait with 10#, x 4 in each direction. Standing reach/pull with RUE into horizontal abd, then horiz add, 10 reps each, including rotation and weight shift onto LLE.  Standing with back to wall, red physioball positioned in lower back, squat slightly and maintain while rotating trunk R/L 2 x 10  04/24/22 NuStep L5 x 6 minutes Supine legs over 65 cm physioball- rolled DKTC, B bridge, U bridge with R, then L leg, LTR Supine with LLE pushed against red physioball, min A for positioning, push and hold x 10 sec, 10 reps. Sit to stand from elevated mat, LLE slightly behind R and therapist facilitating weight shift onto LLE. Standing alternate taps on 4" step, min A for balance and facilitation of LLE in stance. Place LLE on 4" step, rotate to R while maintaining LLE in position to stretch medial L hip.   04/21/22 Nustep Level 4 x 6 minutes Leg press 20# 2x10, left only 4x5 Bridges ball b/n knees 2x10 HS, piriformis and adductor stretch on the left Crunches 3x6 Lower trunk rotations Feet on ball trunk rotations Feet on ball bridges 4" toe touches holding rail and then without Side stepping over objects Side step on and off airex holding and without On airex volleyball,    04/18/22 Bike L3 x 6 minutes Leg press 10 reps with BLE, 10 with LLE, 20#, emphasis on neutral hip rotation on L, mod A for the positioning. Seated hip abd against yellow Tband, able to hold LLE, but no true abd. 20 reps U paloff with RUE, yellow Tband, 2 x 10 reps, min TC to wt shift onto LLE and stabilize on L B side stepping over lines on the floor, x8 in each direction-Initially had much  difficulty with balance, improved to speed up with less assist. Sit <> stand x 10, therapist facilitated wt shift onto LLE.   04/16/22 NuStep L4 x 6 minutes. Supine clamshells against yellow Tband Supine bridge with yellow Tband resistance Standing/step with RLE forward and back, emphasize LLE position in stance, tends to hold leg in IR. Standing step back and out with RLE to improve L hip ER. Ambulated 1 x 80' emphasizing tall posture over LLE and neutral rotation at L hip in WB. Sit <> stand from elevated mat with mod facilitation to maintain WB through LLE. 10 reps  04/10/22   education   PATIENT EDUCATION:  Education details: impact spasticity and inflexibility has on gait and patella tracking during ambulation; encouraged increase in stretch; consider botox or other appropriate intervention with MD regarding spasticity (if needed); updates to HEP Person educated: Patient and Caregiver   Education method: Explanation Education comprehension: verbalized understanding   HOME EXERCISE PROGRAM: [id number]   ASSESSMENT:   CLINICAL IMPRESSION: Patient reports no falls since last visit but had knee pain that came and went from her hip down to her knee while she was exercising on the bike. Today we focused on some knee and hip stretching and strengthening and then worked on her neck. She continues to have lot of tightness and some trigger points in her R UT. Did STM and followed with passive stretching on UT and levator scapulae.   OBJECTIVE IMPAIRMENTS: Abnormal gait, decreased balance, decreased coordination, decreased endurance, decreased mobility, difficulty walking, decreased  ROM, decreased strength, increased fascial restrictions, increased muscle spasms, impaired flexibility, impaired tone, improper body mechanics, and postural dysfunction.    ACTIVITY LIMITATIONS: standing, squatting, stairs, transfers, and locomotion level   PARTICIPATION LIMITATIONS: shopping, community activity,  and occupation   PERSONAL FACTORS: 1 comorbidity: CP  are also affecting patient's functional outcome.    REHAB POTENTIAL: Good   CLINICAL DECISION MAKING: Stable/uncomplicated   EVALUATION COMPLEXITY: Moderate     GOALS: Goals reviewed with patient? Yes   SHORT TERM GOALS: Target date: 05/01/22 I with initial HEP Baseline: Goal status: ongoing   LONG TERM GOALS: Target date: 06/19/22   I with final HEP Baseline:  Goal status: INITIAL   2.  Improve TUG time to < 12 sec Baseline:  Goal status: ongoing 14.66s 06/05/22  3.  Improve 5 x STS to <12 Baseline:  Goal status:MET 06/05/22   4.  Increase BERG score to at least 44 Baseline:  Goal status: 45/56   5.  Patient will ambulate with normalized gait pattern, minimizing abnormal weight shifts and increasing LLE position to more neutral hip rotation. Baseline:  Goal status: ongoing     PLAN:   PT FREQUENCY: 1-2x/week   PT DURATION: 10 weeks   PLANNED INTERVENTIONS: Therapeutic exercises, Therapeutic activity, Neuromuscular re-education, Balance training, Gait training, Patient/Family education, Self Care, Joint mobilization, Dry Needling, Cryotherapy, Moist heat, Ionotophoresis 4mg /ml Dexamethasone, and Manual therapy   PLAN FOR NEXT SESSION: continue with LE stretching and strengthening for L knee and see how STM for neck went 11:10 AM,06/10/22 Lum Babe, PT

## 2022-06-10 NOTE — Telephone Encounter (Signed)
[employer] Josem Kaufmann: [id number] exp. 06/10/22-07/09/22, [place] Medicaid NPR sent to [facility] for open MRI [phone]

## 2022-06-11 NOTE — Therapy (Signed)
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   Balance problem 05/06/2018   Syncope 05/06/2018   History of cerebrovascular accident 11/18/2017   CP (cerebral palsy), spastic, 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[place]) 06/25/2017   Anxiety state 06/25/2017   Spasticity 06/25/2017   Allergic rhinitis 10/21/2016   Mild intermittent asthma 10/21/2016      PCP: Terrilyn Saver, NP   REFERRING PROVIDER: Terrilyn Saver, NP   REFERRING DIAG: G80.0 (ICD-10-CM) - CP (cerebral palsy), spastic, 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 Went to work and was fine but mostly sits at work. I went to the gym on the bike and it caused me a lot of pain I had to go home on the wheelchair. The pain went from my hip to knee and it is on and off pain. Has an appointment with the ortho in a few weeks unless there is a cancellation and  they can get in earlier.   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 Rechecking CBC, iron, and vitamin D today. She has been on supplementation (refilling iron for her). Will make additional changes based on labs if indicated. PT referral placed to [facility] at her request. No acute concerns today.      PAIN:  Are you having pain? No   PRECAUTIONS: None   WEIGHT BEARING RESTRICTIONS: No   FALLS:  Has patient fallen in last 6 months? Yes. 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 Hobbies- Editor, commissioning, archery, baseball, tennis, dance    PLOF: Needs assistance with ADLs   PATIENT GOALS: Patient would like to improve her flexibility and balance.   NEXT MD VISIT:    OBJECTIVE:    DIAGNOSTIC FINDINGS:  Radiology consult confirms lumbar scoliosis with apex at L2-3, but since patient is asymptomatic, recommend monitoring it and  continue to strengthen.           COGNITION: Overall cognitive status: Within functional limits for tasks assessed                         SENSATION: Not tested     MUSCLE LENGTH: Hamstrings: Right 40 deg; Left 30 deg     POSTURE: left pelvic obliquity, weight shift right, and hips rotated to L with L LE held in IR, increased R WB     LOWER EXTREMITY ROM: Trunk lateraly flexed due to scoliosis, L hip unable to achieve neutral ER, abd limited, B hamstrings tight, L ankle does not achieve neutral DF or inversion. LUE with limited ROM at all joints, in all planes. she wears a splint on her wrist at night.      LOWER EXTREMITY MMT: RLE 4/5, LLE with fluctuating tone throughout, HS very weak, hips also  weak-MMT accuracy questionable due to her tone.   FUNCTIONAL TESTS:  5 times sit to stand: 14.77 Timed up and go (TUG): 13.75 Berg Balance Scale: 38   GAIT: Distance walked: clinic distances Assistive device utilized: None Level of assistance: Modified independence Comments: Step-to pattern;Decreased step length - left;Decreased stance time - left;Decreased weight shift to left;Shuffle;Ataxic;Lateral hip instability;Trunk rotated posteriorly on left      TODAY'S TREATMENT:                                                                                                              DATE:  06/12/22 NuStep Seated hip abduction green Marching 2#  Step ups 4"  Stretching STM and MH to neck   06/10/22 NuStep L5 x33mins  Stretching HS 30s x2 each side  Supine clamshells green 2x10 LAQ 3# 2x10 HS curls red 2x10  STM to upper traps, rhomboid, neck followed by passive stretching  06/05/22 Reassess TUG, 5xSTS, and BERG NuStep L4 x65mins Stretching HS 30s each side BF restriction therapy to L quad 30-15-15-15 with short arc quad HS curls red 2x10  06/03/22 Nustep level 5 x 6 mintues Volleyball on and off airex Discussion of POC and possible causes of the knee and HA pains as well as what we can do to help with patient and her mom, reviewed use of brace with caregiver and patient STM to the upper traps, rhomboid and neck mms  05/28/22 Standing hip abduction yellow TB around knees 2x10 Standing Rt step up/hold with forward lunge to encouraged Lt hip extension x15 Supine Lt quad set with hip alignment correction 10x3 sec hold  Manual: passive stretch Lt hamstring with contract/relax, Lt quadriceps in prone, Lt butterfly and Lt adductor stretch, Lt patellar mobs all directions x2 bouts  Self-care: brace wear, any limitations with return to volunteering   05/26/22 THEREX Nustep L5 x 5 min Supine butterfly 2x30 sec Supine Knee extended hip ER x20 Supine bridge with clamshell red TB  2x10 S/L clamshell AAROM x30 S/L Hip ext AAROM x30 Prone hamstring curl x30 Prone hip ER knee bent  iso hold 10x3 sec SELF CARE Discussed appropriate knee brace for improved patellar alignment with gait. Discussed stretching while in the bathroom to keep back of heel from cramping and to decrease any N/T into L foot.    05/07/22 NuStep L5 x 6 minutes Sit to stand on Air ex pad with red tband resistance for hip abd, 10 reps Resisted gait with 10#, x 4 in each direction. Standing reach/pull with RUE into horizontal abd, then horiz add, 10 reps each, including rotation and weight shift onto LLE.  Standing with back to wall, red physioball positioned in lower back, squat slightly and maintain while rotating trunk R/L 2 x 10  04/24/22 NuStep L5 x 6 minutes Supine legs over 65 cm physioball- rolled DKTC, B bridge, U bridge with R, then L leg, LTR Supine with LLE pushed against red physioball, min A for positioning, push and hold x 10 sec, 10 reps. Sit to stand from elevated mat, LLE slightly behind R and therapist facilitating weight shift onto LLE. Standing alternate taps on 4" step, min A for balance and facilitation of LLE in stance. Place LLE on 4" step, rotate to R while maintaining LLE in position to stretch medial L hip.   04/21/22 Nustep Level 4 x 6 minutes Leg press 20# 2x10, left only 4x5 Bridges ball b/n knees 2x10 HS, piriformis and adductor stretch on the left Crunches 3x6 Lower trunk rotations Feet on ball trunk rotations Feet on ball bridges 4" toe touches holding rail and then without Side stepping over objects Side step on and off airex holding and without On airex volleyball,    04/18/22 Bike L3 x 6 minutes Leg press 10 reps with BLE, 10 with LLE, 20#, emphasis on neutral hip rotation on L, mod A for the positioning. Seated hip abd against yellow Tband, able to hold LLE, but no true abd. 20 reps U paloff with RUE, yellow Tband, 2 x 10 reps, min TC to wt shift onto  LLE and stabilize on L B side stepping over lines on the floor, x8 in each direction-Initially had much difficulty with balance, improved to speed up with less assist. Sit <> stand x 10, therapist facilitated wt shift onto LLE.   04/16/22 NuStep L4 x 6 minutes. Supine clamshells against yellow Tband Supine bridge with yellow Tband resistance Standing/step with RLE forward and back, emphasize LLE position in stance, tends to hold leg in IR. Standing step back and out with RLE to improve L hip ER. Ambulated 1 x 80' emphasizing tall posture over LLE and neutral rotation at L hip in WB. Sit <> stand from elevated mat with mod facilitation to maintain WB through LLE. 10 reps  04/10/22   education   PATIENT EDUCATION:  Education details: impact spasticity and inflexibility has on gait and patella tracking during ambulation; encouraged increase in stretch; consider botox or other appropriate intervention with MD regarding spasticity (if needed); updates to HEP Person educated: Patient and Caregiver   Education method: Explanation Education comprehension: verbalized understanding   HOME EXERCISE PROGRAM: [id number]   ASSESSMENT:   CLINICAL IMPRESSION: Patient reports no falls since last visit but had knee pain that came and went from her hip down to her knee while she was exercising on the bike. Today we focused on some knee and hip stretching and strengthening and then worked on her neck. She continues to have lot of tightness and some trigger points in her R UT. Did STM and followed with passive stretching on  UT and levator scapulae.   OBJECTIVE IMPAIRMENTS: Abnormal gait, decreased balance, decreased coordination, decreased endurance, decreased mobility, difficulty walking, decreased ROM, decreased strength, increased fascial restrictions, increased muscle spasms, impaired flexibility, impaired tone, improper body mechanics, and postural dysfunction.    ACTIVITY LIMITATIONS: standing,  squatting, stairs, transfers, and locomotion level   PARTICIPATION LIMITATIONS: shopping, community activity, and occupation   PERSONAL FACTORS: 1 comorbidity: CP  are also affecting patient's functional outcome.    REHAB POTENTIAL: Good   CLINICAL DECISION MAKING: Stable/uncomplicated   EVALUATION COMPLEXITY: Moderate     GOALS: Goals reviewed with patient? Yes   SHORT TERM GOALS: Target date: 05/01/22 I with initial HEP Baseline: Goal status: ongoing   LONG TERM GOALS: Target date: 06/19/22   I with final HEP Baseline:  Goal status: INITIAL   2.  Improve TUG time to < 12 sec Baseline:  Goal status: ongoing 14.66s 06/05/22  3.  Improve 5 x STS to <12 Baseline:  Goal status:MET 06/05/22   4.  Increase BERG score to at least 44 Baseline:  Goal status: 45/56 MET   5.  Patient will ambulate with normalized gait pattern, minimizing abnormal weight shifts and increasing LLE position to more neutral hip rotation. Baseline:  Goal status: ongoing     PLAN:   PT FREQUENCY: 1-2x/week   PT DURATION: 10 weeks   PLANNED INTERVENTIONS: Therapeutic exercises, Therapeutic activity, Neuromuscular re-education, Balance training, Gait training, Patient/Family education, Self Care, Joint mobilization, Dry Needling, Cryotherapy, Moist heat, Ionotophoresis 4mg /ml Dexamethasone, and Manual therapy   PLAN FOR NEXT SESSION: continue with LE stretching and strengthening for L knee and see how STM for neck went 4:24 PM,06/11/22 08/10/22, PT

## 2022-06-12 ENCOUNTER — Ambulatory Visit: Payer: BC Managed Care – PPO

## 2022-06-12 DIAGNOSIS — G800 Spastic quadriplegic cerebral palsy: Secondary | ICD-10-CM

## 2022-06-12 DIAGNOSIS — M6281 Muscle weakness (generalized): Secondary | ICD-10-CM

## 2022-06-12 DIAGNOSIS — R2681 Unsteadiness on feet: Secondary | ICD-10-CM

## 2022-06-12 DIAGNOSIS — R278 Other lack of coordination: Secondary | ICD-10-CM

## 2022-06-12 DIAGNOSIS — R262 Difficulty in walking, not elsewhere classified: Secondary | ICD-10-CM

## 2022-06-17 ENCOUNTER — Encounter: Payer: Self-pay | Admitting: Physical Therapy

## 2022-06-17 ENCOUNTER — Ambulatory Visit: Payer: BC Managed Care – PPO | Admitting: Physical Therapy

## 2022-06-17 DIAGNOSIS — M6281 Muscle weakness (generalized): Secondary | ICD-10-CM

## 2022-06-17 DIAGNOSIS — R262 Difficulty in walking, not elsewhere classified: Secondary | ICD-10-CM

## 2022-06-17 DIAGNOSIS — G800 Spastic quadriplegic cerebral palsy: Secondary | ICD-10-CM

## 2022-06-17 DIAGNOSIS — R2681 Unsteadiness on feet: Secondary | ICD-10-CM

## 2022-06-17 NOTE — Therapy (Signed)
OUTPATIENT PHYSICAL THERAPY TREATMENT   Patient Name: Kerri Robertson MRN: [id number] DOB:08-Nov-2000, 22 y.o., female Today's Date: 04/10/2022   END OF SESSION:  PT End of Session - 06/17/22 1614     Visit Number 13    Date for PT Re-Evaluation 06/19/22    PT Start Time 1613    PT Stop Time 1700    PT Time Calculation (min) 47 min                      Past Medical History:  Diagnosis Date   Allergy     Cerebral palsy ([place])     Stroke [facility])           Past Surgical History:  Procedure Laterality Date   ARM SURGERY        2013   FOOT SURGERY        2010   HAMSTRING LENGTHENING       TIBIA / FIBIA LENGTHENING            Patient Active Problem List    Diagnosis Date Noted   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   Balance problem 05/06/2018   Syncope 05/06/2018   History of cerebrovascular accident 11/18/2017   CP (cerebral palsy), spastic, 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Kerri Robertson) 06/25/2017   Anxiety state 06/25/2017   Spasticity 06/25/2017   Allergic rhinitis 10/21/2016   Mild intermittent asthma 10/21/2016      PCP: Terrilyn Saver, NP   REFERRING PROVIDER: Terrilyn Saver, NP   REFERRING DIAG: G80.0 (ICD-10-CM) - CP (cerebral palsy), spastic, 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Kerri Robertson)    THERAPY DIAG:  Spastic quadriplegic cerebral palsy (Kerri Robertson)   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 Rechecking CBC, iron, and vitamin D today. She has been on supplementation (refilling iron for her). Will make additional changes based on labs if indicated. PT referral placed to [facility] at her request. No acute concerns today.      PAIN:  Are you having pain? No   PRECAUTIONS: None   WEIGHT BEARING RESTRICTIONS: No   FALLS:  Has patient fallen in last 6 months? Yes. 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 Hobbies- Editor, commissioning, archery, baseball, tennis, dance    PLOF: Needs assistance with ADLs   PATIENT GOALS: Patient would like to improve her flexibility and balance.   NEXT MD VISIT:    OBJECTIVE:    DIAGNOSTIC FINDINGS:  Radiology consult confirms lumbar scoliosis with apex at L2-3, but since patient is asymptomatic, recommend monitoring it and continue to strengthen.           COGNITION: Overall cognitive status: Within  functional limits for tasks assessed                         SENSATION: Not tested     MUSCLE LENGTH: Hamstrings: Right 40 deg; Left 30 deg     POSTURE: left pelvic obliquity, weight shift right, and hips rotated to L with L LE held in IR, increased R WB     LOWER EXTREMITY ROM: Trunk lateraly flexed due to scoliosis, L hip unable to achieve neutral ER, abd limited, B hamstrings tight, L ankle does not achieve neutral DF or inversion. LUE with limited ROM at all joints, in all planes. she wears a splint on her wrist at night.      LOWER EXTREMITY MMT: RLE 4/5, LLE with fluctuating tone throughout, HS very weak, hips also weak-MMT accuracy questionable due to her tone.   FUNCTIONAL TESTS:  5 times sit to stand: 14.77 Timed up and go (TUG): 13.75 Berg  Balance Scale: 38   GAIT: Distance walked: clinic distances Assistive device utilized: None Level of assistance: Modified independence Comments: Step-to pattern;Decreased step length - left;Decreased stance time - left;Decreased weight shift to left;Shuffle;Ataxic;Lateral hip instability;Trunk rotated posteriorly on left      TODAY'S TREATMENT:                                                                                                              DATE:  06/17/22 Nustep level 5 x 6 minutes Standing volleyball Airex head turns, eyes closed, cone toe touches BFR protocol SAQ 30-15-15-15 STM to the bilateral upper traps and the neck area   06/12/22 NuStep L5 x66mns  Seated hip abduction green 2x10 Marching 2# 2x10 Step ups 4"  Passive stretching HS, SKTC, hip ER/IR 30s  SLR 2# 2x10 LLE Sidelying abduction LLE 2# 2x10 Fitter pushes 2x10 Volleyball hits Volleyball kicks x5 each side    06/10/22 NuStep L5 x664ms  Stretching HS 30s x2 each side  Supine clamshells green 2x10 LAQ 3# 2x10 HS curls red 2x10  STM to upper traps, rhomboid, neck followed by passive stretching  06/05/22 Reassess TUG, 5xSTS, and BERG NuStep L4 x6m73m Stretching HS 30s each side BF restriction therapy to L quad 30-15-15-15 with short arc quad HS curls red 2x10  06/03/22 Nustep level 5 x 6 mintues Volleyball on and off airex Discussion of POC and possible causes of the knee and HA pains as well as what we can do to help with patient and her mom, reviewed use of brace with caregiver and patient STM to the upper traps, rhomboid and neck mms  05/28/22 Standing hip abduction yellow TB around knees 2x10 Standing Rt step up/hold with forward lunge to encouraged Lt hip extension x15 Supine Lt quad set with hip alignment correction 10x3 sec hold  Manual: passive stretch Lt hamstring with contract/relax, Lt quadriceps in prone, Lt butterfly and Lt adductor stretch, Lt patellar mobs all directions x2 bouts   Self-care: brace wear, any limitations with return to volunteering   05/26/22  THEREX Nustep L5 x 5 min Supine butterfly 2x30 sec Supine Knee extended hip ER x20 Supine bridge with clamshell red TB 2x10 S/L clamshell AAROM x30 S/L Hip ext AAROM x30 Prone hamstring curl x30 Prone hip ER knee bent iso hold 10x3 sec SELF CARE Discussed appropriate knee brace for improved patellar alignment with gait. Discussed stretching while in the bathroom to keep back of heel from cramping and to decrease any N/T into L foot.    05/07/22 NuStep L5 x 6 minutes Sit to stand on Air ex pad with red tband resistance for hip abd, 10 reps Resisted gait with 10#, x 4 in each direction. Standing reach/pull with RUE into horizontal abd, then horiz add, 10 reps each, including rotation and weight shift onto LLE.  Standing with back to wall, red physioball positioned in lower back, squat slightly and maintain while rotating trunk R/L 2 x 10  04/24/22 NuStep L5 x 6 minutes Supine legs over 65 cm physioball- rolled DKTC, B bridge, U bridge with R, then L leg, LTR Supine with LLE pushed against red physioball, min A for positioning, push and hold x 10 sec, 10 reps. Sit to stand from elevated mat, LLE slightly behind R and therapist facilitating weight shift onto LLE. Standing alternate taps on 4" step, min A for balance and facilitation of LLE in stance. Place LLE on 4" step, rotate to R while maintaining LLE in position to stretch medial L hip.   04/21/22 Nustep Level 4 x 6 minutes Leg press 20# 2x10, left only 4x5 Bridges ball b/n knees 2x10 HS, piriformis and adductor stretch on the left Crunches 3x6 Lower trunk rotations Feet on ball trunk rotations Feet on ball bridges 4" toe touches holding rail and then without Side stepping over objects Side step on and off airex holding and without On airex volleyball,    PATIENT EDUCATION:  Education details: impact spasticity and inflexibility has on gait  and patella tracking during ambulation; encouraged increase in stretch; consider botox or other appropriate intervention with MD regarding spasticity (if needed); updates to HEP Person educated: Patient and Caregiver   Education method: Explanation Education comprehension: verbalized understanding   HOME EXERCISE PROGRAM: [id number]   ASSESSMENT:   CLINICAL IMPRESSION: Patient denies any pain upon session. She is very tight in the neck and the upper traps, she needs cues to relax but tends to elevate the shoulders.  She does well with the BFR without pain.  With the balance today she did very well and was able to move a lot and hit the beach ball with pretty good control  OBJECTIVE IMPAIRMENTS: Abnormal gait, decreased balance, decreased coordination, decreased endurance, decreased mobility, difficulty walking, decreased ROM, decreased strength, increased fascial restrictions, increased muscle spasms, impaired flexibility, impaired tone, improper body mechanics, and postural dysfunction.    ACTIVITY LIMITATIONS: standing, squatting, stairs, transfers, and locomotion level   PARTICIPATION LIMITATIONS: shopping, community activity, and occupation   PERSONAL FACTORS: 1 comorbidity: CP  are also affecting patient's functional outcome.    REHAB POTENTIAL: Good   CLINICAL DECISION MAKING: Stable/uncomplicated   EVALUATION COMPLEXITY: Moderate     GOALS: Goals reviewed with patient? Yes   SHORT TERM GOALS: Target date: 05/01/22 I with initial HEP Baseline: Goal status: ongoing   LONG TERM GOALS: Target date: 06/19/22   I with final HEP Baseline:  Goal status: INITIAL   2.  Improve TUG time to < 12 sec Baseline:  Goal status: ongoing 14.66s 06/05/22  3.  Improve  5 x STS to <12 Baseline:  Goal status:MET 06/05/22   4.  Increase BERG score to at least 44 Baseline:  Goal status: 45/56 MET   5.  Patient will ambulate with normalized gait pattern, minimizing abnormal weight shifts  and increasing LLE position to more neutral hip rotation. Baseline:  Goal status: ongoing     PLAN:   PT FREQUENCY: 1-2x/week   PT DURATION: 10 weeks   PLANNED INTERVENTIONS: Therapeutic exercises, Therapeutic activity, Neuromuscular re-education, Balance training, Gait training, Patient/Family education, Self Care, Joint mobilization, Dry Needling, Cryotherapy, Moist heat, Ionotophoresis 23m/ml Dexamethasone, and Manual therapy   PLAN FOR NEXT SESSION: continue with LE stretching and strengthening for L knee and see how STM for neck went 4:19 PM,06/17/22 MLum Babe PT

## 2022-06-24 ENCOUNTER — Ambulatory Visit: Payer: BC Managed Care – PPO

## 2022-06-25 ENCOUNTER — Encounter: Payer: Self-pay | Admitting: Physical Therapy

## 2022-06-25 NOTE — Therapy (Signed)
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   Balance problem 05/06/2018   Syncope 05/06/2018   History of cerebrovascular accident 11/18/2017   CP (cerebral palsy), spastic, 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[place]) 06/25/2017   Anxiety state 06/25/2017   Spasticity 06/25/2017   Allergic rhinitis 10/21/2016   Mild intermittent asthma 10/21/2016      PCP: Terrilyn Saver, NP   REFERRING PROVIDER: Terrilyn Saver, NP   REFERRING DIAG: G80.0 (ICD-10-CM) - CP (cerebral palsy), spastic, 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[place])    THERAPY DIAG:  Spastic quadriplegic cerebral palsy ([place])   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 Rechecking CBC, iron, and vitamin D today. She has been on supplementation (refilling iron for her).  Will make additional changes based on labs if indicated. PT referral placed to [facility] at her request. No acute concerns today.      PAIN:  Are you having pain? No   PRECAUTIONS: None   WEIGHT BEARING RESTRICTIONS: No   FALLS:  Has patient fallen in last 6 months? Yes. 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 Hobbies- Editor, commissioning, archery, baseball, tennis, dance    PLOF: Needs assistance with ADLs   PATIENT GOALS: Patient would like to improve her flexibility and balance.   NEXT MD VISIT:    OBJECTIVE:    DIAGNOSTIC FINDINGS:  Radiology consult confirms lumbar scoliosis with apex at L2-3, but since patient is asymptomatic, recommend monitoring it and continue to strengthen.           COGNITION: Overall cognitive status: Within functional limits for tasks assessed                         SENSATION: Not tested     MUSCLE LENGTH: Hamstrings: Right 40 deg; Left 30 deg     POSTURE: left pelvic obliquity, weight shift  right, and hips rotated to L with L LE held in IR, increased R WB     LOWER EXTREMITY ROM: Trunk lateraly flexed due to scoliosis, L hip unable to achieve neutral ER, abd limited, B hamstrings tight, L ankle does not achieve neutral DF or inversion. LUE with limited ROM at all joints, in all planes. she wears a splint on her wrist at night.      LOWER EXTREMITY MMT: RLE 4/5, LLE with fluctuating tone throughout, HS very weak, hips also weak-MMT accuracy questionable due to her tone.   FUNCTIONAL TESTS:  5 times sit to stand: 14.77 Timed up and go (TUG): 13.75 Berg Balance Scale: 38   GAIT: Distance walked: clinic distances Assistive device utilized: None Level of assistance: Modified independence Comments: Step-to pattern;Decreased step length -  left;Decreased stance time - left;Decreased weight shift to left;Shuffle;Ataxic;Lateral hip instability;Trunk rotated posteriorly on left      TODAY'S TREATMENT:                                                                                                              DATE:  06/26/22 NuStep Leg press Step ups on airex  Box taps  Side steps with band along mat table   06/17/22 Nustep level 5 x 6 minutes Standing volleyball Airex head turns, eyes closed, cone toe touches BFR protocol SAQ 30-15-15-15 STM to the bilateral upper traps and the neck area   06/12/22 NuStep L5 x43mns  Seated hip abduction green 2x10 Marching 2# 2x10 Step ups 4"  Passive stretching HS, SKTC, hip ER/IR 30s  SLR 2# 2x10 LLE Sidelying abduction LLE 2# 2x10 Fitter pushes 2x10 Volleyball hits Volleyball kicks x5 each side    06/10/22 NuStep L5 x684ms  Stretching HS 30s x2 each side  Supine clamshells green 2x10 LAQ 3# 2x10 HS curls red 2x10  STM to upper traps, rhomboid, neck followed by passive stretching  06/05/22 Reassess TUG, 5xSTS, and BERG NuStep L4 x6m27m Stretching HS 30s each side BF restriction therapy to L quad 30-15-15-15 with short arc quad HS curls red 2x10  06/03/22 Nustep level 5 x 6 mintues Volleyball on and off airex Discussion of POC and possible causes of the knee and HA pains as well as what we can do to help with patient and her mom, reviewed use of brace with caregiver and patient STM to the upper traps, rhomboid and neck mms  05/28/22 Standing hip abduction yellow TB around knees 2x10 Standing Rt step up/hold with forward lunge to encouraged Lt hip extension x15 Supine Lt quad set with hip alignment correction 10x3 sec hold  Manual: passive stretch Lt hamstring with contract/relax, Lt quadriceps in prone, Lt butterfly and Lt adductor stretch, Lt patellar mobs all directions x2 bouts  Self-care: brace wear, any limitations with return to  volunteering   05/26/22 THEREX Nustep L5 x 5 min Supine butterfly 2x30 sec Supine Knee extended hip ER x20 Supine bridge with clamshell red TB 2x10 S/L clamshell AAROM x30 S/L Hip ext AAROM x30 Prone hamstring  curl x30 Prone hip ER knee bent iso hold 10x3 sec SELF CARE Discussed appropriate knee brace for improved patellar alignment with gait. Discussed stretching while in the bathroom to keep back of heel from cramping and to decrease any N/T into L foot.    05/07/22 NuStep L5 x 6 minutes Sit to stand on Air ex pad with red tband resistance for hip abd, 10 reps Resisted gait with 10#, x 4 in each direction. Standing reach/pull with RUE into horizontal abd, then horiz add, 10 reps each, including rotation and weight shift onto LLE.  Standing with back to wall, red physioball positioned in lower back, squat slightly and maintain while rotating trunk R/L 2 x 10  04/24/22 NuStep L5 x 6 minutes Supine legs over 65 cm physioball- rolled DKTC, B bridge, U bridge with R, then L leg, LTR Supine with LLE pushed against red physioball, min A for positioning, push and hold x 10 sec, 10 reps. Sit to stand from elevated mat, LLE slightly behind R and therapist facilitating weight shift onto LLE. Standing alternate taps on 4" step, min A for balance and facilitation of LLE in stance. Place LLE on 4" step, rotate to R while maintaining LLE in position to stretch medial L hip.   04/21/22 Nustep Level 4 x 6 minutes Leg press 20# 2x10, left only 4x5 Bridges ball b/n knees 2x10 HS, piriformis and adductor stretch on the left Crunches 3x6 Lower trunk rotations Feet on ball trunk rotations Feet on ball bridges 4" toe touches holding rail and then without Side stepping over objects Side step on and off airex holding and without On airex volleyball,    PATIENT EDUCATION:  Education details: impact spasticity and inflexibility has on gait and patella tracking during ambulation; encouraged  increase in stretch; consider botox or other appropriate intervention with MD regarding spasticity (if needed); updates to HEP Person educated: Patient and Caregiver   Education method: Explanation Education comprehension: verbalized understanding   HOME EXERCISE PROGRAM: [id number]   ASSESSMENT:   CLINICAL IMPRESSION: Patient denies any pain upon session. She is very tight in the neck and the upper traps, she needs cues to relax but tends to elevate the shoulders.  She does well with the BFR without pain.  With the balance today she did very well and was able to move a lot and hit the beach ball with pretty good control  OBJECTIVE IMPAIRMENTS: Abnormal gait, decreased balance, decreased coordination, decreased endurance, decreased mobility, difficulty walking, decreased ROM, decreased strength, increased fascial restrictions, increased muscle spasms, impaired flexibility, impaired tone, improper body mechanics, and postural dysfunction.    ACTIVITY LIMITATIONS: standing, squatting, stairs, transfers, and locomotion level   PARTICIPATION LIMITATIONS: shopping, community activity, and occupation   PERSONAL FACTORS: 1 comorbidity: CP  are also affecting patient's functional outcome.    REHAB POTENTIAL: Good   CLINICAL DECISION MAKING: Stable/uncomplicated   EVALUATION COMPLEXITY: Moderate     GOALS: Goals reviewed with patient? Yes   SHORT TERM GOALS: Target date: 05/01/22 I with initial HEP Baseline: Goal status: ongoing   LONG TERM GOALS: Target date: 06/19/22   I with final HEP Baseline:  Goal status: INITIAL   2.  Improve TUG time to < 12 sec Baseline:  Goal status: ongoing 14.66s 06/05/22  3.  Improve 5 x STS to <12 Baseline:  Goal status:MET 06/05/22   4.  Increase BERG score to at least 44 Baseline:  Goal status: 45/56 MET   5.  Patient will ambulate  with normalized gait pattern, minimizing abnormal weight shifts and increasing LLE position to more neutral hip  rotation. Baseline:  Goal status: ongoing     PLAN:   PT FREQUENCY: 1-2x/week   PT DURATION: 10 weeks   PLANNED INTERVENTIONS: Therapeutic exercises, Therapeutic activity, Neuromuscular re-education, Balance training, Gait training, Patient/Family education, Self Care, Joint mobilization, Dry Needling, Cryotherapy, Moist heat, Ionotophoresis 51m/ml Dexamethasone, and Manual therapy   PLAN FOR NEXT SESSION: continue with LE stretching and strengthening for L knee and see how STM for neck went 4:36 PM,06/25/22 MLum Babe PT

## 2022-06-26 ENCOUNTER — Ambulatory Visit: Payer: BC Managed Care – PPO

## 2022-06-26 DIAGNOSIS — R278 Other lack of coordination: Secondary | ICD-10-CM

## 2022-06-26 DIAGNOSIS — M6281 Muscle weakness (generalized): Secondary | ICD-10-CM

## 2022-06-26 DIAGNOSIS — G800 Spastic quadriplegic cerebral palsy: Secondary | ICD-10-CM

## 2022-06-26 DIAGNOSIS — R262 Difficulty in walking, not elsewhere classified: Secondary | ICD-10-CM

## 2022-06-26 DIAGNOSIS — R2681 Unsteadiness on feet: Secondary | ICD-10-CM

## 2022-06-30 ENCOUNTER — Telehealth: Payer: Self-pay | Admitting: *Deleted

## 2022-06-30 NOTE — Telephone Encounter (Signed)
There Is an MRI brain on [place] from 2021 that should have been compared to this one. Can you call [facility] and ask them to addend report to compare this MRi from the one on [place] from 2021? That is a much better comparison that a CT one month ago.  And addend findings. You can Location manager isom. Thanks

## 2022-06-30 NOTE — Telephone Encounter (Signed)
   IMPRESSION:  1. 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 Cystic dilation of the right lateral ventricle as well. Atrophy of the right cerebral peduncle, and the corpus callosum as above.  2.  Associated decrease in size of the skull on the right and right enophthalmos  3.  No acute intracranial abnormality.    Electronically Signed by: Ed Blalock MD, PHD on 06/26/2022 1:48 PM

## 2022-07-01 ENCOUNTER — Ambulatory Visit: Payer: BC Managed Care – PPO | Admitting: Physical Therapy

## 2022-07-01 ENCOUNTER — Encounter: Payer: Self-pay | Admitting: Physical Therapy

## 2022-07-01 DIAGNOSIS — R262 Difficulty in walking, not elsewhere classified: Secondary | ICD-10-CM

## 2022-07-01 DIAGNOSIS — G800 Spastic quadriplegic cerebral palsy: Secondary | ICD-10-CM | POA: Diagnosis not present

## 2022-07-01 DIAGNOSIS — R2681 Unsteadiness on feet: Secondary | ICD-10-CM

## 2022-07-01 DIAGNOSIS — M6281 Muscle weakness (generalized): Secondary | ICD-10-CM

## 2022-07-01 NOTE — Therapy (Signed)
OUTPATIENT PHYSICAL THERAPY TREATMENT   Patient Name: Kerri Robertson MRN: [id number] DOB:18-Jun-2000, 22 y.o., female Today's Date: 04/10/2022   END OF SESSION:  PT End of Session - 06/26/22 1544     Visit Number 14    Date for PT Re-Evaluation 07/03/22    PT Start Time 1545    PT Stop Time 1630    PT Time Calculation (min) 45 min    Activity Tolerance Patient tolerated treatment well                       Past Medical History:  Diagnosis Date   Allergy     Cerebral palsy ([place])     Stroke [facility])           Past Surgical History:  Procedure Laterality Date   ARM SURGERY        2013   FOOT SURGERY        2010   HAMSTRING LENGTHENING       TIBIA / FIBIA LENGTHENING            Patient Active Problem List    Diagnosis Date Noted   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   Balance problem 05/06/2018   Syncope 05/06/2018   History of cerebrovascular accident 11/18/2017   CP (cerebral palsy), spastic, 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[place]) 06/25/2017   Anxiety state 06/25/2017   Spasticity 06/25/2017   Allergic rhinitis 10/21/2016   Mild intermittent asthma 10/21/2016      PCP: Terrilyn Saver, NP   REFERRING PROVIDER: Terrilyn Saver, NP   REFERRING DIAG: G80.0 (ICD-10-CM) - CP (cerebral palsy), spastic, 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[place])    THERAPY DIAG:  Spastic quadriplegic cerebral palsy ([place])   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)   Other lack of coordination   Unsteadiness on feet   Difficulty in walking, not elsewhere classified   Left-sided weakness   Muscle weakness (generalized)   Abnormal posture   Cerebral palsy with spastic diplegia ([place])   Rationale for Evaluation and Treatment: Rehabilitation   ONSET DATE: 03/17/22   SUBJECTIVE:    SUBJECTIVE STATEMENT: [place], she does report tightness in the  neck  PERTINENT HISTORY:     CP (cerebral palsy), spastic, 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 Rechecking CBC, iron, and vitamin D today. She has been on supplementation (refilling iron for her). Will make additional changes based on labs if indicated. PT referral placed to [facility] at her request. No acute concerns today.      PAIN:  Are you having pain? No   PRECAUTIONS: None   WEIGHT BEARING RESTRICTIONS: No   FALLS:  Has patient fallen in last 6 months? Yes. 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 Hobbies- Editor, commissioning, archery, baseball, tennis, dance    PLOF: Needs assistance with ADLs   PATIENT GOALS: Patient would like to improve her flexibility and balance.   NEXT MD VISIT:    OBJECTIVE:    DIAGNOSTIC FINDINGS:  Radiology consult confirms lumbar scoliosis with apex at L2-3, but since patient is asymptomatic, recommend  monitoring it and continue to strengthen.           COGNITION: Overall cognitive status: Within functional limits for tasks assessed                         SENSATION: Not tested     MUSCLE LENGTH: Hamstrings: Right 40 deg; Left 30 deg     POSTURE: left pelvic obliquity, weight shift right, and hips rotated to L with L LE held in IR, increased R WB     LOWER EXTREMITY ROM: Trunk lateraly flexed due to scoliosis, L hip unable to achieve neutral ER, abd limited, B hamstrings tight, L ankle does not achieve neutral DF or inversion. LUE with limited ROM at all joints, in all planes. she wears a splint on her wrist at night.      LOWER EXTREMITY MMT: RLE 4/5, LLE with fluctuating tone throughout, HS very weak, hips also weak-MMT accuracy  questionable due to her tone.   FUNCTIONAL TESTS:  5 times sit to stand: 14.77 Timed up and go (TUG): 13.75 Berg Balance Scale: 38   GAIT: Distance walked: clinic distances Assistive device utilized: None Level of assistance: Modified independence Comments: Step-to pattern;Decreased step length - left;Decreased stance time - left;Decreased weight shift to left;Shuffle;Ataxic;Lateral hip instability;Trunk rotated posteriorly on left      TODAY'S TREATMENT:                                                                                                              DATE:  07/01/22 Nustep level 5 x 6 minutes Feet on ball K2C, trunk rotation, small bridge and isometric abs Sliding board and pillow case left hip abduction in supine Supine hip ER with some assist On airex volleyball On airex marching Hooklying working on left hip abduction yellow tband SAQ with 2.5# STM to the upper traps and neck  06/26/22 NuStep L5 x20mns  Leg press 20# 2x10 Step ups on airex- discontinued  Box taps 4" minA CGA 2x10  Side steps with band along mat table red  BFR protocol SAQ 30-15-15-15   06/17/22 Nustep level 5 x 6 minutes Standing volleyball Airex head turns, eyes closed, cone toe touches BFR protocol SAQ 30-15-15-15 STM to the bilateral upper traps and the neck area   06/12/22 NuStep L5 x66ms  Seated hip abduction green 2x10 Marching 2# 2x10 Step ups 4"  Passive stretching HS, SKTC, hip ER/IR 30s  SLR 2# 2x10 LLE Sidelying abduction LLE 2# 2x10 Fitter pushes 2x10 Volleyball hits Volleyball kicks x5 each side    06/10/22 NuStep L5 x6m65m  Stretching HS 30s x2 each side  Supine clamshells green 2x10 LAQ 3# 2x10 HS curls red 2x10  STM to upper traps, rhomboid, neck followed by passive stretching  06/05/22 Reassess TUG, 5xSTS, and BERG NuStep L4 x6mi33mStretching HS 30s each side BF restriction therapy to L quad 30-15-15-15 with short arc quad HS curls red  2x10  06/03/22 Nustep level 5 x 6 mintues Volleyball on and  off airex Discussion of POC and possible causes of the knee and HA pains as well as what we can do to help with patient and her mom, reviewed use of brace with caregiver and patient STM to the upper traps, rhomboid and neck mms  05/28/22 Standing hip abduction yellow TB around knees 2x10 Standing Rt step up/hold with forward lunge to encouraged Lt hip extension x15 Supine Lt quad set with hip alignment correction 10x3 sec hold  Manual: passive stretch Lt hamstring with contract/relax, Lt quadriceps in prone, Lt butterfly and Lt adductor stretch, Lt patellar mobs all directions x2 bouts  Self-care: brace wear, any limitations with return to volunteering  PATIENT EDUCATION:  Education details: impact spasticity and inflexibility has on gait and patella tracking during ambulation; encouraged increase in stretch; consider botox or other appropriate intervention with MD regarding spasticity (if needed); updates to HEP Person educated: Patient and Caregiver   Education method: Explanation Education comprehension: verbalized understanding   HOME EXERCISE PROGRAM: [id number]   ASSESSMENT:   CLINICAL IMPRESSION: Patient is doing well, she had a fall about 10-12 days ago, she has the tightness int he upper traps and the neck area, the neck feels less tight than previously, I tried to focus more on the left LE strength today OBJECTIVE IMPAIRMENTS: Abnormal gait, decreased balance, decreased coordination, decreased endurance, decreased mobility, difficulty walking, decreased ROM, decreased strength, increased fascial restrictions, increased muscle spasms, impaired flexibility, impaired tone, improper body mechanics, and postural dysfunction.    ACTIVITY LIMITATIONS: standing, squatting, stairs, transfers, and locomotion level   PARTICIPATION LIMITATIONS: shopping, community activity, and occupation   PERSONAL FACTORS: 1 comorbidity: CP   are also affecting patient's functional outcome.    REHAB POTENTIAL: Good   CLINICAL DECISION MAKING: Stable/uncomplicated   EVALUATION COMPLEXITY: Moderate     GOALS: Goals reviewed with patient? Yes   SHORT TERM GOALS: Target date: 05/01/22 I with initial HEP Baseline: Goal status: ongoing   LONG TERM GOALS: Target date: 06/19/22   I with final HEP Baseline:  Goal status: INITIAL   2.  Improve TUG time to < 12 sec Baseline:  Goal status: ongoing 14.66s 06/05/22  3.  Improve 5 x STS to <12 Baseline:  Goal status:MET 06/05/22   4.  Increase BERG score to at least 44 Baseline:  Goal status: 45/56 MET   5.  Patient will ambulate with normalized gait pattern, minimizing abnormal weight shifts and increasing LLE position to more neutral hip rotation. Baseline:  Goal status: ongoing     PLAN:   PT FREQUENCY: 1-2x/week   PT DURATION: 10 weeks   PLANNED INTERVENTIONS: Therapeutic exercises, Therapeutic activity, Neuromuscular re-education, Balance training, Gait training, Patient/Family education, Self Care, Joint mobilization, Dry Needling, Cryotherapy, Moist heat, Ionotophoresis '4mg'$ /ml Dexamethasone, and Manual therapy   PLAN FOR NEXT SESSION: continue with LE stretching and strengthening for L knee and see how STM for neck went 4:30 PM,06/26/22 Lum Babe, PT

## 2022-07-01 NOTE — Telephone Encounter (Signed)
I called [facility] again. Kerri Robertson was busy. The rep who answered will discuss with [facility]. I requested updated report when complete.

## 2022-07-01 NOTE — Telephone Encounter (Signed)
Spoke with [place] @ [facility]. She is requesting the images from [facility] for the addendum.

## 2022-07-02 ENCOUNTER — Encounter: Payer: Self-pay | Admitting: Family Medicine

## 2022-07-02 ENCOUNTER — Ambulatory Visit: Payer: BC Managed Care – PPO | Admitting: Family Medicine

## 2022-07-02 ENCOUNTER — Ambulatory Visit (INDEPENDENT_AMBULATORY_CARE_PROVIDER_SITE_OTHER): Payer: BC Managed Care – PPO | Admitting: Family Medicine

## 2022-07-02 VITALS — BP 110/62 | Ht 61.0 in | Wt 113.0 lb

## 2022-07-02 DIAGNOSIS — M222X2 Patellofemoral disorders, left knee: Secondary | ICD-10-CM

## 2022-07-02 NOTE — Progress Notes (Signed)
  Kerri Robertson - 22 y.o. female MRN [id number]  Date of birth: [birth year]  SUBJECTIVE:  Including CC & ROS.  No chief complaint on file.   Kerri Robertson is a 23 y.o. female that is following up for her left knee pain.  She continues to do well with physical therapy and strengthening.  She wears a brace occasionally.    Review of Systems See HPI   HISTORY: Past Medical, Surgical, Social, and Family History Reviewed & Updated per EMR.   Pertinent Historical Findings include:  Past Medical History:  Diagnosis Date   Allergy    Cerebral palsy ([place])    CP (cerebral palsy) ([place])    Stroke [facility])     Past Surgical History:  Procedure Laterality Date   ARM SURGERY     2013   FOOT SURGERY     2010   HAMSTRING LENGTHENING     TIBIA / FIBIA LENGTHENING       PHYSICAL EXAM:  VS: BP 110/62   Ht 5' 1"$  (1.549 m)   Wt 113 lb (51.3 kg)   LMP 05/23/2022 (Exact Date)   BMI 21.35 kg/m  Physical Exam Gen: NAD, alert, cooperative with exam, well-appearing MSK:  Neurovascularly intact       ASSESSMENT & PLAN:   Patellofemoral pain syndrome of left knee Doing well with current modalities.  The pain has improved and she is only having to wear the brace intermittently.  Has received a pair of Custom orthotics and continues to focus on strengthening. -Counseled on home exercise therapy and supportive care. -Continue physical therapy. -could consider further imaging

## 2022-07-02 NOTE — Patient Instructions (Addendum)
Good to see you Please continue with physical  therapy  You can stop the brace when you feel strong and stable  You can slowly increase your walking as you tolerate   Please send me a message in MyChart with any questions or updates.  Please see me back as needed.   --Dr. Raeford Razor

## 2022-07-02 NOTE — Assessment & Plan Note (Signed)
Doing well with current modalities.  The pain has improved and she is only having to wear the brace intermittently.  Has received a pair of Custom orthotics and continues to focus on strengthening. -Counseled on home exercise therapy and supportive care. -Continue physical therapy. -could consider further imaging

## 2022-07-03 ENCOUNTER — Ambulatory Visit: Payer: BC Managed Care – PPO | Admitting: Physical Therapy

## 2022-07-03 ENCOUNTER — Encounter: Payer: Self-pay | Admitting: Physical Therapy

## 2022-07-03 DIAGNOSIS — M6281 Muscle weakness (generalized): Secondary | ICD-10-CM

## 2022-07-03 DIAGNOSIS — G800 Spastic quadriplegic cerebral palsy: Secondary | ICD-10-CM

## 2022-07-03 DIAGNOSIS — R278 Other lack of coordination: Secondary | ICD-10-CM

## 2022-07-03 DIAGNOSIS — R531 Weakness: Secondary | ICD-10-CM

## 2022-07-03 DIAGNOSIS — R262 Difficulty in walking, not elsewhere classified: Secondary | ICD-10-CM

## 2022-07-03 DIAGNOSIS — R2681 Unsteadiness on feet: Secondary | ICD-10-CM

## 2022-07-03 NOTE — Therapy (Signed)
OUTPATIENT PHYSICAL THERAPY TREATMENT   Patient Name: Kerri Robertson MRN: [id number] DOB:Nov 04, 2000, 22 y.o., female Today's Date: 04/10/2022   END OF SESSION:  PT End of Session - 07/03/22 1459     Visit Number 16    Date for PT Re-Evaluation 08/07/22    PT Start Time 1445    PT Stop Time [id number]    PT Time Calculation (min) 45 min    Activity Tolerance Patient tolerated treatment well    Behavior During Therapy [facility] for tasks assessed/performed                       Past Medical History:  Diagnosis Date   Allergy     Cerebral palsy ([place])     Stroke [facility])           Past Surgical History:  Procedure Laterality Date   ARM SURGERY        2013   FOOT SURGERY        2010   HAMSTRING LENGTHENING       TIBIA / FIBIA LENGTHENING            Patient Active Problem List    Diagnosis Date Noted   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   Balance problem 05/06/2018   Syncope 05/06/2018   History of cerebrovascular accident 11/18/2017   CP (cerebral palsy), spastic, 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[place]) 06/25/2017   Anxiety state 06/25/2017   Spasticity 06/25/2017   Allergic rhinitis 10/21/2016   Mild intermittent asthma 10/21/2016      PCP: Terrilyn Saver, NP   REFERRING PROVIDER: Terrilyn Saver, NP   REFERRING DIAG: G80.0 (ICD-10-CM) - CP (cerebral palsy), spastic, 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[place])    THERAPY DIAG:  Spastic quadriplegic cerebral palsy ([place])   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)   Other lack of coordination   Unsteadiness on feet   Difficulty in walking, not elsewhere classified   Left-sided weakness   Muscle weakness (generalized)   Abnormal posture   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 Rechecking CBC, iron, and vitamin D today. She has been on supplementation (refilling iron for her). Will make additional changes based on labs if indicated. PT referral placed to [facility] at her request. No acute concerns today.      PAIN:  Are you having pain? No   PRECAUTIONS: None   WEIGHT BEARING RESTRICTIONS: No   FALLS:  Has patient fallen in last 6 months? Yes. 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 Hobbies- Editor, commissioning, archery, baseball, tennis, dance    PLOF: Needs assistance with ADLs   PATIENT GOALS: Patient would like to improve her flexibility and balance.   NEXT MD VISIT:    OBJECTIVE:    DIAGNOSTIC FINDINGS:  Radiology consult confirms lumbar scoliosis with apex at L2-3, but since  patient is asymptomatic, recommend monitoring it and continue to strengthen.           COGNITION: Overall cognitive status: Within functional limits for tasks assessed                         SENSATION: Not tested     MUSCLE LENGTH: Hamstrings: Right 40 deg; Left 30 deg     POSTURE: left pelvic obliquity, weight shift right, and hips rotated to L with L LE held in IR, increased R WB     LOWER EXTREMITY ROM: Trunk lateraly flexed due to scoliosis, L hip unable to achieve neutral ER, abd limited, B hamstrings tight, L ankle does not achieve neutral DF or inversion. LUE with limited ROM at all joints, in all planes. she wears a splint on her wrist at night.      LOWER EXTREMITY MMT: RLE 4/5, LLE with fluctuating tone throughout, HS very weak,  hips also weak-MMT accuracy questionable due to her tone.   FUNCTIONAL TESTS:  5 times sit to stand: 14.77,  07/03/22 13 seconds Timed up and go (TUG): 13.75 , 07/03/22 = 10 seconds Berg Balance Scale: 48/56   GAIT: Distance walked: clinic distances Assistive device utilized: None Level of assistance: Modified independence Comments: Step-to pattern;Decreased step length - left;Decreased stance time - left;Decreased weight shift to left;Shuffle;Ataxic;Lateral hip instability;Trunk rotated posteriorly on left      TODAY'S TREATMENT:                                                                                                              DATE:  07/03/22 Nustep level 5 x 6 mintues Side stepping, then on airex balance beam Marches and hip abduction 2x10 each on the airex Supine hip abduction on sliding board Bridges, bridges with clamshells Hip extension red tband supine SAQ 2.5# Airex volleyball Airex marching  07/01/22 Nustep level 5 x 6 minutes Feet on ball K2C, trunk rotation, small bridge and isometric abs Sliding board and pillow case left hip abduction in supine Supine hip ER with some assist On airex volleyball On airex marching Hooklying working on left hip abduction yellow tband SAQ with 2.5# STM to the upper traps and neck  06/26/22 NuStep L5 x38mns  Leg press 20# 2x10 Step ups on airex- discontinued  Box taps 4" minA CGA 2x10  Side steps with band along mat table red  BFR protocol SAQ 30-15-15-15   06/17/22 Nustep level 5 x 6 minutes Standing volleyball Airex head turns, eyes closed, cone toe touches BFR protocol SAQ 30-15-15-15 STM to the bilateral upper traps and the neck area   06/12/22 NuStep L5 x650ms  Seated hip abduction green 2x10 Marching 2# 2x10 Step ups 4"  Passive stretching HS, SKTC, hip ER/IR 30s  SLR 2# 2x10 LLE Sidelying abduction LLE 2# 2x10 Fitter pushes 2x10 Volleyball hits Volleyball kicks x5 each side    06/10/22 NuStep L5  x6m47m  Stretching HS 30s x2 each side  Supine clamshells green 2x10 LAQ  3# 2x10 HS curls red 2x10  STM to upper traps, rhomboid, neck followed by passive stretching  06/05/22 Reassess TUG, 5xSTS, and BERG NuStep L4 x33mns Stretching HS 30s each side BF restriction therapy to L quad 30-15-15-15 with short arc quad HS curls red 2x10  06/03/22 Nustep level 5 x 6 mintues Volleyball on and off airex Discussion of POC and possible causes of the knee and HA pains as well as what we can do to help with patient and her mom, reviewed use of brace with caregiver and patient STM to the upper traps, rhomboid and neck mms  05/28/22 Standing hip abduction yellow TB around knees 2x10 Standing Rt step up/hold with forward lunge to encouraged Lt hip extension x15 Supine Lt quad set with hip alignment correction 10x3 sec hold  Manual: passive stretch Lt hamstring with contract/relax, Lt quadriceps in prone, Lt butterfly and Lt adductor stretch, Lt patellar mobs all directions x2 bouts  Self-care: brace wear, any limitations with return to volunteering  PATIENT EDUCATION:  Education details: impact spasticity and inflexibility has on gait and patella tracking during ambulation; encouraged increase in stretch; consider botox or other appropriate intervention with MD regarding spasticity (if needed); updates to HEP Person educated: Patient and Caregiver   Education method: Explanation Education comprehension: verbalized understanding   HOME EXERCISE PROGRAM: [id number]   ASSESSMENT:   CLINICAL IMPRESSION: Patient is doing well, We have worked on balance strength and the neck pain and spasms, she is overall improving, did have a fall 2 weeks ago.  Her TUG score improved, her 5XSTS improved and her Berg balance score improved.  She is having less knee pain and less neck pain, she is still very tight in the neck and some into the upper traps OBJECTIVE IMPAIRMENTS: Abnormal gait, decreased balance,  decreased coordination, decreased endurance, decreased mobility, difficulty walking, decreased ROM, decreased strength, increased fascial restrictions, increased muscle spasms, impaired flexibility, impaired tone, improper body mechanics, and postural dysfunction.    ACTIVITY LIMITATIONS: standing, squatting, stairs, transfers, and locomotion level   PARTICIPATION LIMITATIONS: shopping, community activity, and occupation   PERSONAL FACTORS: 1 comorbidity: CP  are also affecting patient's functional outcome.    REHAB POTENTIAL: Good   CLINICAL DECISION MAKING: Stable/uncomplicated   EVALUATION COMPLEXITY: Moderate     GOALS: Goals reviewed with patient? Yes   SHORT TERM GOALS: Target date: 05/01/22 I with initial HEP Baseline: Goal status: ongoing   LONG TERM GOALS: Target date: 06/19/22   I with final HEP Baseline:  Goal status: ongoing   2.  Improve TUG time to < 12 sec Baseline:  Goal status: ongoing 12.5s 07/03/22  3.  Improve 5 x STS to <12 Baseline:  Goal status:MET 06/05/22   4.  Increase BERG score to at least 44 Baseline:  Goal status: 45/56 MET   5.  Patient will ambulate with normalized gait pattern, minimizing abnormal weight shifts and increasing LLE position to more neutral hip rotation. Baseline:  Goal status: ongoing     PLAN:   PT FREQUENCY: 1-2x/week   PT DURATION: 10 weeks   PLANNED INTERVENTIONS: Therapeutic exercises, Therapeutic activity, Neuromuscular re-education, Balance training, Gait training, Patient/Family education, Self Care, Joint mobilization, Dry Needling, Cryotherapy, Moist heat, Ionotophoresis '4mg'$ /ml Dexamethasone, and Manual therapy   PLAN FOR NEXT SESSION: Will continue over the next period to maximize her function and safety and, decrease her pain 3:00 PM,07/03/22 MLum Babe PT

## 2022-07-09 NOTE — Telephone Encounter (Signed)
Please let patient know there was nothing new on MRIs. I compared to old reports from 2021 and the MRIs are stable. I have been trying to get the images on a disk to compare the images but we have reacjed out 4 times. The only thing that family could do is please go get a copy of the MRI from 2021 on CD and drop off at our office if they want [place] to compare images but the reports show no changes thans

## 2022-07-09 NOTE — Telephone Encounter (Signed)
I received this email from [place] [facility]: We have been trying to get the images for this patient to do a comparison and addendum from [facility]. Can you please contact them to get the images powershare to [facility]. No response with 4 attempts. Let me know if you have any questions.

## 2022-07-09 NOTE — Telephone Encounter (Signed)
Spoke to mother of pt.  I relayed that per Dr. Jaynee Eagles that there was nothing new on MRIs. I compared to old reports from 2021 and the MRIs are stable. She had been trying to get the images on a disk to compare the images but we have reached out 4 times. The only thing that they could do is please go get a copy of the MRI from 2021 on CD and drop off at our office if they want [place] to compare images but the reports show no changes.  She verbalized understanding.  Would see if they had at home otherwise would reach out to facility and see about getting one.  [facility] is whom we have tried to reach to.

## 2022-07-09 NOTE — Telephone Encounter (Signed)
LMVM for pt to return call.   

## 2022-07-13 ENCOUNTER — Encounter: Payer: Self-pay | Admitting: Physical Therapy

## 2022-07-14 NOTE — Therapy (Signed)
OUTPATIENT PHYSICAL THERAPY TREATMENT   Patient Name: Kerri Robertson MRN: [id number] DOB:[birth year], 22 y.o., female Today's Date: 04/10/2022   END OF SESSION:  PT End of Session - 07/03/22 1459     Visit Number 16    Date for PT Re-Evaluation 08/07/22    PT Start Time 1445    PT Stop Time [id number]    PT Time Calculation (min) 45 min    Activity Tolerance Patient tolerated treatment well    Behavior During Therapy [facility] for tasks assessed/performed                       Past Medical History:  Diagnosis Date   Allergy     Cerebral palsy ([place])     Stroke [facility])           Past Surgical History:  Procedure Laterality Date   ARM SURGERY        2013   FOOT SURGERY        2010   HAMSTRING LENGTHENING       TIBIA / FIBIA LENGTHENING            Patient Active Problem List    Diagnosis Date Noted   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   Balance problem 05/06/2018   Syncope 05/06/2018   History of cerebrovascular accident 11/18/2017   CP (cerebral palsy), spastic, 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[place]) 06/25/2017   Anxiety state 06/25/2017   Spasticity 06/25/2017   Allergic rhinitis 10/21/2016   Mild intermittent asthma 10/21/2016      PCP: Terrilyn Saver, NP   REFERRING PROVIDER: Terrilyn Saver, NP   REFERRING DIAG: G80.0 (ICD-10-CM) - CP (cerebral palsy), spastic, 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 Has been about a week.   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 Rechecking CBC, iron, and vitamin D today. She has been on supplementation (refilling iron for her). Will make additional changes based on labs if indicated. PT referral placed to [facility] at her request. No acute concerns today.      PAIN:  Are you having pain? No   PRECAUTIONS: None   WEIGHT BEARING RESTRICTIONS: No   FALLS:  Has patient fallen in last 6 months? Yes. 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 Hobbies- Editor, commissioning, archery, baseball, tennis, dance    PLOF: Needs assistance with ADLs   PATIENT GOALS: Patient would like to improve her flexibility and balance.   NEXT MD VISIT:    OBJECTIVE:    DIAGNOSTIC FINDINGS:  Radiology consult confirms  lumbar scoliosis with apex at L2-3, but since patient is asymptomatic, recommend monitoring it and continue to strengthen.           COGNITION: Overall cognitive status: Within functional limits for tasks assessed                         SENSATION: Not tested     MUSCLE LENGTH: Hamstrings: Right 40 deg; Left 30 deg     POSTURE: left pelvic obliquity, weight shift right, and hips rotated to L with L LE held in IR, increased R WB     LOWER EXTREMITY ROM: Trunk lateraly flexed due to scoliosis, L hip unable to achieve neutral ER, abd limited, B hamstrings tight, L ankle does not achieve neutral DF or inversion. LUE with limited ROM at all joints, in all planes. she wears a splint on her wrist at night.      LOWER EXTREMITY MMT: RLE 4/5, LLE with  fluctuating tone throughout, HS very weak, hips also weak-MMT accuracy questionable due to her tone.   FUNCTIONAL TESTS:  5 times sit to stand: 14.77,  07/03/22 13 seconds Timed up and go (TUG): 13.75 , 07/03/22 = 10 seconds Berg Balance Scale: 48/56   GAIT: Distance walked: clinic distances Assistive device utilized: None Level of assistance: Modified independence Comments: Step-to pattern;Decreased step length - left;Decreased stance time - left;Decreased weight shift to left;Shuffle;Ataxic;Lateral hip instability;Trunk rotated posteriorly on left      TODAY'S TREATMENT:                                                                                                              DATE:  07/15/22 Feet on ball knees to chest x10 SAQ 3# 2x10 LLE  Fitter pushes LLE 2x10  Standing on airex marching 2x10 in bars Hip abd on airex 2x10 in bars  Calf raises on airex 2x10 in bars NuStep LE only L5 x25mns  STM to neck and passive stretching    07/03/22 Nustep level 5 x 6 mintues Side stepping, then on airex balance beam Marches and hip abduction 2x10 each on the airex Supine hip abduction on sliding board Bridges, bridges with clamshells Hip extension red tband supine SAQ 2.5# Airex volleyball Airex marching  07/01/22 Nustep level 5 x 6 minutes Feet on ball K2C, trunk rotation, small bridge and isometric abs Sliding board and pillow case left hip abduction in supine Supine hip ER with some assist On airex volleyball On airex marching Hooklying working on left hip abduction yellow tband SAQ with 2.5# STM to the upper traps and neck  06/26/22 NuStep L5 x678ms  Leg press 20# 2x10 Step ups on airex- discontinued  Box taps 4" minA CGA 2x10  Side steps with band along mat table red  BFR protocol SAQ 30-15-15-15   06/17/22 Nustep level 5 x 6 minutes Standing volleyball Airex head turns, eyes closed, cone toe touches BFR protocol SAQ 30-15-15-15 STM to the bilateral upper traps  and the neck area   06/12/22  NuStep L5 x77mns  Seated hip abduction green 2x10 Marching 2# 2x10 Step ups 4"  Passive stretching HS, SKTC, hip ER/IR 30s  SLR 2# 2x10 LLE Sidelying abduction LLE 2# 2x10 Fitter pushes 2x10 Volleyball hits Volleyball kicks x5 each side    06/10/22 NuStep L5 x681ms  Stretching HS 30s x2 each side  Supine clamshells green 2x10 LAQ 3# 2x10 HS curls red 2x10  STM to upper traps, rhomboid, neck followed by passive stretching  06/05/22 Reassess TUG, 5xSTS, and BERG NuStep L4 x6m67m Stretching HS 30s each side BF restriction therapy to L quad 30-15-15-15 with short arc quad HS curls red 2x10  06/03/22 Nustep level 5 x 6 mintues Volleyball on and off airex Discussion of POC and possible causes of the knee and HA pains as well as what we can do to help with patient and her mom, reviewed use of brace with caregiver and patient STM to the upper traps, rhomboid and neck mms  05/28/22 Standing hip abduction yellow TB around knees 2x10 Standing Rt step up/hold with forward lunge to encouraged Lt hip extension x15 Supine Lt quad set with hip alignment correction 10x3 sec hold  Manual: passive stretch Lt hamstring with contract/relax, Lt quadriceps in prone, Lt butterfly and Lt adductor stretch, Lt patellar mobs all directions x2 bouts  Self-care: brace wear, any limitations with return to volunteering  PATIENT EDUCATION:  Education details: impact spasticity and inflexibility has on gait and patella tracking during ambulation; encouraged increase in stretch; consider botox or other appropriate intervention with MD regarding spasticity (if needed); updates to HEP Person educated: Patient and Caregiver   Education method: Explanation Education comprehension: verbalized understanding   HOME EXERCISE PROGRAM: [id number]:   CLINICAL IMPRESSION: Patient is doing well, We continue to work on balance and strength. She reports she  is having less knee  pain and less neck pain. Had some difficulty with calf raises, unable to get much height just slight movement. Continue to progress as tolerated.   OBJECTIVE IMPAIRMENTS: Abnormal gait, decreased balance, decreased coordination, decreased endurance, decreased mobility, difficulty walking, decreased ROM, decreased strength, increased fascial restrictions, increased muscle spasms, impaired flexibility, impaired tone, improper body mechanics, and postural dysfunction.    ACTIVITY LIMITATIONS: standing, squatting, stairs, transfers, and locomotion level   PARTICIPATION LIMITATIONS: shopping, community activity, and occupation   PERSONAL FACTORS: 1 comorbidity: CP  are also affecting patient's functional outcome.    REHAB POTENTIAL: Good   CLINICAL DECISION MAKING: Stable/uncomplicated   EVALUATION COMPLEXITY: Moderate     GOALS: Goals reviewed with patient? Yes   SHORT TERM GOALS: Target date: 05/01/22 I with initial HEP Baseline: Goal status: MET   LONG TERM GOALS: Target date: 4/424   I with final HEP Baseline:  Goal status: ongoing   2.  Improve TUG time to < 12 sec Baseline:  Goal status: ongoing 12.5s 07/03/22  3.  Improve 5 x STS to <12 Baseline:  Goal status:MET 06/05/22   4.  Increase BERG score to at least 44 Baseline:  Goal status: 45/56 MET   5.  Patient will ambulate with normalized gait pattern, minimizing abnormal weight shifts and increasing LLE position to more neutral hip rotation. Baseline:  Goal status: ongoing     PLAN:   PT FREQUENCY: 1-2x/week   PT DURATION: 10 weeks   PLANNED INTERVENTIONS: Therapeutic exercises, Therapeutic activity, Neuromuscular re-education, Balance training, Gait training, Patient/Family education, Self Care, Joint mobilization, Dry Needling, Cryotherapy, Moist heat, Ionotophoresis '4mg'$ /ml Dexamethasone, and  Manual therapy   PLAN FOR NEXT SESSION: Will continue over the next period to maximize her function and safety and,  decrease her pain 3:00 PM,07/03/22 Lum Babe, PT

## 2022-07-15 ENCOUNTER — Ambulatory Visit: Payer: BC Managed Care – PPO | Attending: Family Medicine

## 2022-07-15 DIAGNOSIS — M6281 Muscle weakness (generalized): Secondary | ICD-10-CM | POA: Diagnosis present

## 2022-07-15 DIAGNOSIS — G800 Spastic quadriplegic cerebral palsy: Secondary | ICD-10-CM | POA: Insufficient documentation

## 2022-07-15 DIAGNOSIS — I69954 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: Secondary | ICD-10-CM | POA: Diagnosis present

## 2022-07-15 DIAGNOSIS — R262 Difficulty in walking, not elsewhere classified: Secondary | ICD-10-CM | POA: Insufficient documentation

## 2022-07-15 DIAGNOSIS — R278 Other lack of coordination: Secondary | ICD-10-CM | POA: Diagnosis present

## 2022-07-15 DIAGNOSIS — G801 Spastic diplegic cerebral palsy: Secondary | ICD-10-CM | POA: Diagnosis present

## 2022-07-15 DIAGNOSIS — R2681 Unsteadiness on feet: Secondary | ICD-10-CM | POA: Insufficient documentation

## 2022-07-17 ENCOUNTER — Ambulatory Visit: Payer: BC Managed Care – PPO | Admitting: Physical Therapy

## 2022-07-17 ENCOUNTER — Encounter: Payer: Self-pay | Admitting: Physical Therapy

## 2022-07-17 DIAGNOSIS — G800 Spastic quadriplegic cerebral palsy: Secondary | ICD-10-CM

## 2022-07-17 DIAGNOSIS — R262 Difficulty in walking, not elsewhere classified: Secondary | ICD-10-CM

## 2022-07-17 DIAGNOSIS — M6281 Muscle weakness (generalized): Secondary | ICD-10-CM

## 2022-07-17 DIAGNOSIS — R2681 Unsteadiness on feet: Secondary | ICD-10-CM

## 2022-07-17 NOTE — Therapy (Signed)
OUTPATIENT PHYSICAL THERAPY TREATMENT   Patient Name: Kerri Robertson MRN: [id number] DOB:[birth year], 22 y.o., female Today's Date: 04/10/2022   END OF SESSION:  PT End of Session - 07/17/22 1645     Visit Number 18    Date for PT Re-Evaluation 08/07/22    PT Start Time 1610    PT Stop Time 1655    PT Time Calculation (min) 45 min    Activity Tolerance Patient tolerated treatment well    Behavior During Therapy WFL for tasks assessed/performed                       Past Medical History:  Diagnosis Date   Allergy     Cerebral palsy ([place])     Stroke ([place])           Past Surgical History:  Procedure Laterality Date   ARM SURGERY        2013   FOOT SURGERY        2010   HAMSTRING LENGTHENING       TIBIA / FIBIA LENGTHENING            Patient Active Problem List    Diagnosis Date Noted   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   Balance problem 05/06/2018   Syncope 05/06/2018   History of cerebrovascular accident 11/18/2017   CP (cerebral palsy), spastic, 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[place]) 06/25/2017   Anxiety state 06/25/2017   Spasticity 06/25/2017   Allergic rhinitis 10/21/2016   Mild intermittent asthma 10/21/2016      PCP: Terrilyn Saver, NP   REFERRING PROVIDER: Terrilyn Saver, NP   REFERRING DIAG: G80.0 (ICD-10-CM) - CP (cerebral palsy), spastic, 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[place])    THERAPY DIAG:  Spastic quadriplegic cerebral palsy ([place])   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 Rechecking CBC, iron, and vitamin D today. She has been on supplementation (refilling iron for her). Will make additional changes based on labs if indicated. PT referral placed to [facility] at her request. No acute concerns today.      PAIN:  Are you having pain? No   PRECAUTIONS: None   WEIGHT BEARING RESTRICTIONS: No   FALLS:  Has patient fallen in last 6 months? Yes. 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 Hobbies- Editor, commissioning, archery, baseball, tennis, dance    PLOF: Needs assistance with ADLs   PATIENT GOALS: Patient would like to improve her flexibility and balance.   NEXT MD VISIT:    OBJECTIVE:    DIAGNOSTIC FINDINGS:  Radiology consult confirms lumbar scoliosis  with apex at L2-3, but since patient is asymptomatic, recommend monitoring it and continue to strengthen.           COGNITION: Overall cognitive status: Within functional limits for tasks assessed                         SENSATION: Not tested     MUSCLE LENGTH: Hamstrings: Right 40 deg; Left 30 deg     POSTURE: left pelvic obliquity, weight shift right, and hips rotated to L with L LE held in IR, increased R WB     LOWER EXTREMITY ROM: Trunk lateraly flexed due to scoliosis, L hip unable to achieve neutral ER, abd limited, B hamstrings tight, L ankle does not achieve neutral DF or inversion. LUE with limited ROM at all joints, in all planes. she wears a splint on her wrist at night.      LOWER EXTREMITY MMT: RLE 4/5, LLE with fluctuating tone  throughout, HS very weak, hips also weak-MMT accuracy questionable due to her tone.   FUNCTIONAL TESTS:  5 times sit to stand: 14.77,  07/03/22 13 seconds Timed up and go (TUG): 13.75 , 07/03/22 = 10 seconds Berg Balance Scale: 48/56   GAIT: Distance walked: clinic distances Assistive device utilized: None Level of assistance: Modified independence Comments: Step-to pattern;Decreased step length - left;Decreased stance time - left;Decreased weight shift to left;Shuffle;Ataxic;Lateral hip instability;Trunk rotated posteriorly on left      TODAY'S TREATMENT:                                                                                                              DATE:  07/17/22 Nustep level 5 x 6 minutes Supine PROM left hip ER and adductors Sliding board abduction Supine active hip ER Hooklying clamshells Tried side lying hip abduction and clamshell but very difficult SAQ  07/15/22 Feet on ball knees to chest x10 SAQ 3# 2x10 LLE  Fitter pushes LLE 2x10  Standing on airex marching 2x10 in bars Hip abd on airex 2x10 in bars  Calf raises on airex 2x10 in bars NuStep LE only L5 x24mns  STM to neck and passive stretching    07/03/22 Nustep level 5 x 6 mintues Side stepping, then on airex balance beam Marches and hip abduction 2x10 each on the airex Supine hip abduction on sliding board Bridges, bridges with clamshells Hip extension red tband supine SAQ 2.5# Airex volleyball Airex marching  07/01/22 Nustep level 5 x 6 minutes Feet on ball K2C, trunk rotation, small bridge and isometric abs Sliding board and pillow case left hip abduction in supine Supine hip ER with some assist On airex volleyball On airex marching Hooklying working on left hip abduction yellow tband SAQ with 2.5# STM to the upper traps and neck  06/26/22 NuStep L5 x666ms  Leg press 20# 2x10 Step ups on airex- discontinued  Box taps 4" minA CGA 2x10  Side steps with band along mat table red  BFR  protocol SAQ 30-15-15-15   PATIENT  EDUCATION:  Education details: impact spasticity and inflexibility has on gait and patella tracking during ambulation; encouraged increase in stretch; consider botox or other appropriate intervention with MD regarding spasticity (if needed); updates to HEP Person educated: Patient and Caregiver   Education method: Explanation Education comprehension: verbalized understanding   HOME EXERCISE PROGRAM: [place]  Access Code: [email or web address] URL: [email or web address] Date: 07/17/2022 Prepared by: Lum Babe  Exercises - Supine Hip Internal and External Rotation  - 1 x daily - 7 x weekly - 2 sets - 10 reps - 3 hold - Supine Hip Abduction on Slider  - 1 x daily - 7 x weekly - 2 sets - 10 reps - 3 hold - Hooklying Clamshell with Resistance  - 1 x daily - 7 x weekly - 2 sets - 10 reps - 3 hold - Hip Abduction and Adduction Caregiver PROM  - 1 x daily - 7 x weekly - 1 sets - 5 reps - 30-60 hold - Caregiver PROM Hip Internal External Rotation  - 1 x daily - 7 x weekly - 1 sets - 5 reps - 30-60 hold - Supine Short Arc Quad  - 1 x daily - 7 x weekly - 2 sets - 10 reps - 3 hold ASSESSMENT:   CLINICAL IMPRESSION: Tried to address mom's concerns about the hip and knee with walking, she internally rotates the hip and it adducts in, I performed passive stretch and instructed her caregiver Lexine Baton in how to do this safely, we also did some active exercises which I gave as an HEP and educated [place] on.  I cautioned them for no pain just a stretch, I also told them of concerns of too much stretch as Emony's spasticity at times could be how she is able to locomote and we do not want to over do it just have better hip and knee alignment with her gait  OBJECTIVE IMPAIRMENTS: Abnormal gait, decreased balance, decreased coordination, decreased endurance, decreased mobility, difficulty walking, decreased ROM, decreased strength, increased fascial  restrictions, increased muscle spasms, impaired flexibility, impaired tone, improper body mechanics, and postural dysfunction.    ACTIVITY LIMITATIONS: standing, squatting, stairs, transfers, and locomotion level   PARTICIPATION LIMITATIONS: shopping, community activity, and occupation   PERSONAL FACTORS: 1 comorbidity: CP  are also affecting patient's functional outcome.    REHAB POTENTIAL: Good   CLINICAL DECISION MAKING: Stable/uncomplicated   EVALUATION COMPLEXITY: Moderate     GOALS: Goals reviewed with patient? Yes   SHORT TERM GOALS: Target date: 05/01/22 I with initial HEP Baseline: Goal status: MET   LONG TERM GOALS: Target date: 4/424   I with final HEP Baseline:  Goal status: ongoing   2.  Improve TUG time to < 12 sec Baseline:  Goal status: ongoing 12.5s 07/03/22  3.  Improve 5 x STS to <12 Baseline:  Goal status:MET 06/05/22   4.  Increase BERG score to at least 44 Baseline:  Goal status: 45/56 MET   5.  Patient will ambulate with normalized gait pattern, minimizing abnormal weight shifts and increasing LLE position to more neutral hip rotation. Baseline:  Goal status: ongoing     PLAN:   PT FREQUENCY: 1-2x/week   PT DURATION: 10 weeks   PLANNED INTERVENTIONS: Therapeutic exercises, Therapeutic activity, Neuromuscular re-education, Balance training, Gait training, Patient/Family education, Self Care, Joint mobilization, Dry Needling, Cryotherapy, Moist heat, Ionotophoresis '4mg'$ /ml Dexamethasone, and Manual therapy   PLAN FOR NEXT SESSION: work on hip ER and abduction as well as quad strength  4:45 PM,07/17/22 Lum Babe, PT

## 2022-07-22 ENCOUNTER — Ambulatory Visit: Payer: BC Managed Care – PPO | Admitting: Physical Therapy

## 2022-07-22 ENCOUNTER — Encounter: Payer: Self-pay | Admitting: Physical Therapy

## 2022-07-22 DIAGNOSIS — R262 Difficulty in walking, not elsewhere classified: Secondary | ICD-10-CM

## 2022-07-22 DIAGNOSIS — M6281 Muscle weakness (generalized): Secondary | ICD-10-CM

## 2022-07-22 DIAGNOSIS — G800 Spastic quadriplegic cerebral palsy: Secondary | ICD-10-CM

## 2022-07-22 DIAGNOSIS — R2681 Unsteadiness on feet: Secondary | ICD-10-CM

## 2022-07-22 NOTE — Therapy (Signed)
OUTPATIENT PHYSICAL THERAPY TREATMENT   Patient Name: Kerri Robertson MRN: [id number] DOB:Dec 02, 2000, 22 y.o., female Today's Date: 04/10/2022   END OF SESSION:  PT End of Session - 07/22/22 1655     Visit Number 19    Date for PT Re-Evaluation 08/07/22    PT Start Time 1655    PT Stop Time [id number]    PT Time Calculation (min) 48 min    Activity Tolerance Patient tolerated treatment well    Behavior During Therapy WFL for tasks assessed/performed                       Past Medical History:  Diagnosis Date   Allergy     Cerebral palsy ([place])     Stroke [facility])           Past Surgical History:  Procedure Laterality Date   ARM SURGERY        2013   FOOT SURGERY        2010   HAMSTRING LENGTHENING       TIBIA / FIBIA LENGTHENING            Patient Active Problem List    Diagnosis Date Noted   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   Balance problem 05/06/2018   Syncope 05/06/2018   History of cerebrovascular accident 11/18/2017   CP (cerebral palsy), spastic, 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[place]) 06/25/2017   Anxiety state 06/25/2017   Spasticity 06/25/2017   Allergic rhinitis 10/21/2016   Mild intermittent asthma 10/21/2016      PCP: Terrilyn Saver, NP   REFERRING PROVIDER: Terrilyn Saver, NP   REFERRING DIAG: G80.0 (ICD-10-CM) - CP (cerebral palsy), spastic, 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[place])    THERAPY DIAG:  Spastic quadriplegic cerebral palsy ([place])   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)   Other lack of coordination   Unsteadiness on feet   Difficulty in walking, not elsewhere classified   Left-sided weakness   Muscle weakness (generalized)   Abnormal posture   Cerebral palsy with spastic diplegia ([place])   Rationale for Evaluation and Treatment: Rehabilitation   ONSET DATE: 03/17/22   SUBJECTIVE:    SUBJECTIVE STATEMENT: Reports that they tried  the HEP, doing well, still with the left hip in IR and adduction, a little sore, she reports that she is very tired today  PERTINENT HISTORY:     CP (cerebral palsy), spastic, 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 Rechecking CBC, iron, and vitamin D today. She has been on supplementation (refilling iron for her). Will make additional changes based on labs if indicated. PT referral placed to [facility] at her request. No acute concerns today.      PAIN:  Are you having pain? No   PRECAUTIONS: None   WEIGHT BEARING RESTRICTIONS: No   FALLS:  Has patient fallen in last 6 months? Yes. 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 Hobbies- Editor, commissioning, archery, baseball, tennis, dance    PLOF: Needs assistance with ADLs   PATIENT GOALS: Patient would like to improve her flexibility and balance.   NEXT MD VISIT:  OBJECTIVE:    DIAGNOSTIC FINDINGS:  Radiology consult confirms lumbar scoliosis with apex at L2-3, but since patient is asymptomatic, recommend monitoring it and continue to strengthen.           COGNITION: Overall cognitive status: Within functional limits for tasks assessed                         SENSATION: Not tested     MUSCLE LENGTH: Hamstrings: Right 40 deg; Left 30 deg     POSTURE: left pelvic obliquity, weight shift right, and hips rotated to L with L LE held in IR, increased R WB     LOWER EXTREMITY ROM: Trunk lateraly flexed due to scoliosis, L hip unable to achieve neutral ER, abd limited, B hamstrings tight, L ankle does not achieve neutral DF or inversion. LUE with limited ROM at all joints, in all planes. she wears a splint on her wrist at night.       LOWER EXTREMITY MMT: RLE 4/5, LLE with fluctuating tone throughout, HS very weak, hips also weak-MMT accuracy questionable due to her tone.   FUNCTIONAL TESTS:  5 times sit to stand: 14.77,  07/03/22 13 seconds Timed up and go (TUG): 13.75 , 07/03/22 = 10 seconds Berg Balance Scale: 48/56   GAIT: Distance walked: clinic distances Assistive device utilized: None Level of assistance: Modified independence Comments: Step-to pattern;Decreased step length - left;Decreased stance time - left;Decreased weight shift to left;Shuffle;Ataxic;Lateral hip instability;Trunk rotated posteriorly on left      TODAY'S TREATMENT:                                                                                                              DATE:  07/22/22 Nustep level 5 x 6 minutes LEg press with ball b/n thighs for proper alignment x 10 with 20#, then left only no weight with manually aligning the left knee no weight 2x7 Supine ball b/n knees bridges Hooklying yellow tband left hip abduction Worked on left hip ER actively Left hip abduction with sliding board PROM/stretches of the HS, piriformis, hip ER, adductors Single leg left leg bridges Prone hip extension needed mod A for this Education with caregiver on the stretches,   07/17/22 Nustep level 5 x 6 minutes Supine PROM left hip ER and adductors Sliding board abduction Supine active hip ER Hooklying clamshells Tried side lying hip abduction and clamshell but very difficult SAQ  07/15/22 Feet on ball knees to chest x10 SAQ 3# 2x10 LLE  Fitter pushes LLE 2x10  Standing on airex marching 2x10 in bars Hip abd on airex 2x10 in bars  Calf raises on airex 2x10 in bars NuStep LE only L5 x59mins  STM to neck and passive stretching    07/03/22 Nustep level 5 x 6 mintues Side stepping, then on airex balance beam Marches and hip abduction 2x10 each on the airex Supine hip abduction on sliding board Bridges, bridges with clamshells Hip extension  red tband supine SAQ 2.5# Airex volleyball Airex marching  07/01/22 Nustep level 5 x 6 minutes Feet on ball K2C, trunk rotation, small bridge and isometric abs Sliding board and pillow case left hip abduction in supine Supine hip ER with some assist On airex volleyball On airex marching Hooklying working on left hip abduction yellow tband SAQ with 2.5# STM to the upper traps and neck  06/26/22 NuStep L5 x21mins  Leg press 20# 2x10 Step ups on airex- discontinued  Box taps 4" minA CGA 2x10  Side steps with band along mat table red  BFR protocol SAQ 30-15-15-15   PATIENT EDUCATION:  Education details: impact spasticity and inflexibility has on gait and patella tracking during ambulation; encouraged increase in stretch; consider botox or other appropriate intervention with MD regarding spasticity (if needed); updates to HEP Person educated: Patient and Caregiver   Education method: Explanation Education comprehension: verbalized understanding   HOME EXERCISE PROGRAM: [place]  Access Code: [email or web address] URL: [email or web address] Date: 07/17/2022 Prepared by: Lum Babe  Exercises - Supine Hip Internal and External Rotation  - 1 x daily - 7 x weekly - 2 sets - 10 reps - 3 hold - Supine Hip Abduction on Slider  - 1 x daily - 7 x weekly - 2 sets - 10 reps - 3 hold - Hooklying Clamshell with Resistance  - 1 x daily - 7 x weekly - 2 sets - 10 reps - 3 hold - Hip Abduction and Adduction Caregiver PROM  - 1 x daily - 7 x weekly - 1 sets - 5 reps - 30-60 hold - Caregiver PROM Hip Internal External Rotation  - 1 x daily - 7 x weekly - 1 sets - 5 reps - 30-60 hold - Supine Short Arc Quad  - 1 x daily - 7 x weekly - 2 sets - 10 reps - 3 hold ASSESSMENT:   CLINICAL IMPRESSION: Patient did much better today with the hip abduction, her and the caregiver have been doing this and this is much improved, still difficulty with hooklying yellow tband left hip abduction.  Did  more ER, added some hip flexor and quad stretches as well as piriformis stretch.  Went over this with caregiver, I am hesitant to do a lot of stretching as sometimes the spasticity helps with locomotion but I do understand the concer of the adduction and hip IR causing possible problems in the future.  OBJECTIVE IMPAIRMENTS: Abnormal gait, decreased balance, decreased coordination, decreased endurance, decreased mobility, difficulty walking, decreased ROM, decreased strength, increased fascial restrictions, increased muscle spasms, impaired flexibility, impaired tone, improper body mechanics, and postural dysfunction.    ACTIVITY LIMITATIONS: standing, squatting, stairs, transfers, and locomotion level   PARTICIPATION LIMITATIONS: shopping, community activity, and occupation   PERSONAL FACTORS: 1 comorbidity: CP  are also affecting patient's functional outcome.    REHAB POTENTIAL: Good   CLINICAL DECISION MAKING: Stable/uncomplicated   EVALUATION COMPLEXITY: Moderate     GOALS: Goals reviewed with patient? Yes   SHORT TERM GOALS: Target date: 05/01/22 I with initial HEP Baseline: Goal status: MET   LONG TERM GOALS: Target date: 4/424   I with final HEP Baseline:  Goal status: ongoing   2.  Improve TUG time to < 12 sec Baseline:  Goal status: ongoing 12.5s 07/03/22  3.  Improve 5 x STS to <12 Baseline:  Goal status:MET 06/05/22   4.  Increase BERG score to at least 44 Baseline:  Goal status: 45/56 MET   5.  Patient will ambulate with normalized gait pattern, minimizing abnormal weight shifts  and increasing LLE position to more neutral hip rotation. Baseline:  Goal status: ongoing     PLAN:   PT FREQUENCY: 1-2x/week   PT DURATION: 10 weeks   PLANNED INTERVENTIONS: Therapeutic exercises, Therapeutic activity, Neuromuscular re-education, Balance training, Gait training, Patient/Family education, Self Care, Joint mobilization, Dry Needling, Cryotherapy, Moist heat,  Ionotophoresis 4mg /ml Dexamethasone, and Manual therapy   PLAN FOR NEXT SESSION: work on hip ER and abduction as well as quad strength 4:55 PM,07/22/22 Lum Babe, PT

## 2022-07-24 ENCOUNTER — Ambulatory Visit: Payer: BC Managed Care – PPO | Admitting: Physical Therapy

## 2022-07-24 ENCOUNTER — Encounter: Payer: Self-pay | Admitting: Physical Therapy

## 2022-07-24 DIAGNOSIS — R262 Difficulty in walking, not elsewhere classified: Secondary | ICD-10-CM

## 2022-07-24 DIAGNOSIS — M6281 Muscle weakness (generalized): Secondary | ICD-10-CM

## 2022-07-24 DIAGNOSIS — R2681 Unsteadiness on feet: Secondary | ICD-10-CM

## 2022-07-24 DIAGNOSIS — G801 Spastic diplegic cerebral palsy: Secondary | ICD-10-CM

## 2022-07-24 DIAGNOSIS — I69954 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: Secondary | ICD-10-CM

## 2022-07-24 DIAGNOSIS — G800 Spastic quadriplegic cerebral palsy: Secondary | ICD-10-CM | POA: Diagnosis not present

## 2022-07-24 NOTE — Therapy (Signed)
OUTPATIENT PHYSICAL THERAPY TREATMENT   Patient Name: Kerri Robertson MRN: [id number] DOB:10-23-2000, 22 y.o., female Today's Date: 04/10/2022   END OF SESSION:  PT End of Session - 07/24/22 1700     Visit Number 20    Date for PT Re-Evaluation 08/07/22    PT Start Time 1657    PT Stop Time [id number]    PT Time Calculation (min) 43 min    Activity Tolerance Patient tolerated treatment well    Behavior During Therapy WFL for tasks assessed/performed                       Past Medical History:  Diagnosis Date   Allergy     Cerebral palsy ([place])     Stroke ([place])           Past Surgical History:  Procedure Laterality Date   ARM SURGERY        2013   FOOT SURGERY        2010   HAMSTRING LENGTHENING       TIBIA / FIBIA LENGTHENING            Patient Active Problem List    Diagnosis Date Noted   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   Balance problem 05/06/2018   Syncope 05/06/2018   History of cerebrovascular accident 11/18/2017   CP (cerebral palsy), spastic, 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[place]) 06/25/2017   Anxiety state 06/25/2017   Spasticity 06/25/2017   Allergic rhinitis 10/21/2016   Mild intermittent asthma 10/21/2016      PCP: Terrilyn Saver, NP   REFERRING PROVIDER: Terrilyn Saver, NP   REFERRING DIAG: G80.0 (ICD-10-CM) - CP (cerebral palsy), spastic, 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[place])    THERAPY DIAG:  Spastic quadriplegic cerebral palsy ([place])   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)   Other lack of coordination   Unsteadiness on feet   Difficulty in walking, not elsewhere classified   Left-sided weakness   Muscle weakness (generalized)   Abnormal posture   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 Rechecking CBC, iron, and vitamin D today. She has been on supplementation (refilling iron for her). Will make additional changes based on labs if indicated. PT referral placed to [facility] at her request. No acute concerns today.      PAIN:  Are you having pain? No   PRECAUTIONS: None   WEIGHT BEARING RESTRICTIONS: No   FALLS:  Has patient fallen in last 6 months? Yes. 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 Hobbies- Editor, commissioning, archery, baseball, tennis, dance    PLOF: Needs assistance with ADLs   PATIENT GOALS: Patient would like to improve her flexibility and balance.   NEXT MD VISIT:    OBJECTIVE:    DIAGNOSTIC FINDINGS:  Radiology consult confirms  lumbar scoliosis with apex at L2-3, but since patient is asymptomatic, recommend monitoring it and continue to strengthen.           COGNITION: Overall cognitive status: Within functional limits for tasks assessed                         SENSATION: Not tested     MUSCLE LENGTH: Hamstrings: Right 40 deg; Left 30 deg     POSTURE: left pelvic obliquity, weight shift right, and hips rotated to L with L LE held in IR, increased R WB     LOWER EXTREMITY ROM: Trunk lateraly flexed due to scoliosis, L hip unable to achieve neutral ER, abd limited, B hamstrings tight, L ankle does not achieve neutral DF or inversion. LUE with limited ROM at all joints, in all planes. she wears a splint on her wrist at night.      LOWER EXTREMITY MMT: RLE 4/5, LLE with  fluctuating tone throughout, HS very weak, hips also weak-MMT accuracy questionable due to her tone.   FUNCTIONAL TESTS:  5 times sit to stand: 14.77,  07/03/22 13 seconds Timed up and go (TUG): 13.75 , 07/03/22 = 10 seconds Berg Balance Scale: 48/56   GAIT: Distance walked: clinic distances Assistive device utilized: None Level of assistance: Modified independence Comments: Step-to pattern;Decreased step length - left;Decreased stance time - left;Decreased weight shift to left;Shuffle;Ataxic;Lateral hip instability;Trunk rotated posteriorly on left      TODAY'S TREATMENT:                                                                                                              DATE:  07/24/22 Nustep level 5 x 6 minutes Focus on stretches of the left LE, slow low load long durations stretches to her tolerance Trunk stretches Feet on ball bridges, trunk rotation, isometric abs Bridges Left hip abduction slides  07/22/22 Nustep level 5 x 6 minutes LEg press with ball b/n thighs for proper alignment x 10 with 20#, then left only no weight with manually aligning the left knee no weight 2x7 Supine ball b/n knees bridges Hooklying yellow tband left hip abduction Worked on left hip ER actively Left hip abduction with sliding board PROM/stretches of the HS, piriformis, hip ER, adductors Single leg left leg bridges Prone hip extension needed mod A for this Education with caregiver on the stretches,   07/17/22 Nustep level 5 x 6 minutes Supine PROM left hip ER and adductors Sliding board abduction Supine active hip ER Hooklying clamshells Tried side lying hip abduction and clamshell but very difficult SAQ  07/15/22 Feet on ball knees to chest x10 SAQ 3# 2x10 LLE  Fitter pushes LLE 2x10  Standing on airex marching 2x10 in bars Hip abd on airex 2x10 in bars  Calf raises on airex 2x10 in bars NuStep LE only L5 x81mins  STM to neck and passive stretching    07/03/22 Nustep level  5 x 6 mintues Side stepping, then on airex balance beam [employer]  and hip abduction 2x10 each on the airex Supine hip abduction on sliding board Bridges, bridges with clamshells Hip extension red tband supine SAQ 2.5# Airex volleyball Airex marching  07/01/22 Nustep level 5 x 6 minutes Feet on ball K2C, trunk rotation, small bridge and isometric abs Sliding board and pillow case left hip abduction in supine Supine hip ER with some assist On airex volleyball On airex marching Hooklying working on left hip abduction yellow tband SAQ with 2.5# STM to the upper traps and neck  06/26/22 NuStep L5 x60mins  Leg press 20# 2x10 Step ups on airex- discontinued  Box taps 4" minA CGA 2x10  Side steps with band along mat table red  BFR protocol SAQ 30-15-15-15   PATIENT EDUCATION:  Education details: impact spasticity and inflexibility has on gait and patella tracking during ambulation; encouraged increase in stretch; consider botox or other appropriate intervention with MD regarding spasticity (if needed); updates to HEP Person educated: Patient and Caregiver   Education method: Explanation Education comprehension: verbalized understanding   HOME EXERCISE PROGRAM: [place]  Access Code: [email or web address] URL: [email or web address] Date: 07/17/2022 Prepared by: Lum Babe  Exercises - Supine Hip Internal and External Rotation  - 1 x daily - 7 x weekly - 2 sets - 10 reps - 3 hold - Supine Hip Abduction on Slider  - 1 x daily - 7 x weekly - 2 sets - 10 reps - 3 hold - Hooklying Clamshell with Resistance  - 1 x daily - 7 x weekly - 2 sets - 10 reps - 3 hold - Hip Abduction and Adduction Caregiver PROM  - 1 x daily - 7 x weekly - 1 sets - 5 reps - 30-60 hold - Caregiver PROM Hip Internal External Rotation  - 1 x daily - 7 x weekly - 1 sets - 5 reps - 30-60 hold - Supine Short Arc Quad  - 1 x daily - 7 x weekly - 2 sets - 10 reps - 3 hold ASSESSMENT:   CLINICAL  IMPRESSION: Patient and caregiver reports that she has been busy today and she is concerned that she is walking slower, I felt like she looked to be in more control and it was slower but did not look as spastic with her normal fast walk.  I really focused on her hip flexibility but again tried to be light with the low load long duration stretches.  She did have another engagement after this so we did end sooner than normal OBJECTIVE IMPAIRMENTS: Abnormal gait, decreased balance, decreased coordination, decreased endurance, decreased mobility, difficulty walking, decreased ROM, decreased strength, increased fascial restrictions, increased muscle spasms, impaired flexibility, impaired tone, improper body mechanics, and postural dysfunction.    ACTIVITY LIMITATIONS: standing, squatting, stairs, transfers, and locomotion level   PARTICIPATION LIMITATIONS: shopping, community activity, and occupation   PERSONAL FACTORS: 1 comorbidity: CP  are also affecting patient's functional outcome.    REHAB POTENTIAL: Good   CLINICAL DECISION MAKING: Stable/uncomplicated   EVALUATION COMPLEXITY: Moderate     GOALS: Goals reviewed with patient? Yes   SHORT TERM GOALS: Target date: 05/01/22 I with initial HEP Baseline: Goal status: MET   LONG TERM GOALS: Target date: 4/424   I with final HEP Baseline:  Goal status: ongoing   2.  Improve TUG time to < 12 sec Baseline:  Goal status: ongoing 12.5s 07/03/22  3.  Improve 5 x STS to <12 Baseline:  Goal status:MET 06/05/22   4.  Increase BERG score to at least  44 Baseline:  Goal status: 45/56 MET   5.  Patient will ambulate with normalized gait pattern, minimizing abnormal weight shifts and increasing LLE position to more neutral hip rotation. Baseline:  Goal status: ongoing     PLAN:   PT FREQUENCY: 1-2x/week   PT DURATION: 10 weeks   PLANNED INTERVENTIONS: Therapeutic exercises, Therapeutic activity, Neuromuscular re-education, Balance  training, Gait training, Patient/Family education, Self Care, Joint mobilization, Dry Needling, Cryotherapy, Moist heat, Ionotophoresis 4mg /ml Dexamethasone, and Manual therapy   PLAN FOR NEXT SESSION: work on hip ER and abduction as well as quad strength 5:00 PM,07/24/22 Lum Babe, PT

## 2022-08-12 ENCOUNTER — Encounter: Payer: Self-pay | Admitting: Physical Therapy

## 2022-08-14 ENCOUNTER — Ambulatory Visit: Payer: BC Managed Care – PPO | Attending: Neurology | Admitting: Physical Therapy

## 2022-08-14 ENCOUNTER — Encounter: Payer: Self-pay | Admitting: Physical Therapy

## 2022-08-14 DIAGNOSIS — M6281 Muscle weakness (generalized): Secondary | ICD-10-CM | POA: Insufficient documentation

## 2022-08-14 DIAGNOSIS — R262 Difficulty in walking, not elsewhere classified: Secondary | ICD-10-CM | POA: Insufficient documentation

## 2022-08-14 DIAGNOSIS — G801 Spastic diplegic cerebral palsy: Secondary | ICD-10-CM | POA: Diagnosis present

## 2022-08-14 DIAGNOSIS — G800 Spastic quadriplegic cerebral palsy: Secondary | ICD-10-CM | POA: Diagnosis not present

## 2022-08-14 DIAGNOSIS — R2681 Unsteadiness on feet: Secondary | ICD-10-CM | POA: Insufficient documentation

## 2022-08-14 DIAGNOSIS — I69954 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: Secondary | ICD-10-CM | POA: Insufficient documentation

## 2022-08-14 NOTE — Therapy (Signed)
OUTPATIENT PHYSICAL THERAPY TREATMENT   Patient Name: Kerri Robertson MRN: [id number] DOB:2001/02/03, 22 y.o., female Today's Date: 04/10/2022   END OF SESSION:  PT End of Session - 08/14/22 1446     Visit Number 21    Date for PT Re-Evaluation 10/14/22    PT Start Time 1440    PT Stop Time 1527    PT Time Calculation (min) 47 min    Activity Tolerance Patient tolerated treatment well    Behavior During Therapy WFL for tasks assessed/performed                       Past Medical History:  Diagnosis Date   Allergy     Cerebral palsy ([name])     Stroke [facility])           Past Surgical History:  Procedure Laterality Date   ARM SURGERY        2013   FOOT SURGERY        2010   HAMSTRING LENGTHENING       TIBIA / FIBIA LENGTHENING            Patient Active Problem List    Diagnosis Date Noted   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   Balance problem 05/06/2018   Syncope 05/06/2018   History of cerebrovascular accident 11/18/2017   CP (cerebral palsy), spastic, 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[name]) 06/25/2017   Anxiety state 06/25/2017   Spasticity 06/25/2017   Allergic rhinitis 10/21/2016   Mild intermittent asthma 10/21/2016      PCP: Clayborne Dana, NP   REFERRING PROVIDER: Clayborne Dana, NP   REFERRING DIAG: G80.0 (ICD-10-CM) - CP (cerebral palsy), spastic, 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[name])    THERAPY DIAG:  Spastic quadriplegic cerebral palsy ([name])   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)   Other lack of coordination   Unsteadiness on feet   Difficulty in walking, not elsewhere classified   Left-sided weakness   Muscle weakness (generalized)   Abnormal posture   Cerebral palsy with spastic diplegia ([name])   Rationale for Evaluation and Treatment: Rehabilitation   ONSET DATE: 03/17/22   SUBJECTIVE:    SUBJECTIVE STATEMENT: Reports that she has  continued to be very busy, reports that she is very tired denies pain PERTINENT HISTORY:     CP (cerebral palsy), spastic, 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[name]) - Primary         She is stable, at baseline (ambulatory, but with with some gait abnormalities, balance issues, and weakness). Rechecking CBC, iron, and vitamin D today. She has been on supplementation (refilling iron for her). Will make additional changes based on labs if indicated. PT referral placed to [facility] at her request. No acute concerns today.      PAIN:  Are you having pain? No   PRECAUTIONS: None   WEIGHT BEARING RESTRICTIONS: No   FALLS:  Has patient fallen in last 6 months? Yes. 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 Hobbies- Runner, broadcasting/film/video, archery, baseball, tennis, dance    PLOF: Needs assistance with ADLs   PATIENT GOALS: Patient would like to improve her flexibility and balance.   NEXT MD VISIT:    OBJECTIVE:    DIAGNOSTIC FINDINGS:  Radiology consult confirms lumbar scoliosis  with apex at L2-3, but since patient is asymptomatic, recommend monitoring it and continue to strengthen.           COGNITION: Overall cognitive status: Within functional limits for tasks assessed                         SENSATION: Not tested     MUSCLE LENGTH: Hamstrings: Right 40 deg; Left 30 deg     POSTURE: left pelvic obliquity, weight shift right, and hips rotated to L with L LE held in IR, increased R WB     LOWER EXTREMITY ROM: Trunk lateraly flexed due to scoliosis, L hip unable to achieve neutral ER, abd limited, B hamstrings tight, L ankle does not achieve neutral DF or inversion. LUE with limited ROM at all joints, in all planes. she wears a splint on her wrist at night.      LOWER EXTREMITY MMT: RLE 4/5, LLE with fluctuating  tone throughout, HS very weak, hips also weak-MMT accuracy questionable due to her tone.   FUNCTIONAL TESTS:  5 times sit to stand: 14.77,  07/03/22 13 seconds Timed up and go (TUG): 13.75 , 07/03/22 = 10 seconds Berg Balance Scale: 48/56   GAIT: Distance walked: clinic distances Assistive device utilized: None Level of assistance: Modified independence Comments: Step-to pattern;Decreased step length - left;Decreased stance time - left;Decreased weight shift to left;Shuffle;Ataxic;Lateral hip instability;Trunk rotated posteriorly on left      TODAY'S TREATMENT:                                                                                                              DATE:  08/14/22 Nustep level 5 x 6 minutes Low load very long duration stretches without pain for the left LE all motions and stretches in supine, side lying and prone  Active hip abduction supine with use of sliding board Active hip ER in supine with straight leg Hooklying yellow tband hip abduction left only bridges  07/24/22 Nustep level 5 x 6 minutes Focus on stretches of the left LE, slow low load long durations stretches to her tolerance Trunk stretches Feet on ball bridges, trunk rotation, isometric abs Bridges Left hip abduction slides  07/22/22 Nustep level 5 x 6 minutes LEg press with ball b/n thighs for proper alignment x 10 with 20#, then left only no weight with manually aligning the left knee no weight 2x7 Supine ball b/n knees bridges Hooklying yellow tband left hip abduction Worked on left hip ER actively Left hip abduction with sliding board PROM/stretches of the HS, piriformis, hip ER, adductors Single leg left leg bridges Prone hip extension needed mod A for this Education with caregiver on the stretches,   07/17/22 Nustep level 5 x 6 minutes Supine PROM left hip ER and adductors Sliding board abduction Supine active hip ER Hooklying clamshells Tried side lying hip abduction and clamshell  but very difficult SAQ  07/15/22 Feet on ball knees to chest x10 SAQ 3# 2x10 LLE  Fitter pushes LLE  2x10  Standing on airex marching 2x10 in bars Hip abd on airex 2x10 in bars  Calf raises on airex 2x10 in bars NuStep LE only L5 x516mins  STM to neck and passive stretching    07/03/22 Nustep level 5 x 6 mintues Side stepping, then on airex balance beam Marches and hip abduction 2x10 each on the airex Supine hip abduction on sliding board Bridges, bridges with clamshells Hip extension red tband supine SAQ 2.5# Airex volleyball Airex marching  07/01/22 Nustep level 5 x 6 minutes Feet on ball K2C, trunk rotation, small bridge and isometric abs Sliding board and pillow case left hip abduction in supine Supine hip ER with some assist On airex volleyball On airex marching Hooklying working on left hip abduction yellow tband SAQ with 2.5# STM to the upper traps and neck  06/26/22 NuStep L5 x146mins  Leg press 20# 2x10 Step ups on airex- discontinued  Box taps 4" minA CGA 2x10  Side steps with band along mat table red  BFR protocol SAQ 30-15-15-15   PATIENT EDUCATION:  Education details: impact spasticity and inflexibility has on gait and patella tracking during ambulation; encouraged increase in stretch; consider botox or other appropriate intervention with MD regarding spasticity (if needed); updates to HEP Person educated: Patient and Caregiver   Education method: Explanation Education comprehension: verbalized understanding   HOME EXERCISE PROGRAM: [email or web address]  Access Code: [email or web address] URL: [email or web address] Date: 07/17/2022 Prepared by: Kerri GlazeMichael Shirrell Solinger  Exercises - Supine Hip Internal and External Rotation  - 1 x daily - 7 x weekly - 2 sets - 10 reps - 3 hold - Supine Hip Abduction on Slider  - 1 x daily - 7 x weekly - 2 sets - 10 reps - 3 hold - Hooklying Clamshell with Resistance  - 1 x daily - 7 x weekly - 2 sets - 10 reps - 3 hold - Hip Abduction  and Adduction Caregiver PROM  - 1 x daily - 7 x weekly - 1 sets - 5 reps - 30-60 hold - Caregiver PROM Hip Internal External Rotation  - 1 x daily - 7 x weekly - 1 sets - 5 reps - 30-60 hold - Supine Short Arc Quad  - 1 x daily - 7 x weekly - 2 sets - 10 reps - 3 hold ASSESSMENT:   CLINICAL IMPRESSION: Really continued the low load long duration stretches of the left LE to see if this will help her gait and decrease stress on the left knee.  She tolerates this well, only had some pain with the left HS stretch, I feel like she had a little more actoive abduction and ER of the left hip but only in supine with the leg straight OBJECTIVE IMPAIRMENTS: Abnormal gait, decreased balance, decreased coordination, decreased endurance, decreased mobility, difficulty walking, decreased ROM, decreased strength, increased fascial restrictions, increased muscle spasms, impaired flexibility, impaired tone, improper body mechanics, and postural dysfunction.    ACTIVITY LIMITATIONS: standing, squatting, stairs, transfers, and locomotion level   PARTICIPATION LIMITATIONS: shopping, community activity, and occupation   PERSONAL FACTORS: 1 comorbidity: CP  are also affecting patient's functional outcome.    REHAB POTENTIAL: Good   CLINICAL DECISION MAKING: Stable/uncomplicated   EVALUATION COMPLEXITY: Moderate     GOALS: Goals reviewed with patient? Yes   SHORT TERM GOALS: Target date: 05/01/22 I with initial HEP Baseline: Goal status: MET   LONG TERM GOALS: Target date: 4/424   I with final HEP Baseline:  Goal status: ongoing   2.  Improve TUG time to < 12 sec Baseline:  Goal status: met 08/14/22  3.  Improve 5 x STS to <12 Baseline:  Goal status:MET 06/05/22   4.  Increase BERG score to at least 44 Baseline:  Goal status: 45/56 MET   5.  Patient will ambulate with normalized gait pattern, minimizing abnormal weight shifts and increasing LLE position to more neutral hip rotation. Baseline:   Goal status: ongoing     PLAN:   PT FREQUENCY: 1-2x/week   PT DURATION: 10 weeks   PLANNED INTERVENTIONS: Therapeutic exercises, Therapeutic activity, Neuromuscular re-education, Balance training, Gait training, Patient/Family education, Self Care, Joint mobilization, Dry Needling, Cryotherapy, Moist heat, Ionotophoresis 4mg /ml Dexamethasone, and Manual therapy   PLAN FOR NEXT SESSION: work on hip ER and abduction as well as quad strength 3:24 PM,08/14/22 Kerri Glaze, PT

## 2022-08-18 ENCOUNTER — Encounter: Payer: Self-pay | Admitting: *Deleted

## 2022-08-19 ENCOUNTER — Ambulatory Visit: Payer: BC Managed Care – PPO | Admitting: Physical Therapy

## 2022-08-19 ENCOUNTER — Encounter: Payer: Self-pay | Admitting: Physical Therapy

## 2022-08-19 DIAGNOSIS — M6281 Muscle weakness (generalized): Secondary | ICD-10-CM

## 2022-08-19 DIAGNOSIS — G800 Spastic quadriplegic cerebral palsy: Secondary | ICD-10-CM

## 2022-08-19 DIAGNOSIS — R2681 Unsteadiness on feet: Secondary | ICD-10-CM

## 2022-08-19 DIAGNOSIS — R262 Difficulty in walking, not elsewhere classified: Secondary | ICD-10-CM

## 2022-08-19 NOTE — Therapy (Signed)
OUTPATIENT PHYSICAL THERAPY TREATMENT   Patient Name: Kerri Robertson MRN: [id number] DOB:[birth year], 22 y.o., female Today's Date: 04/10/2022   END OF SESSION:  PT End of Session - 08/19/22 1440     Visit Number 22    Date for PT Re-Evaluation 10/14/22    PT Start Time 1440    PT Stop Time 1523    PT Time Calculation (min) 43 min    Activity Tolerance Patient tolerated treatment well    Behavior During Therapy WFL for tasks assessed/performed                       Past Medical History:  Diagnosis Date   Allergy     Cerebral palsy ([name])     Stroke ([name])           Past Surgical History:  Procedure Laterality Date   ARM SURGERY        2013   FOOT SURGERY        2010   HAMSTRING LENGTHENING       TIBIA / FIBIA LENGTHENING            Patient Active Problem List    Diagnosis Date Noted   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   Balance problem 05/06/2018   Syncope 05/06/2018   History of cerebrovascular accident 11/18/2017   CP (cerebral palsy), spastic, 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[name]) 06/25/2017   Anxiety state 06/25/2017   Spasticity 06/25/2017   Allergic rhinitis 10/21/2016   Mild intermittent asthma 10/21/2016      PCP: Clayborne Dana, NP   REFERRING PROVIDER: Clayborne Dana, NP   REFERRING DIAG: G80.0 (ICD-10-CM) - CP (cerebral palsy), spastic, 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[name])    THERAPY DIAG:  Spastic quadriplegic cerebral palsy ([name])   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  She thinks that she is walking a little easier  PERTINENT HISTORY:     CP (cerebral palsy), spastic, 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[name]) - Primary         She is stable, at baseline (ambulatory, but with with some gait abnormalities, balance issues, and weakness). Rechecking CBC, iron, and vitamin D today. She has been on supplementation (refilling iron for her). Will make additional changes based on labs if indicated. PT referral placed to [facility] at her request. No acute concerns today.      PAIN:  Are you having pain? No   PRECAUTIONS: None   WEIGHT BEARING RESTRICTIONS: No   FALLS:  Has patient fallen in last 6 months? Yes. 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 Hobbies- Runner, broadcasting/film/video, archery, baseball, tennis, dance    PLOF: Needs assistance with ADLs   PATIENT GOALS: Patient would like to improve her flexibility and balance.   NEXT MD VISIT:    OBJECTIVE:  DIAGNOSTIC FINDINGS:  Radiology consult confirms lumbar scoliosis with apex at L2-3, but since patient is asymptomatic, recommend monitoring it and continue to strengthen.           COGNITION: Overall cognitive status: Within functional limits for tasks assessed                         SENSATION: Not tested     MUSCLE LENGTH: Hamstrings: Right 40 deg; Left 30 deg     POSTURE: left pelvic obliquity, weight shift right, and hips rotated to L with L LE held in IR, increased R WB     LOWER EXTREMITY ROM: Trunk lateraly flexed due to scoliosis, L hip unable to achieve neutral ER, abd limited, B hamstrings tight, L ankle does not achieve neutral DF or inversion. LUE with limited ROM at all joints, in all planes. she wears a splint on her wrist at night.      LOWER EXTREMITY  MMT: RLE 4/5, LLE with fluctuating tone throughout, HS very weak, hips also weak-MMT accuracy questionable due to her tone.   FUNCTIONAL TESTS:  5 times sit to stand: 14.77,  07/03/22 13 seconds Timed up and go (TUG): 13.75 , 07/03/22 = 10 seconds Berg Balance Scale: 48/56   GAIT: Distance walked: clinic distances Assistive device utilized: None Level of assistance: Modified independence Comments: Step-to pattern;Decreased step length - left;Decreased stance time - left;Decreased weight shift to left;Shuffle;Ataxic;Lateral hip instability;Trunk rotated posteriorly on left      TODAY'S TREATMENT:                                                                                                              DATE:  08/19/22 Nustep level 5x 6 minutes 2.5# LAQ  20# leg press bilateral x 10, then left only with PT holding knee neutral so it does not go into valgus 2.5# SAQ Yellow tband left hip abduction in hooklying Left hip ER in supine Bridge ball b/n knees  08/14/22 Nustep level 5 x 6 minutes Low load very long duration stretches without pain for the left LE all motions and stretches in supine, side lying and prone  Active hip abduction supine with use of sliding board Active hip ER in supine with straight leg Hooklying yellow tband hip abduction left only bridges  07/24/22 Nustep level 5 x 6 minutes Focus on stretches of the left LE, slow low load long durations stretches to her tolerance Trunk stretches Feet on ball bridges, trunk rotation, isometric abs Bridges Left hip abduction slides  07/22/22 Nustep level 5 x 6 minutes LEg press with ball b/n thighs for proper alignment x 10 with 20#, then left only no weight with manually aligning the left knee no weight 2x7 Supine ball b/n knees bridges Hooklying yellow tband left hip abduction Worked on left hip ER actively Left hip abduction with sliding board PROM/stretches of the HS, piriformis, hip ER, adductors Single leg left  leg bridges Prone hip extension needed mod A for this Education with  caregiver on the stretches,   07/17/22 Nustep level 5 x 6 minutes Supine PROM left hip ER and adductors Sliding board abduction Supine active hip ER Hooklying clamshells Tried side lying hip abduction and clamshell but very difficult SAQ  3 PATIENT EDUCATION:  Education details: impact spasticity and inflexibility has on gait and patella tracking during ambulation; encouraged increase in stretch; consider botox or other appropriate intervention with MD regarding spasticity (if needed); updates to HEP Person educated: Patient and Caregiver   Education method: Explanation Education comprehension: verbalized understanding   HOME EXERCISE PROGRAM: [email or web address]  Access Code: [email or web address] URL: [email or web address] Date: 07/17/2022 Prepared by: Stacie Glaze  Exercises - Supine Hip Internal and External Rotation  - 1 x daily - 7 x weekly - 2 sets - 10 reps - 3 hold - Supine Hip Abduction on Slider  - 1 x daily - 7 x weekly - 2 sets - 10 reps - 3 hold - Hooklying Clamshell with Resistance  - 1 x daily - 7 x weekly - 2 sets - 10 reps - 3 hold - Hip Abduction and Adduction Caregiver PROM  - 1 x daily - 7 x weekly - 1 sets - 5 reps - 30-60 hold - Caregiver PROM Hip Internal External Rotation  - 1 x daily - 7 x weekly - 1 sets - 5 reps - 30-60 hold - Supine Short Arc Quad  - 1 x daily - 7 x weekly - 2 sets - 10 reps - 3 hold ASSESSMENT:   CLINICAL IMPRESSION: Patient and caregiver reports that they think she is moving better, she denies falls and or pain.  I continued to focus on the stretching and talked with the caregiver about doing this at home.  I feel that she got really good left hip abduction today in hooklying against the yellow tband..     OBJECTIVE IMPAIRMENTS: Abnormal gait, decreased balance, decreased coordination, decreased endurance, decreased mobility, difficulty walking, decreased ROM, decreased  strength, increased fascial restrictions, increased muscle spasms, impaired flexibility, impaired tone, improper body mechanics, and postural dysfunction.    ACTIVITY LIMITATIONS: standing, squatting, stairs, transfers, and locomotion level   PARTICIPATION LIMITATIONS: shopping, community activity, and occupation   PERSONAL FACTORS: 1 comorbidity: CP  are also affecting patient's functional outcome.    REHAB POTENTIAL: Good   CLINICAL DECISION MAKING: Stable/uncomplicated   EVALUATION COMPLEXITY: Moderate     GOALS: Goals reviewed with patient? Yes   SHORT TERM GOALS: Target date: 05/01/22 I with initial HEP Baseline: Goal status: MET   LONG TERM GOALS: Target date: 4/424   I with final HEP Baseline:  Goal status: progressing with her and caregiver 08/19/22   2.  Improve TUG time to < 12 sec Baseline:  Goal status: met 08/14/22  3.  Improve 5 x STS to <12 Baseline:  Goal status:MET 06/05/22   4.  Increase BERG score to at least 44 Baseline:  Goal status: 45/56 MET   5.  Patient will ambulate with normalized gait pattern, minimizing abnormal weight shifts and increasing LLE position to more neutral hip rotation. Baseline:  Goal status: ongoing 08/19/22     PLAN:   PT FREQUENCY: 1-2x/week   PT DURATION: 10 weeks   PLANNED INTERVENTIONS: Therapeutic exercises, Therapeutic activity, Neuromuscular re-education, Balance training, Gait training, Patient/Family education, Self Care, Joint mobilization, Dry Needling, Cryotherapy, Moist heat, Ionotophoresis 4mg /ml Dexamethasone, and Manual therapy   PLAN FOR NEXT SESSION: work on hip ER and abduction as well as quad strength 2:41 PM,08/19/22  Stacie Glaze, PT

## 2022-08-20 NOTE — Therapy (Signed)
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   Balance problem 05/06/2018   Syncope 05/06/2018   History of cerebrovascular accident 11/18/2017   CP (cerebral palsy), spastic, 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[name]) 06/25/2017   Anxiety state 06/25/2017   Spasticity 06/25/2017   Allergic rhinitis 10/21/2016   Mild intermittent asthma 10/21/2016      PCP: Clayborne Dana, NP   REFERRING PROVIDER: Clayborne Dana, NP   REFERRING DIAG: G80.0 (ICD-10-CM) - CP (cerebral palsy), spastic, 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[name])    THERAPY DIAG:  Spastic quadriplegic cerebral palsy ([name])   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  She thinks that she is walking a little easier  PERTINENT HISTORY:     CP (cerebral palsy), spastic, 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[name]) - Primary         She is stable, at baseline (ambulatory, but with with some gait abnormalities, balance issues, and  weakness). Rechecking CBC, iron, and vitamin D today. She has been on supplementation (refilling iron for her). Will make additional changes based on labs if indicated. PT referral placed to [facility] at her request. No acute concerns today.      PAIN:  Are you having pain? No   PRECAUTIONS: None   WEIGHT BEARING RESTRICTIONS: No   FALLS:  Has patient fallen in last 6 months? Yes. 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 Hobbies- Runner, broadcasting/film/video, archery, baseball, tennis, dance    PLOF: Needs assistance with ADLs   PATIENT GOALS: Patient would like to improve her flexibility and balance.   NEXT MD VISIT:    OBJECTIVE:    DIAGNOSTIC FINDINGS:  Radiology consult confirms lumbar scoliosis with apex at L2-3, but since patient is asymptomatic, recommend monitoring it and continue to strengthen.           COGNITION: Overall cognitive status: Within functional limits for tasks assessed                         SENSATION: Not tested  MUSCLE LENGTH: Hamstrings: Right 40 deg; Left 30 deg     POSTURE: left pelvic obliquity, weight shift right, and hips rotated to L with L LE held in IR, increased R WB     LOWER EXTREMITY ROM: Trunk lateraly flexed due to scoliosis, L hip unable to achieve neutral ER, abd limited, B hamstrings tight, L ankle does not achieve neutral DF or inversion. LUE with limited ROM at all joints, in all planes. she wears a splint on her wrist at night.      LOWER EXTREMITY MMT: RLE 4/5, LLE with fluctuating tone throughout, HS very weak, hips also weak-MMT accuracy questionable due to her tone.   FUNCTIONAL TESTS:  5 times sit to stand: 14.77,  07/03/22 13 seconds Timed up and go (TUG): 13.75 , 07/03/22 = 10 seconds Berg Balance Scale: 48/56   GAIT: Distance  walked: clinic distances Assistive device utilized: None Level of assistance: Modified independence Comments: Step-to pattern;Decreased step length - left;Decreased stance time - left;Decreased weight shift to left;Shuffle;Ataxic;Lateral hip instability;Trunk rotated posteriorly on left      TODAY'S TREATMENT:                                                                                                              DATE:  08/21/22 NuStep Supine hip abd with band  Supine ball squeeze  Ball squeeze with bridge Ball squeeze with LAQ 2#  Sidelying abd    08/19/22 Nustep level 5x 6 minutes 2.5# LAQ  20# leg press bilateral x 10, then left only with PT holding knee neutral so it does not go into valgus 2.5# SAQ Yellow tband left hip abduction in hooklying Left hip ER in supine Bridge ball b/n knees  08/14/22 Nustep level 5 x 6 minutes Low load very long duration stretches without pain for the left LE all motions and stretches in supine, side lying and prone  Active hip abduction supine with use of sliding board Active hip ER in supine with straight leg Hooklying yellow tband hip abduction left only bridges  07/24/22 Nustep level 5 x 6 minutes Focus on stretches of the left LE, slow low load long durations stretches to her tolerance Trunk stretches Feet on ball bridges, trunk rotation, isometric abs Bridges Left hip abduction slides  07/22/22 Nustep level 5 x 6 minutes LEg press with ball b/n thighs for proper alignment x 10 with 20#, then left only no weight with manually aligning the left knee no weight 2x7 Supine ball b/n knees bridges Hooklying yellow tband left hip abduction Worked on left hip ER actively Left hip abduction with sliding board PROM/stretches of the HS, piriformis, hip ER, adductors Single leg left leg bridges Prone hip extension needed mod A for this Education with caregiver on the stretches,   07/17/22 Nustep level 5 x 6 minutes Supine PROM left hip  ER and adductors Sliding board abduction Supine active hip ER Hooklying clamshells Tried side lying hip abduction and clamshell but very difficult SAQ  3 PATIENT EDUCATION:  Education details: impact  spasticity and inflexibility has on gait and patella tracking during ambulation; encouraged increase in stretch; consider botox or other appropriate intervention with MD regarding spasticity (if needed); updates to HEP Person educated: Patient and Caregiver   Education method: Explanation Education comprehension: verbalized understanding   HOME EXERCISE PROGRAM: [email or web address]  Access Code: [email or web address] URL: [email or web address] Date: 07/17/2022 Prepared by: Stacie Glaze  Exercises - Supine Hip Internal and External Rotation  - 1 x daily - 7 x weekly - 2 sets - 10 reps - 3 hold - Supine Hip Abduction on Slider  - 1 x daily - 7 x weekly - 2 sets - 10 reps - 3 hold - Hooklying Clamshell with Resistance  - 1 x daily - 7 x weekly - 2 sets - 10 reps - 3 hold - Hip Abduction and Adduction Caregiver PROM  - 1 x daily - 7 x weekly - 1 sets - 5 reps - 30-60 hold - Caregiver PROM Hip Internal External Rotation  - 1 x daily - 7 x weekly - 1 sets - 5 reps - 30-60 hold - Supine Short Arc Quad  - 1 x daily - 7 x weekly - 2 sets - 10 reps - 3 hold ASSESSMENT:   CLINICAL IMPRESSION: Patient and caregiver reports that they think she is moving better, she denies falls and or pain.  I continued to focus on the stretching and talked with the caregiver about doing this at home.  I feel that she got really good left hip abduction today in hooklying against the yellow tband..       OBJECTIVE IMPAIRMENTS: Abnormal gait, decreased balance, decreased coordination, decreased endurance, decreased mobility, difficulty walking, decreased ROM, decreased strength, increased fascial restrictions, increased muscle spasms, impaired flexibility, impaired tone, improper body mechanics, and postural dysfunction.     ACTIVITY LIMITATIONS: standing, squatting, stairs, transfers, and locomotion level   PARTICIPATION LIMITATIONS: shopping, community activity, and occupation   PERSONAL FACTORS: 1 comorbidity: CP  are also affecting patient's functional outcome.    REHAB POTENTIAL: Good   CLINICAL DECISION MAKING: Stable/uncomplicated   EVALUATION COMPLEXITY: Moderate     GOALS: Goals reviewed with patient? Yes   SHORT TERM GOALS: Target date: 05/01/22 I with initial HEP Baseline: Goal status: MET   LONG TERM GOALS: Target date: 10/14/22   I with final HEP Baseline:  Goal status: progressing with her and caregiver 08/19/22   2.  Improve TUG time to < 12 sec Baseline:  Goal status: met 08/14/22  3.  Improve 5 x STS to <12 Baseline:  Goal status:MET 06/05/22   4.  Increase BERG score to at least 44 Baseline:  Goal status: 45/56 MET   5.  Patient will ambulate with normalized gait pattern, minimizing abnormal weight shifts and increasing LLE position to more neutral hip rotation. Baseline:  Goal status: ongoing 08/19/22     PLAN:   PT FREQUENCY: 1-2x/week   PT DURATION: 10 weeks   PLANNED INTERVENTIONS: Therapeutic exercises, Therapeutic activity, Neuromuscular re-education, Balance training, Gait training, Patient/Family education, Self Care, Joint mobilization, Dry Needling, Cryotherapy, Moist heat, Ionotophoresis 4mg /ml Dexamethasone, and Manual therapy   PLAN FOR NEXT SESSION: work on hip ER and abduction as well as quad strength 3:12 PM,08/20/22 Stacie Glaze, PT

## 2022-08-21 ENCOUNTER — Ambulatory Visit: Payer: BC Managed Care – PPO

## 2022-08-21 DIAGNOSIS — G800 Spastic quadriplegic cerebral palsy: Secondary | ICD-10-CM

## 2022-08-21 DIAGNOSIS — R262 Difficulty in walking, not elsewhere classified: Secondary | ICD-10-CM

## 2022-08-21 DIAGNOSIS — R2681 Unsteadiness on feet: Secondary | ICD-10-CM

## 2022-08-21 DIAGNOSIS — M6281 Muscle weakness (generalized): Secondary | ICD-10-CM

## 2022-08-21 DIAGNOSIS — I69954 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: Secondary | ICD-10-CM

## 2022-08-26 ENCOUNTER — Encounter: Payer: Self-pay | Admitting: Physical Therapy

## 2022-08-26 ENCOUNTER — Ambulatory Visit: Payer: BC Managed Care – PPO | Admitting: Physical Therapy

## 2022-08-26 DIAGNOSIS — M6281 Muscle weakness (generalized): Secondary | ICD-10-CM

## 2022-08-26 DIAGNOSIS — G800 Spastic quadriplegic cerebral palsy: Secondary | ICD-10-CM

## 2022-08-26 DIAGNOSIS — R2681 Unsteadiness on feet: Secondary | ICD-10-CM

## 2022-08-26 DIAGNOSIS — R262 Difficulty in walking, not elsewhere classified: Secondary | ICD-10-CM

## 2022-08-26 NOTE — Therapy (Signed)
OUTPATIENT PHYSICAL THERAPY TREATMENT   Patient Name: Kerri Robertson MRN: [id number] DOB:10-04-2000, 22 y.o., female Today's Date: 04/10/2022   END OF SESSION:  PT End of Session - 08/26/22 1445     Visit Number 24    Date for PT Re-Evaluation 10/14/22    PT Start Time 1443    PT Stop Time 1526    PT Time Calculation (min) 43 min    Activity Tolerance Patient tolerated treatment well    Behavior During Therapy WFL for tasks assessed/performed                        Past Medical History:  Diagnosis Date   Allergy     Cerebral palsy ([name])     Stroke ([name])           Past Surgical History:  Procedure Laterality Date   ARM SURGERY        2013   FOOT SURGERY        2010   HAMSTRING LENGTHENING       TIBIA / FIBIA LENGTHENING            Patient Active Problem List    Diagnosis Date Noted   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   Balance problem 05/06/2018   Syncope 05/06/2018   History of cerebrovascular accident 11/18/2017   CP (cerebral palsy), spastic, 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[name]) 06/25/2017   Anxiety state 06/25/2017   Spasticity 06/25/2017   Allergic rhinitis 10/21/2016   Mild intermittent asthma 10/21/2016      PCP: Clayborne Dana, NP   REFERRING PROVIDER: Clayborne Dana, NP   REFERRING DIAG: G80.0 (ICD-10-CM) - CP (cerebral palsy), spastic, 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[name])    THERAPY DIAG:  Spastic quadriplegic cerebral palsy ([name])   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 Rechecking CBC, iron, and vitamin D today. She has been on supplementation (refilling iron for her). Will make additional changes based on labs if indicated. PT referral placed to [facility] at her request. No acute concerns today.      PAIN:  Are you having pain? No   PRECAUTIONS: None   WEIGHT BEARING RESTRICTIONS: No   FALLS:  Has patient fallen in last 6 months? Yes. 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 Hobbies- Runner, broadcasting/film/video, archery, baseball, tennis, dance    PLOF: Needs assistance with ADLs   PATIENT GOALS: Patient would like to improve her flexibility and balance.   NEXT MD VISIT:    OBJECTIVE:    DIAGNOSTIC FINDINGS:  Radiology consult confirms lumbar scoliosis with apex at L2-3, but since patient is  asymptomatic, recommend monitoring it and continue to strengthen.           COGNITION: Overall cognitive status: Within functional limits for tasks assessed                         SENSATION: Not tested     MUSCLE LENGTH: Hamstrings: Right 40 deg; Left 30 deg     POSTURE: left pelvic obliquity, weight shift right, and hips rotated to L with L LE held in IR, increased R WB     LOWER EXTREMITY ROM: Trunk lateraly flexed due to scoliosis, L hip unable to achieve neutral ER, abd limited, B hamstrings tight, L ankle does not achieve neutral DF or inversion. LUE with limited ROM at all joints, in all planes. she wears a splint on her wrist at night.      LOWER EXTREMITY MMT: RLE 4/5, LLE with fluctuating tone throughout, HS very weak, hips also weak-MMT  accuracy questionable due to her tone.   FUNCTIONAL TESTS:  5 times sit to stand: 14.77,  07/03/22 13 seconds Timed up and go (TUG): 13.75 , 07/03/22 = 10 seconds Berg Balance Scale: 48/56   GAIT: Distance walked: clinic distances Assistive device utilized: None Level of assistance: Modified independence Comments: Step-to pattern;Decreased step length - left;Decreased stance time - left;Decreased weight shift to left;Shuffle;Ataxic;Lateral hip instability;Trunk rotated posteriorly on left      TODAY'S TREATMENT:                                                                                                              DATE:  08/26/22 Nustep level 5 x 5 mintues  on airex ball toss  Volleyball Bosu standing Airex balance beam side stepping and tandem walking Stretching of the left LE Left hip abduction Left hip ER Bridges, bridges with left hip  08/21/22 NuStep L5 x64mins Supine hip abd with band yellow 2x10  Supine ball squeeze 2x10 Ball squeeze with bridge 2x10 Sidelying abd 2x10  Ball squeeze with LAQ 2# 2x10  STS on airex 2x10- minA  20# leg press bilateral 2x10   08/19/22 Nustep level 5x 6 minutes 2.5# LAQ  20# leg press bilateral x 10, then left only with PT holding knee neutral so it does not go into valgus 2.5# SAQ Yellow tband left hip abduction in hooklying Left hip ER in supine Bridge ball b/n knees  08/14/22 Nustep level 5 x 6 minutes Low load very long duration stretches without pain for the left LE all motions and stretches in supine, side lying and prone  Active hip abduction supine with use of sliding board Active hip ER in supine with straight leg Hooklying yellow tband hip abduction left only bridges  07/24/22 Nustep level 5 x 6 minutes Focus on stretches of the left LE, slow low load long durations stretches to her tolerance Trunk stretches Feet on ball bridges, trunk rotation, isometric abs Bridges Left hip abduction slides  07/22/22 Nustep  level 5 x 6 minutes LEg press with  ball b/n thighs for proper alignment x 10 with 20#, then left only no weight with manually aligning the left knee no weight 2x7 Supine ball b/n knees bridges Hooklying yellow tband left hip abduction Worked on left hip ER actively Left hip abduction with sliding board PROM/stretches of the HS, piriformis, hip ER, adductors Single leg left leg bridges Prone hip extension needed mod A for this Education with caregiver on the stretches,   07/17/22 Nustep level 5 x 6 minutes Supine PROM left hip ER and adductors Sliding board abduction Supine active hip ER Hooklying clamshells Tried side lying hip abduction and clamshell but very difficult SAQ  3 PATIENT EDUCATION:  Education details: impact spasticity and inflexibility has on gait and patella tracking during ambulation; encouraged increase in stretch; consider botox or other appropriate intervention with MD regarding spasticity (if needed); updates to HEP Person educated: Patient and Caregiver   Education method: Explanation Education comprehension: verbalized understanding   HOME EXERCISE PROGRAM: [email or web address]  Access Code: [email or web address] URL: [email or web address] Date: 07/17/2022 Prepared by: Stacie Glaze  Exercises - Supine Hip Internal and External Rotation  - 1 x daily - 7 x weekly - 2 sets - 10 reps - 3 hold - Supine Hip Abduction on Slider  - 1 x daily - 7 x weekly - 2 sets - 10 reps - 3 hold - Hooklying Clamshell with Resistance  - 1 x daily - 7 x weekly - 2 sets - 10 reps - 3 hold - Hip Abduction and Adduction Caregiver PROM  - 1 x daily - 7 x weekly - 1 sets - 5 reps - 30-60 hold - Caregiver PROM Hip Internal External Rotation  - 1 x daily - 7 x weekly - 1 sets - 5 reps - 30-60 hold - Supine Short Arc Quad  - 1 x daily - 7 x weekly - 2 sets - 10 reps - 3 hold ASSESSMENT:   CLINICAL IMPRESSION: Her and caregiver feel that she had some difficulty with balance this morning.   They report doing the exercises at home and I really feel that the ability for her to abduct and internally rotate the left LE is better with easier movements and less trunk motions to initiate the movements.  I did add some higher level balance activities today, she struggled on the dynamic surfaces of the bosu and the airex balance beam   OBJECTIVE IMPAIRMENTS: Abnormal gait, decreased balance, decreased coordination, decreased endurance, decreased mobility, difficulty walking, decreased ROM, decreased strength, increased fascial restrictions, increased muscle spasms, impaired flexibility, impaired tone, improper body mechanics, and postural dysfunction.    ACTIVITY LIMITATIONS: standing, squatting, stairs, transfers, and locomotion level   PARTICIPATION LIMITATIONS: shopping, community activity, and occupation   PERSONAL FACTORS: 1 comorbidity: CP  are also affecting patient's functional outcome.    REHAB POTENTIAL: Good   CLINICAL DECISION MAKING: Stable/uncomplicated   EVALUATION COMPLEXITY: Moderate     GOALS: Goals reviewed with patient? Yes   SHORT TERM GOALS: Target date: 05/01/22 I with initial HEP Baseline: Goal status: MET   LONG TERM GOALS: Target date: 10/14/22   I with final HEP Baseline:  Goal status: progressing with her and caregiver 08/19/22   2.  Improve TUG time to < 12 sec Baseline:  Goal status: met 08/14/22  3.  Improve 5 x STS to <12 Baseline:  Goal status:MET 06/05/22   4.  Increase BERG score to at least 44 Baseline:  Goal status: 45/56 MET   5.  Patient will  ambulate with normalized gait pattern, minimizing abnormal weight shifts and increasing LLE position to more neutral hip rotation. Baseline:  Goal status: ongoing 08/19/22     PLAN:   PT FREQUENCY: 1-2x/week   PT DURATION: 10 weeks   PLANNED INTERVENTIONS: Therapeutic exercises, Therapeutic activity, Neuromuscular re-education, Balance training, Gait training, Patient/Family education,  Self Care, Joint mobilization, Dry Needling, Cryotherapy, Moist heat, Ionotophoresis /ml Dexamethasone, and Manual therapy   PLAN FOR NEXT SESSION: work on hip ER and abduction as well as quad strength 2:46 PM,08/26/22 Stacie Glaze,  PT

## 2022-08-28 ENCOUNTER — Ambulatory Visit: Payer: BC Managed Care – PPO | Admitting: Physical Therapy

## 2022-08-28 ENCOUNTER — Encounter: Payer: Self-pay | Admitting: Physical Therapy

## 2022-08-28 DIAGNOSIS — G800 Spastic quadriplegic cerebral palsy: Secondary | ICD-10-CM | POA: Diagnosis not present

## 2022-08-28 DIAGNOSIS — R2681 Unsteadiness on feet: Secondary | ICD-10-CM

## 2022-08-28 DIAGNOSIS — R262 Difficulty in walking, not elsewhere classified: Secondary | ICD-10-CM

## 2022-08-28 DIAGNOSIS — M6281 Muscle weakness (generalized): Secondary | ICD-10-CM

## 2022-08-28 NOTE — Therapy (Signed)
OUTPATIENT PHYSICAL THERAPY TREATMENT   Patient Name: Kerri Robertson MRN: [id number] DOB:Oct 15, 2000, 22 y.o., female Today's Date: 04/10/2022   END OF SESSION:  PT End of Session - 08/28/22 1400     Visit Number 25    Date for PT Re-Evaluation 10/14/22    PT Start Time 1355    PT Stop Time 1440    PT Time Calculation (min) 45 min    Activity Tolerance Patient tolerated treatment well    Behavior During Therapy WFL for tasks assessed/performed                        Past Medical History:  Diagnosis Date   Allergy     Cerebral palsy ([name])     Stroke [facility])           Past Surgical History:  Procedure Laterality Date   ARM SURGERY        2013   FOOT SURGERY        2010   HAMSTRING LENGTHENING       TIBIA / FIBIA LENGTHENING            Patient Active Problem List    Diagnosis Date Noted   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   Balance problem 05/06/2018   Syncope 05/06/2018   History of cerebrovascular accident 11/18/2017   CP (cerebral palsy), spastic, 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[name]) 06/25/2017   Anxiety state 06/25/2017   Spasticity 06/25/2017   Allergic rhinitis 10/21/2016   Mild intermittent asthma 10/21/2016      PCP: Clayborne Dana, NP   REFERRING PROVIDER: Clayborne Dana, NP   REFERRING DIAG: G80.0 (ICD-10-CM) - CP (cerebral palsy), spastic, 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[name])    THERAPY DIAG:  Spastic quadriplegic cerebral palsy ([name])   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 Rechecking CBC, iron, and vitamin D today. She has been on supplementation (refilling iron for her). Will make additional changes based on labs if indicated. PT referral placed to [facility] at her request. No acute concerns today.      PAIN:  Are you having pain? No   PRECAUTIONS: None   WEIGHT BEARING RESTRICTIONS: No   FALLS:  Has patient fallen in last 6 months? Yes. 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 Hobbies- Runner, broadcasting/film/video, archery, baseball, tennis, dance    PLOF: Needs assistance with ADLs   PATIENT GOALS: Patient would like to improve her flexibility and balance.   NEXT MD VISIT:    OBJECTIVE:    DIAGNOSTIC FINDINGS:  Radiology consult confirms lumbar scoliosis with apex at L2-3, but since patient is asymptomatic, recommend monitoring it  and continue to strengthen.           COGNITION: Overall cognitive status: Within functional limits for tasks assessed                         SENSATION: Not tested     MUSCLE LENGTH: Hamstrings: Right 40 deg; Left 30 deg     POSTURE: left pelvic obliquity, weight shift right, and hips rotated to L with L LE held in IR, increased R WB     LOWER EXTREMITY ROM: Trunk lateraly flexed due to scoliosis, L hip unable to achieve neutral ER, abd limited, B hamstrings tight, L ankle does not achieve neutral DF or inversion. LUE with limited ROM at all joints, in all planes. she wears a splint on her wrist at night.      LOWER EXTREMITY MMT: RLE 4/5, LLE with fluctuating tone throughout, HS very weak, hips also weak-MMT accuracy  questionable due to her tone.   FUNCTIONAL TESTS:  5 times sit to stand: 14.77,  07/03/22 13 seconds Timed up and go (TUG): 13.75 , 07/03/22 = 10 seconds Berg Balance Scale: 48/56   GAIT: Distance walked: clinic distances Assistive device utilized: None Level of assistance: Modified independence Comments: Step-to pattern;Decreased step length - left;Decreased stance time - left;Decreased weight shift to left;Shuffle;Ataxic;Lateral hip instability;Trunk rotated posteriorly on left      TODAY'S TREATMENT:                                                                                                              DATE:  08/28/22 Elliptical x 2 minutes with Mod A Leg press 20# both legs 2x10, then left only 2x10 Nustep level 5 x 6 minutes Gait outside uneven terrain, negotiating curbs Volleyball Passive stretch left LE Bridges Left hip abduction in hooklying red tband Active left LE ER  08/26/22 Nustep level 5 x 5 mintues  on airex ball toss  Volleyball Bosu standing Airex balance beam side stepping and tandem walking Stretching of the left LE Left hip abduction Left hip ER Bridges, bridges with left hip  08/21/22 NuStep L5 x35mins Supine hip abd with band yellow 2x10  Supine ball squeeze 2x10 Ball squeeze with bridge 2x10 Sidelying abd 2x10  Ball squeeze with LAQ 2# 2x10  STS on airex 2x10- minA  20# leg press bilateral 2x10   08/19/22 Nustep level 5x 6 minutes 2.5# LAQ  20# leg press bilateral x 10, then left only with PT holding knee neutral so it does not go into valgus 2.5# SAQ Yellow tband left hip abduction in hooklying Left hip ER in supine Bridge ball b/n knees  08/14/22 Nustep level 5 x 6 minutes Low load very long duration stretches without pain for the left LE all motions and stretches in supine, side lying and prone  Active hip abduction supine with use of sliding board Active hip ER in supine with straight leg Hooklying yellow tband hip abduction left  only bridges  07/24/22 Nustep level 5 x  6 minutes Focus on stretches of the left LE, slow low load long durations stretches to her tolerance Trunk stretches Feet on ball bridges, trunk rotation, isometric abs Bridges Left hip abduction slides  07/22/22 Nustep level 5 x 6 minutes LEg press with ball b/n thighs for proper alignment x 10 with 20#, then left only no weight with manually aligning the left knee no weight 2x7 Supine ball b/n knees bridges Hooklying yellow tband left hip abduction Worked on left hip ER actively Left hip abduction with sliding board PROM/stretches of the HS, piriformis, hip ER, adductors Single leg left leg bridges Prone hip extension needed mod A for this Education with caregiver on the stretches,   07/17/22 Nustep level 5 x 6 minutes Supine PROM left hip ER and adductors Sliding board abduction Supine active hip ER Hooklying clamshells Tried side lying hip abduction and clamshell but very difficult SAQ  3 PATIENT EDUCATION:  Education details: impact spasticity and inflexibility has on gait and patella tracking during ambulation; encouraged increase in stretch; consider botox or other appropriate intervention with MD regarding spasticity (if needed); updates to HEP Person educated: Patient and Caregiver   Education method: Explanation Education comprehension: verbalized understanding   HOME EXERCISE PROGRAM: [email or web address]  Access Code: [email or web address] URL: [email or web address] Date: 07/17/2022 Prepared by: Stacie Glaze  Exercises - Supine Hip Internal and External Rotation  - 1 x daily - 7 x weekly - 2 sets - 10 reps - 3 hold - Supine Hip Abduction on Slider  - 1 x daily - 7 x weekly - 2 sets - 10 reps - 3 hold - Hooklying Clamshell with Resistance  - 1 x daily - 7 x weekly - 2 sets - 10 reps - 3 hold - Hip Abduction and Adduction Caregiver PROM  - 1 x daily - 7 x weekly - 1 sets - 5 reps - 30-60 hold - Caregiver PROM Hip Internal  External Rotation  - 1 x daily - 7 x weekly - 1 sets - 5 reps - 30-60 hold - Supine Short Arc Quad  - 1 x daily - 7 x weekly - 2 sets - 10 reps - 3 hold ASSESSMENT:   CLINICAL IMPRESSION: I feel like she has become more flexible and it has not disturbed her gait and or locomotion, we talked to the caregiver and she texted mom.  She is going to be very active this summer and needs to be able to go to a few camps, we will work on her endurance and fitness level with her safety and balance  OBJECTIVE IMPAIRMENTS: Abnormal gait, decreased balance, decreased coordination, decreased endurance, decreased mobility, difficulty walking, decreased ROM, decreased strength, increased fascial restrictions, increased muscle spasms, impaired flexibility, impaired tone, improper body mechanics, and postural dysfunction.    ACTIVITY LIMITATIONS: standing, squatting, stairs, transfers, and locomotion level   PARTICIPATION LIMITATIONS: shopping, community activity, and occupation   PERSONAL FACTORS: 1 comorbidity: CP  are also affecting patient's functional outcome.    REHAB POTENTIAL: Good   CLINICAL DECISION MAKING: Stable/uncomplicated   EVALUATION COMPLEXITY: Moderate     GOALS: Goals reviewed with patient? Yes   SHORT TERM GOALS: Target date: 05/01/22 I with initial HEP Baseline: Goal status: MET   LONG TERM GOALS: Target date: 10/14/22   I with final HEP Baseline:  Goal status: progressing with her and caregiver 08/19/22   2.  Improve TUG time to < 12 sec Baseline:  Goal status: met 08/14/22  3.  Improve 5 x STS  to <12 Baseline:  Goal status:MET 06/05/22   4.  Increase BERG score to at least 44 Baseline:  Goal status: 45/56 MET   5.  Patient will ambulate with normalized gait pattern, minimizing abnormal weight shifts and increasing LLE position to more neutral hip rotation. Baseline:  Goal status: ongoing 08/28/22     PLAN:   PT FREQUENCY: 1-2x/week   PT DURATION: 10 weeks    PLANNED INTERVENTIONS: Therapeutic exercises, Therapeutic activity, Neuromuscular re-education, Balance training, Gait training, Patient/Family education, Self Care, Joint mobilization, Dry Needling, Cryotherapy, Moist heat, Ionotophoresis /ml Dexamethasone, and Manual therapy   PLAN FOR NEXT SESSION: work on hip ER and abduction as well as quad strength 2:07 PM,08/28/22 Stacie Glaze,  PT

## 2022-09-02 ENCOUNTER — Ambulatory Visit: Payer: BC Managed Care – PPO | Admitting: Physical Therapy

## 2022-09-02 ENCOUNTER — Encounter: Payer: Self-pay | Admitting: Physical Therapy

## 2022-09-02 ENCOUNTER — Ambulatory Visit: Payer: BC Managed Care – PPO | Admitting: Family Medicine

## 2022-09-02 DIAGNOSIS — G800 Spastic quadriplegic cerebral palsy: Secondary | ICD-10-CM | POA: Diagnosis not present

## 2022-09-02 DIAGNOSIS — I69954 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: Secondary | ICD-10-CM

## 2022-09-02 DIAGNOSIS — R2681 Unsteadiness on feet: Secondary | ICD-10-CM

## 2022-09-02 DIAGNOSIS — R262 Difficulty in walking, not elsewhere classified: Secondary | ICD-10-CM

## 2022-09-02 DIAGNOSIS — G801 Spastic diplegic cerebral palsy: Secondary | ICD-10-CM

## 2022-09-02 DIAGNOSIS — M6281 Muscle weakness (generalized): Secondary | ICD-10-CM

## 2022-09-02 NOTE — Therapy (Signed)
OUTPATIENT PHYSICAL THERAPY TREATMENT   Patient Name: Kerri Robertson MRN: [id number] DOB:2001-03-13, 22 y.o., female Today's Date: 04/10/2022   END OF SESSION:  PT End of Session - 09/02/22 1433     Visit Number 26    Date for PT Re-Evaluation 10/14/22    PT Start Time 1433    PT Stop Time 1519    PT Time Calculation (min) 46 min    Activity Tolerance Patient tolerated treatment well    Behavior During Therapy WFL for tasks assessed/performed                        Past Medical History:  Diagnosis Date   Allergy     Cerebral palsy ([name])     Stroke ([name])           Past Surgical History:  Procedure Laterality Date   ARM SURGERY        2013   FOOT SURGERY        2010   HAMSTRING LENGTHENING       TIBIA / FIBIA LENGTHENING            Patient Active Problem List    Diagnosis Date Noted   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   Balance problem 05/06/2018   Syncope 05/06/2018   History of cerebrovascular accident 11/18/2017   CP (cerebral palsy), spastic, 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[name]) 06/25/2017   Anxiety state 06/25/2017   Spasticity 06/25/2017   Allergic rhinitis 10/21/2016   Mild intermittent asthma 10/21/2016      PCP: Clayborne Dana, NP   REFERRING PROVIDER: Clayborne Dana, NP   REFERRING DIAG: G80.0 (ICD-10-CM) - CP (cerebral palsy), spastic, 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[name])    THERAPY DIAG:  Spastic quadriplegic cerebral palsy ([name])   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 Rechecking CBC, iron, and vitamin D today. She has been on supplementation (refilling iron for her). Will make additional changes based on labs if indicated. PT referral placed to [facility] at her request. No acute concerns today.      PAIN:  Are you having pain? No   PRECAUTIONS: None   WEIGHT BEARING RESTRICTIONS: No   FALLS:  Has patient fallen in last 6 months? Yes. 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 Hobbies- Runner, broadcasting/film/video, archery, baseball, tennis, dance    PLOF: Needs assistance with ADLs   PATIENT GOALS: Patient would like to improve her flexibility and balance.   NEXT MD VISIT:    OBJECTIVE:    DIAGNOSTIC FINDINGS:  Radiology consult confirms lumbar scoliosis with apex at L2-3, but since patient  is asymptomatic, recommend monitoring it and continue to strengthen.           COGNITION: Overall cognitive status: Within functional limits for tasks assessed                         SENSATION: Not tested     MUSCLE LENGTH: Hamstrings: Right 40 deg; Left 30 deg     POSTURE: left pelvic obliquity, weight shift right, and hips rotated to L with L LE held in IR, increased R WB     LOWER EXTREMITY ROM: Trunk lateraly flexed due to scoliosis, L hip unable to achieve neutral ER, abd limited, B hamstrings tight, L ankle does not achieve neutral DF or inversion. LUE with limited ROM at all joints, in all planes. she wears a splint on her wrist at night.      LOWER EXTREMITY MMT: RLE 4/5, LLE with fluctuating tone throughout, HS very weak, hips  also weak-MMT accuracy questionable due to her tone.   FUNCTIONAL TESTS:  5 times sit to stand: 14.77,  07/03/22 13 seconds Timed up and go (TUG): 13.75 , 07/03/22 = 10 seconds Berg Balance Scale: 48/56   GAIT: Distance walked: clinic distances Assistive device utilized: None Level of assistance: Modified independence Comments: Step-to pattern;Decreased step length - left;Decreased stance time - left;Decreased weight shift to left;Shuffle;Ataxic;Lateral hip instability;Trunk rotated posteriorly on left      TODAY'S TREATMENT:                                                                                                              DATE:  09/02/22 Nustep level 5 x 6 minutes Gait outside SBA uneven terrain, curbs, grass Volleyball with movements 2.5# on the left leg LAQ, marches and hip abduction LEg press 40# 3x10 Side stepping, backward walking Partial sit ups Passive stretch of the left LE  08/28/22 Elliptical x 2 minutes with Mod A Leg press 20# both legs 2x10, then left only 2x10 Nustep level 5 x 6 minutes Gait outside uneven terrain, negotiating curbs Volleyball Passive stretch left LE Bridges Left hip abduction in hooklying red tband Active left LE ER  08/26/22 Nustep level 5 x 5 mintues  on airex ball toss  Volleyball Bosu standing Airex balance beam side stepping and tandem walking Stretching of the left LE Left hip abduction Left hip ER Bridges, bridges with left hip  08/21/22 NuStep L5 x70mins Supine hip abd with band yellow 2x10  Supine ball squeeze 2x10 Ball squeeze with bridge 2x10 Sidelying abd 2x10  Ball squeeze with LAQ 2# 2x10  STS on airex 2x10- minA  20# leg press bilateral 2x10   08/19/22 Nustep level 5x 6 minutes 2.5# LAQ  20# leg press bilateral x 10, then left only with PT holding knee neutral so it does not go into valgus 2.5# SAQ Yellow tband left hip abduction in hooklying Left hip ER in supine Bridge ball b/n  knees  08/14/22 Nustep level 5 x 6 minutes Low load very long  duration stretches without pain for the left LE all motions and stretches in supine, side lying and prone  Active hip abduction supine with use of sliding board Active hip ER in supine with straight leg Hooklying yellow tband hip abduction left only bridges  07/24/22 Nustep level 5 x 6 minutes Focus on stretches of the left LE, slow low load long durations stretches to her tolerance Trunk stretches Feet on ball bridges, trunk rotation, isometric abs Bridges Left hip abduction slides  07/22/22 Nustep level 5 x 6 minutes LEg press with ball b/n thighs for proper alignment x 10 with 20#, then left only no weight with manually aligning the left knee no weight 2x7 Supine ball b/n knees bridges Hooklying yellow tband left hip abduction Worked on left hip ER actively Left hip abduction with sliding board PROM/stretches of the HS, piriformis, hip ER, adductors Single leg left leg bridges Prone hip extension needed mod A for this Education with caregiver on the stretches,   07/17/22 Nustep level 5 x 6 minutes Supine PROM left hip ER and adductors Sliding board abduction Supine active hip ER Hooklying clamshells Tried side lying hip abduction and clamshell but very difficult SAQ  3 PATIENT EDUCATION:  Education details: impact spasticity and inflexibility has on gait and patella tracking during ambulation; encouraged increase in stretch; consider botox or other appropriate intervention with MD regarding spasticity (if needed); updates to HEP Person educated: Patient and Caregiver   Education method: Explanation Education comprehension: verbalized understanding   HOME EXERCISE PROGRAM: [email or web address]  Access Code: [email or web address] URL: [email or web address] Date: 07/17/2022 Prepared by: Stacie Glaze  Exercises - Supine Hip Internal and External Rotation  - 1 x daily - 7 x weekly - 2 sets - 10 reps - 3 hold -  Supine Hip Abduction on Slider  - 1 x daily - 7 x weekly - 2 sets - 10 reps - 3 hold - Hooklying Clamshell with Resistance  - 1 x daily - 7 x weekly - 2 sets - 10 reps - 3 hold - Hip Abduction and Adduction Caregiver PROM  - 1 x daily - 7 x weekly - 1 sets - 5 reps - 30-60 hold - Caregiver PROM Hip Internal External Rotation  - 1 x daily - 7 x weekly - 1 sets - 5 reps - 30-60 hold - Supine Short Arc Quad  - 1 x daily - 7 x weekly - 2 sets - 10 reps - 3 hold ASSESSMENT:   CLINICAL IMPRESSION: Patient had a good gait outside around the parking [place], never out of control and had good left foot placement with less IR of the hip, I continue to stretch the LE and it seems to be moving a little better, the HS is tight and limits LAQ's  OBJECTIVE IMPAIRMENTS: Abnormal gait, decreased balance, decreased coordination, decreased endurance, decreased mobility, difficulty walking, decreased ROM, decreased strength, increased fascial restrictions, increased muscle spasms, impaired flexibility, impaired tone, improper body mechanics, and postural dysfunction.    ACTIVITY LIMITATIONS: standing, squatting, stairs, transfers, and locomotion level   PARTICIPATION LIMITATIONS: shopping, community activity, and occupation   PERSONAL FACTORS: 1 comorbidity: CP  are also affecting patient's functional outcome.    REHAB POTENTIAL: Good   CLINICAL DECISION MAKING: Stable/uncomplicated   EVALUATION COMPLEXITY: Moderate     GOALS: Goals reviewed with patient? Yes   SHORT TERM GOALS: Target date: 05/01/22 I with initial HEP Baseline: Goal status: MET   LONG TERM GOALS: Target date: 10/14/22   I with  final HEP Baseline:  Goal status: progressing with her and caregiver 08/19/22   2.  Improve TUG time to < 12 sec Baseline:  Goal status: met 08/14/22  3.  Improve 5 x STS to <12 Baseline:  Goal status:MET 06/05/22   4.  Increase BERG score to at least 44 Baseline:  Goal status: 45/56 MET   5.  Patient  will ambulate with normalized gait pattern, minimizing abnormal weight shifts and increasing LLE position to more neutral hip rotation. Baseline:  Goal status: ongoing 08/28/22     PLAN:   PT FREQUENCY: 1-2x/week   PT DURATION: 10 weeks   PLANNED INTERVENTIONS: Therapeutic exercises, Therapeutic activity, Neuromuscular re-education, Balance training, Gait training, Patient/Family education, Self Care, Joint mobilization, Dry Needling, Cryotherapy, Moist heat, Ionotophoresis 4mg /ml Dexamethasone, and Manual therapy   PLAN FOR NEXT SESSION: work on hip ER and abduction as well as quad strength 2:33 PM,09/02/22 Stacie Glaze,  PT

## 2022-09-04 ENCOUNTER — Encounter: Payer: Self-pay | Admitting: Physical Therapy

## 2022-09-04 ENCOUNTER — Ambulatory Visit: Payer: BC Managed Care – PPO | Attending: Neurology | Admitting: Physical Therapy

## 2022-09-04 DIAGNOSIS — R2689 Other abnormalities of gait and mobility: Secondary | ICD-10-CM | POA: Insufficient documentation

## 2022-09-04 DIAGNOSIS — R531 Weakness: Secondary | ICD-10-CM | POA: Insufficient documentation

## 2022-09-04 DIAGNOSIS — R262 Difficulty in walking, not elsewhere classified: Secondary | ICD-10-CM

## 2022-09-04 DIAGNOSIS — R293 Abnormal posture: Secondary | ICD-10-CM | POA: Insufficient documentation

## 2022-09-04 DIAGNOSIS — M6281 Muscle weakness (generalized): Secondary | ICD-10-CM | POA: Diagnosis present

## 2022-09-04 DIAGNOSIS — G800 Spastic quadriplegic cerebral palsy: Secondary | ICD-10-CM | POA: Diagnosis present

## 2022-09-04 DIAGNOSIS — R2681 Unsteadiness on feet: Secondary | ICD-10-CM | POA: Diagnosis present

## 2022-09-04 DIAGNOSIS — R278 Other lack of coordination: Secondary | ICD-10-CM | POA: Insufficient documentation

## 2022-09-04 DIAGNOSIS — I69954 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: Secondary | ICD-10-CM | POA: Insufficient documentation

## 2022-09-04 DIAGNOSIS — G801 Spastic diplegic cerebral palsy: Secondary | ICD-10-CM | POA: Diagnosis present

## 2022-09-04 NOTE — Therapy (Signed)
OUTPATIENT PHYSICAL THERAPY TREATMENT   Patient Name: Kerri Robertson MRN: [id number] DOB:Sep 28, 2000, 22 y.o., female Today's Date: 04/10/2022   END OF SESSION:  PT End of Session - 09/04/22 1445     Visit Number 27    Date for PT Re-Evaluation 10/14/22    PT Start Time 1442    PT Stop Time 1530    PT Time Calculation (min) 48 min    Activity Tolerance Patient tolerated treatment well    Behavior During Therapy WFL for tasks assessed/performed                        Past Medical History:  Diagnosis Date   Allergy     Cerebral palsy ([name])     Stroke [facility])           Past Surgical History:  Procedure Laterality Date   ARM SURGERY        2013   FOOT SURGERY        2010   HAMSTRING LENGTHENING       TIBIA / FIBIA LENGTHENING            Patient Active Problem List    Diagnosis Date Noted   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   Balance problem 05/06/2018   Syncope 05/06/2018   History of cerebrovascular accident 11/18/2017   CP (cerebral palsy), spastic, 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[name]) 06/25/2017   Anxiety state 06/25/2017   Spasticity 06/25/2017   Allergic rhinitis 10/21/2016   Mild intermittent asthma 10/21/2016      PCP: Clayborne Dana, NP   REFERRING PROVIDER: Clayborne Dana, NP   REFERRING DIAG: G80.0 (ICD-10-CM) - CP (cerebral palsy), spastic, 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[name])    THERAPY DIAG:  Spastic quadriplegic cerebral palsy ([name])   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)   Other lack of coordination   Unsteadiness on feet   Difficulty in walking, not elsewhere classified   Left-sided weakness   Muscle weakness (generalized)   Abnormal posture   Cerebral palsy with spastic diplegia ([name])   Rationale for Evaluation and Treatment: Rehabilitation   ONSET DATE: 03/17/22   SUBJECTIVE:    SUBJECTIVE STATEMENT: Patient's mom called  and we talked during the treatment time about continuation of PT, she feels that [facility] is walking much better and has had fewer stumbles, she would like to see her continue until she goes to camp  PERTINENT HISTORY:     CP (cerebral palsy), spastic, 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[name]) - Primary         She is stable, at baseline (ambulatory, but with with some gait abnormalities, balance issues, and weakness). Rechecking CBC, iron, and vitamin D today. She has been on supplementation (refilling iron for her). Will make additional changes based on labs if indicated. PT referral placed to [facility] at her request. No acute concerns today.      PAIN:  Are you having pain? No   PRECAUTIONS: None   WEIGHT BEARING RESTRICTIONS: No   FALLS:  Has patient fallen in last 6 months? Yes. 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 Hobbies- Runner, broadcasting/film/video, archery, baseball, tennis, dance    PLOF: Needs assistance with ADLs   PATIENT GOALS: Patient would like to improve  her flexibility and balance.   NEXT MD VISIT:    OBJECTIVE:    DIAGNOSTIC FINDINGS:  Radiology consult confirms lumbar scoliosis with apex at L2-3, but since patient is asymptomatic, recommend monitoring it and continue to strengthen.           COGNITION: Overall cognitive status: Within functional limits for tasks assessed                         SENSATION: Not tested     MUSCLE LENGTH: Hamstrings: Right 40 deg; Left 30 deg     POSTURE: left pelvic obliquity, weight shift right, and hips rotated to L with L LE held in IR, increased R WB     LOWER EXTREMITY ROM: Trunk lateraly flexed due to scoliosis, L hip unable to achieve neutral ER, abd limited, B hamstrings tight, L ankle does not achieve neutral DF or inversion. LUE with limited ROM at  all joints, in all planes. she wears a splint on her wrist at night.      LOWER EXTREMITY MMT: RLE 4/5, LLE with fluctuating tone throughout, HS very weak, hips also weak-MMT accuracy questionable due to her tone.   FUNCTIONAL TESTS:  5 times sit to stand: 14.77,  07/03/22 13 seconds Timed up and go (TUG): 13.75 , 07/03/22 = 10 seconds Berg Balance Scale: 48/56   GAIT: Distance walked: clinic distances Assistive device utilized: None Level of assistance: Modified independence Comments: Step-to pattern;Decreased step length - left;Decreased stance time - left;Decreased weight shift to left;Shuffle;Ataxic;Lateral hip instability;Trunk rotated posteriorly on left      TODAY'S TREATMENT:                                                                                                              DATE:  09/04/22 Nustep level 5 x 6 minutes Gait outside uneven terrain, grass, slope and curbs On foam mat working on walking, bending and lifting, cone toe touches, figure 8's and then getting up from the floor Passive stretch left hip Active hip abduction in supine, use of sliding board with towel and 2# on the towel  09/02/22 Nustep level 5 x 6 minutes Gait outside SBA uneven terrain, curbs, grass Volleyball with movements 2.5# on the left leg LAQ, marches and hip abduction LEg press 40# 3x10 Side stepping, backward walking Partial sit ups Passive stretch of the left LE  08/28/22 Elliptical x 2 minutes with Mod A Leg press 20# both legs 2x10, then left only 2x10 Nustep level 5 x 6 minutes Gait outside uneven terrain, negotiating curbs Volleyball Passive stretch left LE Bridges Left hip abduction in hooklying red tband Active left LE ER  08/26/22 Nustep level 5 x 5 mintues  on airex ball toss  Volleyball Bosu standing Airex balance beam side stepping and tandem walking Stretching of the left LE Left hip abduction Left hip ER Bridges, bridges with left hip  08/21/22 NuStep L5  x58mins Supine hip abd with band yellow 2x10  Supine ball squeeze 2x10 [place] [employer]  squeeze with bridge 2x10 Sidelying abd 2x10  Ball squeeze with LAQ 2# 2x10  STS on airex 2x10- minA  20# leg press bilateral 2x10   08/19/22 Nustep level 5x 6 minutes 2.5# LAQ  20# leg press bilateral x 10, then left only with PT holding knee neutral so it does not go into valgus 2.5# SAQ Yellow tband left hip abduction in hooklying Left hip ER in supine Bridge ball b/n knees  08/14/22 Nustep level 5 x 6 minutes Low load very long duration stretches without pain for the left LE all motions and stretches in supine, side lying and prone  Active hip abduction supine with use of sliding board Active hip ER in supine with straight leg Hooklying yellow tband hip abduction left only bridges  07/24/22 Nustep level 5 x 6 minutes Focus on stretches of the left LE, slow low load long durations stretches to her tolerance Trunk stretches Feet on ball bridges, trunk rotation, isometric abs Bridges Left hip abduction slides  07/22/22 Nustep level 5 x 6 minutes LEg press with ball b/n thighs for proper alignment x 10 with 20#, then left only no weight with manually aligning the left knee no weight 2x7 Supine ball b/n knees bridges Hooklying yellow tband left hip abduction Worked on left hip ER actively Left hip abduction with sliding board PROM/stretches of the HS, piriformis, hip ER, adductors Single leg left leg bridges Prone hip extension needed mod A for this Education with caregiver on the stretches,   07/17/22 Nustep level 5 x 6 minutes Supine PROM left hip ER and adductors Sliding board abduction Supine active hip ER Hooklying clamshells Tried side lying hip abduction and clamshell but very difficult SAQ  3 PATIENT EDUCATION:  Education details: impact spasticity and inflexibility has on gait and patella tracking during ambulation; encouraged increase in stretch; consider botox or other appropriate  intervention with MD regarding spasticity (if needed); updates to HEP Person educated: Patient and Caregiver   Education method: Explanation Education comprehension: verbalized understanding   HOME EXERCISE PROGRAM: [email or web address]  Access Code: [email or web address] URL: [email or web address] Date: 07/17/2022 Prepared by: Stacie Glaze  Exercises - Supine Hip Internal and External Rotation  - 1 x daily - 7 x weekly - 2 sets - 10 reps - 3 hold - Supine Hip Abduction on Slider  - 1 x daily - 7 x weekly - 2 sets - 10 reps - 3 hold - Hooklying Clamshell with Resistance  - 1 x daily - 7 x weekly - 2 sets - 10 reps - 3 hold - Hip Abduction and Adduction Caregiver PROM  - 1 x daily - 7 x weekly - 1 sets - 5 reps - 30-60 hold - Caregiver PROM Hip Internal External Rotation  - 1 x daily - 7 x weekly - 1 sets - 5 reps - 30-60 hold - Supine Short Arc Quad  - 1 x daily - 7 x weekly - 2 sets - 10 reps - 3 hold ASSESSMENT:   CLINICAL IMPRESSION: Patient continue to do well, mom really feels that she is making progress with less effort walking and less rest when going out as well as less stumbles and out of control gait.  She does struggle on the dynamic surfaces  OBJECTIVE IMPAIRMENTS: Abnormal gait, decreased balance, decreased coordination, decreased endurance, decreased mobility, difficulty walking, decreased ROM, decreased strength, increased fascial restrictions, increased muscle spasms, impaired flexibility, impaired tone, improper body mechanics, and postural dysfunction.    ACTIVITY LIMITATIONS: standing, squatting, stairs, transfers,  and locomotion level   PARTICIPATION LIMITATIONS: shopping, community activity, and occupation   PERSONAL FACTORS: 1 comorbidity: CP  are also affecting patient's functional outcome.    REHAB POTENTIAL: Good   CLINICAL DECISION MAKING: Stable/uncomplicated   EVALUATION COMPLEXITY: Moderate     GOALS: Goals reviewed with patient? Yes   SHORT TERM GOALS:  Target date: 05/01/22 I with initial HEP Baseline: Goal status: MET   LONG TERM GOALS: Target date: 10/14/22   I with final HEP Baseline:  Goal status: progressing with her and caregiver 08/19/22   2.  Improve TUG time to < 12 sec Baseline:  Goal status: met 08/14/22  3.  Improve 5 x STS to <12 Baseline:  Goal status:MET 06/05/22   4.  Increase BERG score to at least 44 Baseline:  Goal status: 45/56 MET   5.  Patient will ambulate with normalized gait pattern, minimizing abnormal weight shifts and increasing LLE position to more neutral hip rotation. Baseline:  Goal status: ongoing 08/28/22     PLAN:   PT FREQUENCY: 1-2x/week   PT DURATION: 10 weeks   PLANNED INTERVENTIONS: Therapeutic exercises, Therapeutic activity, Neuromuscular re-education, Balance training, Gait training, Patient/Family education, Self Care, Joint mobilization, Dry Needling, Cryotherapy, Moist heat, Ionotophoresis 4mg /ml Dexamethasone, and Manual therapy   PLAN FOR NEXT SESSION: work on hip ER and abduction as well as quad strength 2:46 PM,09/04/22 Stacie Glaze,  PT

## 2022-09-11 ENCOUNTER — Encounter: Payer: Self-pay | Admitting: Physical Therapy

## 2022-09-11 ENCOUNTER — Ambulatory Visit: Payer: BC Managed Care – PPO | Admitting: Physical Therapy

## 2022-09-11 DIAGNOSIS — G800 Spastic quadriplegic cerebral palsy: Secondary | ICD-10-CM

## 2022-09-11 DIAGNOSIS — R262 Difficulty in walking, not elsewhere classified: Secondary | ICD-10-CM

## 2022-09-11 DIAGNOSIS — M6281 Muscle weakness (generalized): Secondary | ICD-10-CM

## 2022-09-11 DIAGNOSIS — R2681 Unsteadiness on feet: Secondary | ICD-10-CM

## 2022-09-11 NOTE — Therapy (Signed)
OUTPATIENT PHYSICAL THERAPY TREATMENT   Patient Name: Seleena Delprado MRN: [id number] DOB:2001-01-11, 22 y.o., female Today's Date: 04/10/2022   END OF SESSION:  PT End of Session - 09/11/22 1451     Visit Number 28    Date for PT Re-Evaluation 10/14/22    PT Start Time 1445    PT Stop Time 1529    PT Time Calculation (min) 44 min    Activity Tolerance Patient tolerated treatment well    Behavior During Therapy WFL for tasks assessed/performed                        Past Medical History:  Diagnosis Date   Allergy     Cerebral palsy ([name])     Stroke ([name])           Past Surgical History:  Procedure Laterality Date   ARM SURGERY        2013   FOOT SURGERY        2010   HAMSTRING LENGTHENING       TIBIA / FIBIA LENGTHENING            Patient Active Problem List    Diagnosis Date Noted   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   Balance problem 05/06/2018   Syncope 05/06/2018   History of cerebrovascular accident 11/18/2017   CP (cerebral palsy), spastic, 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[name]) 06/25/2017   Anxiety state 06/25/2017   Spasticity 06/25/2017   Allergic rhinitis 10/21/2016   Mild intermittent asthma 10/21/2016      PCP: Clayborne Dana, NP   REFERRING PROVIDER: Clayborne Dana, NP   REFERRING DIAG: G80.0 (ICD-10-CM) - CP (cerebral palsy), spastic, 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[name])    THERAPY DIAG:  Spastic quadriplegic cerebral palsy ([name])   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  No injuries reported  PERTINENT HISTORY:     CP (cerebral palsy), spastic, 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[name]) - Primary         She is stable, at baseline (ambulatory, but with with some gait abnormalities, balance issues, and weakness). Rechecking CBC, iron, and vitamin D today. She has been on supplementation (refilling iron for her). Will make additional changes based on labs if indicated. PT referral placed to [facility] at her request. No acute concerns today.      PAIN:  Are you having pain? No   PRECAUTIONS: None   WEIGHT BEARING RESTRICTIONS: No   FALLS:  Has patient fallen in last 6 months? Yes. 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 Hobbies- Runner, broadcasting/film/video, archery, baseball, tennis, dance    PLOF: Needs assistance with ADLs   PATIENT GOALS: Patient would like to improve her flexibility and balance.   NEXT MD VISIT:  OBJECTIVE:    DIAGNOSTIC FINDINGS:  Radiology consult confirms lumbar scoliosis with apex at L2-3, but since patient is asymptomatic, recommend monitoring it and continue to strengthen.           COGNITION: Overall cognitive status: Within functional limits for tasks assessed                         SENSATION: Not tested     MUSCLE LENGTH: Hamstrings: Right 40 deg; Left 30 deg     POSTURE: left pelvic obliquity, weight shift right, and hips rotated to L with L LE held in IR, increased R WB     LOWER EXTREMITY ROM: Trunk lateraly flexed due to scoliosis, L hip unable to achieve neutral ER, abd limited, B hamstrings tight, L ankle does not achieve neutral DF or inversion. LUE with limited ROM at all joints, in all planes. she wears a splint on her wrist at  night.      LOWER EXTREMITY MMT: RLE 4/5, LLE with fluctuating tone throughout, HS very weak, hips also weak-MMT accuracy questionable due to her tone.   FUNCTIONAL TESTS:  5 times sit to stand: 14.77,  07/03/22 13 seconds Timed up and go (TUG): 13.75 , 07/03/22 = 10 seconds Berg Balance Scale: 48/56   GAIT: Distance walked: clinic distances Assistive device utilized: None Level of assistance: Modified independence Comments: Step-to pattern;Decreased step length - left;Decreased stance time - left;Decreased weight shift to left;Shuffle;Ataxic;Lateral hip instability;Trunk rotated posteriorly on left      TODAY'S TREATMENT:                                                                                                              DATE:  09/11/22 Bike level 3 x 6 minutes Airex balance beam side stepping, tandem walking Step and reach with turn Side step on and off 2" step Volley ball, volley ball on airex Left LE stretch  09/04/22 Nustep level 5 x 6 minutes Gait outside uneven terrain, grass, slope and curbs On foam mat working on walking, bending and lifting, cone toe touches, figure 8's and then getting up from the floor Passive stretch left hip Active hip abduction in supine, use of sliding board with towel and 2# on the towel  09/02/22 Nustep level 5 x 6 minutes Gait outside SBA uneven terrain, curbs, grass Volleyball with movements 2.5# on the left leg LAQ, marches and hip abduction LEg press 40# 3x10 Side stepping, backward walking Partial sit ups Passive stretch of the left LE  08/28/22 Elliptical x 2 minutes with Mod A Leg press 20# both legs 2x10, then left only 2x10 Nustep level 5 x 6 minutes Gait outside uneven terrain, negotiating curbs Volleyball Passive stretch left LE Bridges Left hip abduction in hooklying red tband Active left LE ER  08/26/22 Nustep level 5 x 5 mintues  on airex ball toss  Volleyball Bosu standing Airex balance beam side stepping  and tandem walking Stretching of the left LE Left hip abduction Left hip  ER Bridges, bridges with left hip  08/21/22 NuStep L5 x48mins Supine hip abd with band yellow 2x10  Supine ball squeeze 2x10 Ball squeeze with bridge 2x10 Sidelying abd 2x10  Ball squeeze with LAQ 2# 2x10  STS on airex 2x10- minA  20# leg press bilateral 2x10   08/19/22 Nustep level 5x 6 minutes 2.5# LAQ  20# leg press bilateral x 10, then left only with PT holding knee neutral so it does not go into valgus 2.5# SAQ Yellow tband left hip abduction in hooklying Left hip ER in supine Bridge ball b/n knees  08/14/22 Nustep level 5 x 6 minutes Low load very long duration stretches without pain for the left LE all motions and stretches in supine, side lying and prone  Active hip abduction supine with use of sliding board Active hip ER in supine with straight leg Hooklying yellow tband hip abduction left only bridges  07/24/22 Nustep level 5 x 6 minutes Focus on stretches of the left LE, slow low load long durations stretches to her tolerance Trunk stretches Feet on ball bridges, trunk rotation, isometric abs Bridges Left hip abduction slides  07/22/22 Nustep level 5 x 6 minutes LEg press with ball b/n thighs for proper alignment x 10 with 20#, then left only no weight with manually aligning the left knee no weight 2x7 Supine ball b/n knees bridges Hooklying yellow tband left hip abduction Worked on left hip ER actively Left hip abduction with sliding board PROM/stretches of the HS, piriformis, hip ER, adductors Single leg left leg bridges Prone hip extension needed mod A for this Education with caregiver on the stretches,   07/17/22 Nustep level 5 x 6 minutes Supine PROM left hip ER and adductors Sliding board abduction Supine active hip ER Hooklying clamshells Tried side lying hip abduction and clamshell but very difficult SAQ  3 PATIENT EDUCATION:  Education details: impact spasticity and  inflexibility has on gait and patella tracking during ambulation; encouraged increase in stretch; consider botox or other appropriate intervention with MD regarding spasticity (if needed); updates to HEP Person educated: Patient and Caregiver   Education method: Explanation Education comprehension: verbalized understanding   HOME EXERCISE PROGRAM: [email or web address]  Access Code: [email or web address] URL: [email or web address] Date: 07/17/2022 Prepared by: Stacie Glaze  Exercises - Supine Hip Internal and External Rotation  - 1 x daily - 7 x weekly - 2 sets - 10 reps - 3 hold - Supine Hip Abduction on Slider  - 1 x daily - 7 x weekly - 2 sets - 10 reps - 3 hold - Hooklying Clamshell with Resistance  - 1 x daily - 7 x weekly - 2 sets - 10 reps - 3 hold - Hip Abduction and Adduction Caregiver PROM  - 1 x daily - 7 x weekly - 1 sets - 5 reps - 30-60 hold - Caregiver PROM Hip Internal External Rotation  - 1 x daily - 7 x weekly - 1 sets - 5 reps - 30-60 hold - Supine Short Arc Quad  - 1 x daily - 7 x weekly - 2 sets - 10 reps - 3 hold ASSESSMENT:   CLINICAL IMPRESSION: Patient had a fall when a door was closing on her, denies injury, we worked a little more on balance today and finished with the stretch.  She did very well with the balance, when she does lose her balance she is able to do the righting steps OBJECTIVE IMPAIRMENTS: Abnormal gait, decreased balance, decreased coordination, decreased endurance, decreased mobility, difficulty  walking, decreased ROM, decreased strength, increased fascial restrictions, increased muscle spasms, impaired flexibility, impaired tone, improper body mechanics, and postural dysfunction.    ACTIVITY LIMITATIONS: standing, squatting, stairs, transfers, and locomotion level   PARTICIPATION LIMITATIONS: shopping, community activity, and occupation   PERSONAL FACTORS: 1 comorbidity: CP  are also affecting patient's functional outcome.    REHAB POTENTIAL: Good    CLINICAL DECISION MAKING: Stable/uncomplicated   EVALUATION COMPLEXITY: Moderate     GOALS: Goals reviewed with patient? Yes   SHORT TERM GOALS: Target date: 05/01/22 I with initial HEP Baseline: Goal status: MET   LONG TERM GOALS: Target date: 10/14/22   I with final HEP Baseline:  Goal status: progressing with her and caregiver 08/19/22   2.  Improve TUG time to < 12 sec Baseline:  Goal status: met 08/14/22  3.  Improve 5 x STS to <12 Baseline:  Goal status:MET 06/05/22   4.  Increase BERG score to at least 44 Baseline:  Goal status: 45/56 MET   5.  Patient will ambulate with normalized gait pattern, minimizing abnormal weight shifts and increasing LLE position to more neutral hip rotation. Baseline:  Goal status: ongoing 08/28/22     PLAN:   PT FREQUENCY: 1-2x/week   PT DURATION: 10 weeks   PLANNED INTERVENTIONS: Therapeutic exercises, Therapeutic activity, Neuromuscular re-education, Balance training, Gait training, Patient/Family education, Self Care, Joint mobilization, Dry Needling, Cryotherapy, Moist heat, Ionotophoresis 4mg /ml Dexamethasone, and Manual therapy   PLAN FOR NEXT SESSION: work on hip ER and abduction as well as quad strength add more balance 2:51 PM,09/11/22 Stacie Glaze,  PT

## 2022-09-16 ENCOUNTER — Ambulatory Visit: Payer: BC Managed Care – PPO

## 2022-09-16 DIAGNOSIS — I69954 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: Secondary | ICD-10-CM

## 2022-09-16 DIAGNOSIS — G800 Spastic quadriplegic cerebral palsy: Secondary | ICD-10-CM

## 2022-09-16 DIAGNOSIS — R2689 Other abnormalities of gait and mobility: Secondary | ICD-10-CM

## 2022-09-16 DIAGNOSIS — G801 Spastic diplegic cerebral palsy: Secondary | ICD-10-CM

## 2022-09-16 DIAGNOSIS — R293 Abnormal posture: Secondary | ICD-10-CM

## 2022-09-16 DIAGNOSIS — R262 Difficulty in walking, not elsewhere classified: Secondary | ICD-10-CM

## 2022-09-16 DIAGNOSIS — R2681 Unsteadiness on feet: Secondary | ICD-10-CM

## 2022-09-16 DIAGNOSIS — R531 Weakness: Secondary | ICD-10-CM

## 2022-09-16 DIAGNOSIS — M6281 Muscle weakness (generalized): Secondary | ICD-10-CM

## 2022-09-16 DIAGNOSIS — R278 Other lack of coordination: Secondary | ICD-10-CM

## 2022-09-16 NOTE — Therapy (Signed)
OUTPATIENT PHYSICAL THERAPY TREATMENT   Patient Name: Kerri Robertson MRN: [id number] DOB:2000/12/28, 22 y.o., female Today's Date: 04/10/2022   END OF SESSION:  PT End of Session - 09/16/22 1546     Visit Number 29    Date for PT Re-Evaluation 10/14/22    PT Start Time 1545    PT Stop Time 1630    PT Time Calculation (min) 45 min    Activity Tolerance Patient tolerated treatment well    Behavior During Therapy WFL for tasks assessed/performed                         Past Medical History:  Diagnosis Date   Allergy     Cerebral palsy ([name])     Stroke ([name])           Past Surgical History:  Procedure Laterality Date   ARM SURGERY        2013   FOOT SURGERY        2010   HAMSTRING LENGTHENING       TIBIA / FIBIA LENGTHENING            Patient Active Problem List    Diagnosis Date Noted   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   Balance problem 05/06/2018   Syncope 05/06/2018   History of cerebrovascular accident 11/18/2017   CP (cerebral palsy), spastic, 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[name]) 06/25/2017   Anxiety state 06/25/2017   Spasticity 06/25/2017   Allergic rhinitis 10/21/2016   Mild intermittent asthma 10/21/2016      PCP: Clayborne Dana, NP   REFERRING PROVIDER: Clayborne Dana, NP   REFERRING DIAG: G80.0 (ICD-10-CM) - CP (cerebral palsy), spastic, 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[name])    THERAPY DIAG:  Spastic quadriplegic cerebral palsy ([name])   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 She has been miss stepping when walking.   PERTINENT HISTORY:     CP (cerebral palsy), spastic, 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[name]) - Primary         She is stable, at baseline (ambulatory, but with with some gait abnormalities, balance issues, and weakness). Rechecking CBC, iron, and vitamin D today. She has been on supplementation (refilling iron for her). Will make additional changes based on labs if indicated. PT referral placed to [facility] at her request. No acute concerns today.      PAIN:  Are you having pain? No   PRECAUTIONS: None   WEIGHT BEARING RESTRICTIONS: No   FALLS:  Has patient fallen in last 6 months? Yes. 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 Hobbies- Runner, broadcasting/film/video, archery, baseball, tennis, dance    PLOF: Needs assistance with ADLs   PATIENT GOALS: Patient would like to improve her flexibility and balance.   NEXT MD VISIT:    OBJECTIVE:    DIAGNOSTIC FINDINGS:  Radiology consult confirms lumbar  scoliosis with apex at L2-3, but since patient is asymptomatic, recommend monitoring it and continue to strengthen.           COGNITION: Overall cognitive status: Within functional limits for tasks assessed                         SENSATION: Not tested     MUSCLE LENGTH: Hamstrings: Right 40 deg; Left 30 deg     POSTURE: left pelvic obliquity, weight shift right, and hips rotated to L with L LE held in IR, increased R WB     LOWER EXTREMITY ROM: Trunk lateraly flexed due to scoliosis, L hip unable to achieve neutral ER, abd limited, B hamstrings tight, L ankle does not achieve neutral DF or inversion. LUE with limited ROM at all joints, in all planes. she wears a splint on her wrist at night.      LOWER EXTREMITY MMT: RLE 4/5, LLE with fluctuating tone  throughout, HS very weak, hips also weak-MMT accuracy questionable due to her tone.   FUNCTIONAL TESTS:  5 times sit to stand: 14.77,  07/03/22 13 seconds Timed up and go (TUG): 13.75 , 07/03/22 = 10 seconds Berg Balance Scale: 48/56   GAIT: Distance walked: clinic distances Assistive device utilized: None Level of assistance: Modified independence Comments: Step-to pattern;Decreased step length - left;Decreased stance time - left;Decreased weight shift to left;Shuffle;Ataxic;Lateral hip instability;Trunk rotated posteriorly on left      TODAY'S TREATMENT:                                                                                                              DATE:  09/16/22 NuStep L5 x25mins  Bike L3 x10mins  Side steps on airex x10 Standing on airex feet together 30s, then reaching outside BOS  Walking beam in parallel bars Passive stretching of LE  Heel slides and hip abd 2x10 LLE    09/11/22 Bike level 3 x 6 minutes Airex balance beam side stepping, tandem walking Step and reach with turn Side step on and off 2" step Volley ball, volley ball on airex Left LE stretch  09/04/22 Nustep level 5 x 6 minutes Gait outside uneven terrain, grass, slope and curbs On foam mat working on walking, bending and lifting, cone toe touches, figure 8's and then getting up from the floor Passive stretch left hip Active hip abduction in supine, use of sliding board with towel and 2# on the towel  09/02/22 Nustep level 5 x 6 minutes Gait outside SBA uneven terrain, curbs, grass Volleyball with movements 2.5# on the left leg LAQ, marches and hip abduction LEg press 40# 3x10 Side stepping, backward walking Partial sit ups Passive stretch of the left LE  08/28/22 Elliptical x 2 minutes with Mod A Leg press 20# both legs 2x10, then left only 2x10 Nustep level 5 x 6 minutes Gait outside uneven terrain, negotiating curbs Volleyball Passive stretch left LE Bridges Left hip abduction in  hooklying red tband Active left LE ER  08/26/22 Nustep level 5 x 5 mintues  on airex ball toss  Volleyball Bosu standing Airex balance beam side stepping and tandem walking Stretching of the left LE Left hip abduction Left hip ER Bridges, bridges with left hip  08/21/22 NuStep L5 x74mins Supine hip abd with band yellow 2x10  Supine ball squeeze 2x10 Ball squeeze with bridge 2x10 Sidelying abd 2x10  Ball squeeze with LAQ 2# 2x10  STS on airex 2x10- minA  20# leg press bilateral 2x10   08/19/22 Nustep level 5x 6 minutes 2.5# LAQ  20# leg press bilateral x 10, then left only with PT holding knee neutral so it does not go into valgus 2.5# SAQ Yellow tband left hip abduction in hooklying Left hip ER in supine Bridge ball b/n knees  08/14/22 Nustep level 5 x 6 minutes Low load very long duration stretches without pain for the left LE all motions and stretches in supine, side lying and prone  Active hip abduction supine with use of sliding board Active hip ER in supine with straight leg Hooklying yellow tband hip abduction left only bridges  07/24/22 Nustep level 5 x 6 minutes Focus on stretches of the left LE, slow low load long durations stretches to her tolerance Trunk stretches Feet on ball bridges, trunk rotation, isometric abs Bridges Left hip abduction slides  07/22/22 Nustep level 5 x 6 minutes LEg press with ball b/n thighs for proper alignment x 10 with 20#, then left only no weight with manually aligning the left knee no weight 2x7 Supine ball b/n knees bridges Hooklying yellow tband left hip abduction Worked on left hip ER actively Left hip abduction with sliding board PROM/stretches of the HS, piriformis, hip ER, adductors Single leg left leg bridges Prone hip extension needed mod A for this Education with caregiver on the stretches,   07/17/22 Nustep level 5 x 6 minutes Supine PROM left hip ER and adductors Sliding board abduction Supine active hip  ER Hooklying clamshells Tried side lying hip abduction and clamshell but very difficult SAQ  3 PATIENT EDUCATION:  Education details: impact spasticity and inflexibility has on gait and patella tracking during ambulation; encouraged increase in stretch; consider botox or other appropriate intervention with MD regarding spasticity (if needed); updates to HEP Person educated: Patient and Caregiver   Education method: Explanation Education comprehension: verbalized understanding   HOME EXERCISE PROGRAM: [email or web address]  Access Code: [email or web address] URL: [email or web address] Date: 07/17/2022 Prepared by: Stacie Glaze  Exercises - Supine Hip Internal and External Rotation  - 1 x daily - 7 x weekly - 2 sets - 10 reps - 3 hold - Supine Hip Abduction on Slider  - 1 x daily - 7 x weekly - 2 sets - 10 reps - 3 hold - Hooklying Clamshell with Resistance  - 1 x daily - 7 x weekly - 2 sets - 10 reps - 3 hold - Hip Abduction and Adduction Caregiver PROM  - 1 x daily - 7 x weekly - 1 sets - 5 reps - 30-60 hold - Caregiver PROM Hip Internal External Rotation  - 1 x daily - 7 x weekly - 1 sets - 5 reps - 30-60 hold - Supine Short Arc Quad  - 1 x daily - 7 x weekly - 2 sets - 10 reps - 3 hold ASSESSMENT:   CLINICAL IMPRESSION: Patient is doing well. Reports no new falls. Her mom is concerned about her balance. We worked on some of it today on uneven surfaces. She does  well on the bike, had to hold her LLE into abduction to avoid it constantly hitting the machine. Does well with standing volleyball hits and able to demonstrate good stepping strategies.   OBJECTIVE IMPAIRMENTS: Abnormal gait, decreased balance, decreased coordination, decreased endurance, decreased mobility, difficulty walking, decreased ROM, decreased strength, increased fascial restrictions, increased muscle spasms, impaired flexibility, impaired tone, improper body mechanics, and postural dysfunction.    ACTIVITY LIMITATIONS:  standing, squatting, stairs, transfers, and locomotion level   PARTICIPATION LIMITATIONS: shopping, community activity, and occupation   PERSONAL FACTORS: 1 comorbidity: CP  are also affecting patient's functional outcome.    REHAB POTENTIAL: Good   CLINICAL DECISION MAKING: Stable/uncomplicated   EVALUATION COMPLEXITY: Moderate     GOALS: Goals reviewed with patient? Yes   SHORT TERM GOALS: Target date: 05/01/22 I with initial HEP Baseline: Goal status: MET   LONG TERM GOALS: Target date: 10/14/22   I with final HEP Baseline:  Goal status: progressing with her and caregiver 08/19/22   2.  Improve TUG time to < 12 sec Baseline:  Goal status: met 08/14/22  3.  Improve 5 x STS to <12 Baseline:  Goal status:MET 06/05/22   4.  Increase BERG score to at least 44 Baseline:  Goal status: 45/56 MET   5.  Patient will ambulate with normalized gait pattern, minimizing abnormal weight shifts and increasing LLE position to more neutral hip rotation. Baseline:  Goal status: ongoing 08/28/22     PLAN:   PT FREQUENCY: 1-2x/week   PT DURATION: 10 weeks   PLANNED INTERVENTIONS: Therapeutic exercises, Therapeutic activity, Neuromuscular re-education, Balance training, Gait training, Patient/Family education, Self Care, Joint mobilization, Dry Needling, Cryotherapy, Moist heat, Ionotophoresis 4mg /ml Dexamethasone, and Manual therapy   PLAN FOR NEXT SESSION: work on hip ER and abduction as well as quad strength add more balance  4:28 PM,09/16/22 Stacie Glaze,  PT

## 2022-09-18 NOTE — Therapy (Signed)
OUTPATIENT PHYSICAL THERAPY TREATMENT Progress Note Reporting Period 08/14/22 to 09/19/22  See note below for Objective Data and Assessment of Progress/Goals.      Patient Name: Kerri Robertson MRN: [id number] DOB:11-24-2000, 22 y.o., female Today's Date: 04/10/2022   END OF SESSION:  PT End of Session - 09/19/22 0930     Visit Number 30    Date for PT Re-Evaluation 10/14/22    PT Start Time 0930    PT Stop Time 1015    PT Time Calculation (min) 45 min    Activity Tolerance Patient tolerated treatment well    Behavior During Therapy WFL for tasks assessed/performed                          Past Medical History:  Diagnosis Date   Allergy     Cerebral palsy ([name])     Stroke ([name])           Past Surgical History:  Procedure Laterality Date   ARM SURGERY        2013   FOOT SURGERY        2010   HAMSTRING LENGTHENING       TIBIA / FIBIA LENGTHENING            Patient Active Problem List    Diagnosis Date Noted   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   Balance problem 05/06/2018   Syncope 05/06/2018   History of cerebrovascular accident 11/18/2017   CP (cerebral palsy), spastic, 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[name]) 06/25/2017   Anxiety state 06/25/2017   Spasticity 06/25/2017   Allergic rhinitis 10/21/2016   Mild intermittent asthma 10/21/2016      PCP: Clayborne Dana, NP   REFERRING PROVIDER: Clayborne Dana, NP   REFERRING DIAG: G80.0 (ICD-10-CM) - CP (cerebral palsy), spastic, 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[name])    THERAPY DIAG:  Spastic quadriplegic cerebral palsy ([name])   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  PERTINENT HISTORY:     CP (cerebral palsy), spastic, 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[name]) - Primary         She is stable, at baseline (ambulatory, but with with some gait abnormalities, balance issues, and weakness). Rechecking CBC, iron, and vitamin D today. She has been on supplementation (refilling iron for her). Will make additional changes based on labs if indicated. PT referral placed to [facility] at her request. No acute concerns today.      PAIN:  Are you having pain? No   PRECAUTIONS: None   WEIGHT BEARING RESTRICTIONS: No   FALLS:  Has patient fallen in last 6 months? Yes. 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 Hobbies- Runner, broadcasting/film/video, archery, baseball, tennis, dance    PLOF: Needs assistance with ADLs   PATIENT GOALS: Patient would like to improve her flexibility and balance.   NEXT MD VISIT:  OBJECTIVE:    DIAGNOSTIC FINDINGS:  Radiology consult confirms lumbar scoliosis with apex at L2-3, but since patient is asymptomatic, recommend monitoring it and continue to strengthen.           COGNITION: Overall cognitive status: Within functional limits for tasks assessed                         SENSATION: Not tested     MUSCLE LENGTH: Hamstrings: Right 40 deg; Left 30 deg     POSTURE: left pelvic obliquity, weight shift right, and hips rotated to L with L LE held in IR, increased R WB     LOWER EXTREMITY ROM: Trunk lateraly flexed due to scoliosis, L hip unable to achieve neutral ER, abd limited, B hamstrings tight, L ankle does not achieve neutral DF or inversion. LUE with limited ROM at all joints, in all planes. she wears a splint on her wrist at night.       LOWER EXTREMITY MMT: RLE 4/5, LLE with fluctuating tone throughout, HS very weak, hips also weak-MMT accuracy questionable due to her tone.   FUNCTIONAL TESTS:  5 times sit to stand: 14.77,  07/03/22 13 seconds Timed up and go (TUG): 13.75 , 07/03/22 = 10 seconds Berg Balance Scale: 48/56   GAIT: Distance walked: clinic distances Assistive device utilized: None Level of assistance: Modified independence Comments: Step-to pattern;Decreased step length - left;Decreased stance time - left;Decreased weight shift to left;Shuffle;Ataxic;Lateral hip instability;Trunk rotated posteriorly on left      TODAY'S TREATMENT:                                                                                                              DATE:  09/19/22 Bike L3 x21mins  NuStep L7 x74mins  Step reach and turn in // bars  Mat table quadruped Tall kneeling and reaching  Bridges 2x10  SLR 2x10  Passive sretching HS, adductors 30s  Tall kneeling hip flexor stretch 30s- needs help getting in position unable to get RLE in front  Tall kneeling sitting on heels as far as she can and back up x10 Ball squeeze with LAQ 2x10   09/16/22 NuStep L5 x81mins  Bike L3 x72mins  Side steps on airex x10 Standing on airex feet together 30s, then reaching outside BOS  Walking beam in parallel bars Passive stretching of LE  Heel slides and hip abd 2x10 LLE    09/11/22 Bike level 3 x 6 minutes Airex balance beam side stepping, tandem walking Step and reach with turn Side step on and off 2" step Volley ball, volley ball on airex Left LE stretch  09/04/22 Nustep level 5 x 6 minutes Gait outside uneven terrain, grass, slope and curbs On foam mat working on walking, bending and lifting, cone toe touches, figure 8's and then getting up from the floor Passive stretch left hip Active hip abduction in supine, use of sliding board with towel and 2# on the towel  09/02/22 Nustep level 5 x 6 minutes  Gait outside SBA uneven  terrain, curbs, grass Volleyball with movements 2.5# on the left leg LAQ, marches and hip abduction LEg press 40# 3x10 Side stepping, backward walking Partial sit ups Passive stretch of the left LE  08/28/22 Elliptical x 2 minutes with Mod A Leg press 20# both legs 2x10, then left only 2x10 Nustep level 5 x 6 minutes Gait outside uneven terrain, negotiating curbs Volleyball Passive stretch left LE Bridges Left hip abduction in hooklying red tband Active left LE ER  08/26/22 Nustep level 5 x 5 mintues  on airex ball toss  Volleyball Bosu standing Airex balance beam side stepping and tandem walking Stretching of the left LE Left hip abduction Left hip ER Bridges, bridges with left hip  08/21/22 NuStep L5 x56mins Supine hip abd with band yellow 2x10  Supine ball squeeze 2x10 Ball squeeze with bridge 2x10 Sidelying abd 2x10  Ball squeeze with LAQ 2# 2x10  STS on airex 2x10- minA  20# leg press bilateral 2x10   08/19/22 Nustep level 5x 6 minutes 2.5# LAQ  20# leg press bilateral x 10, then left only with PT holding knee neutral so it does not go into valgus 2.5# SAQ Yellow tband left hip abduction in hooklying Left hip ER in supine Bridge ball b/n knees  08/14/22 Nustep level 5 x 6 minutes Low load very long duration stretches without pain for the left LE all motions and stretches in supine, side lying and prone  Active hip abduction supine with use of sliding board Active hip ER in supine with straight leg Hooklying yellow tband hip abduction left only bridges  07/24/22 Nustep level 5 x 6 minutes Focus on stretches of the left LE, slow low load long durations stretches to her tolerance Trunk stretches Feet on ball bridges, trunk rotation, isometric abs Bridges Left hip abduction slides  07/22/22 Nustep level 5 x 6 minutes LEg press with ball b/n thighs for proper alignment x 10 with 20#, then left only no weight with manually aligning the left knee no weight  2x7 Supine ball b/n knees bridges Hooklying yellow tband left hip abduction Worked on left hip ER actively Left hip abduction with sliding board PROM/stretches of the HS, piriformis, hip ER, adductors Single leg left leg bridges Prone hip extension needed mod A for this Education with caregiver on the stretches,   07/17/22 Nustep level 5 x 6 minutes Supine PROM left hip ER and adductors Sliding board abduction Supine active hip ER Hooklying clamshells Tried side lying hip abduction and clamshell but very difficult SAQ  3 PATIENT EDUCATION:  Education details: impact spasticity and inflexibility has on gait and patella tracking during ambulation; encouraged increase in stretch; consider botox or other appropriate intervention with MD regarding spasticity (if needed); updates to HEP Person educated: Patient and Caregiver   Education method: Explanation Education comprehension: verbalized understanding   HOME EXERCISE PROGRAM: [email or web address]  Access Code: [email or web address] URL: [email or web address] Date: 07/17/2022 Prepared by: Stacie Glaze  Exercises - Supine Hip Internal and External Rotation  - 1 x daily - 7 x weekly - 2 sets - 10 reps - 3 hold - Supine Hip Abduction on Slider  - 1 x daily - 7 x weekly - 2 sets - 10 reps - 3 hold - Hooklying Clamshell with Resistance  - 1 x daily - 7 x weekly - 2 sets - 10 reps - 3 hold - Hip Abduction and Adduction Caregiver PROM  - 1 x daily - 7 x weekly - 1  sets - 5 reps - 30-60 hold - Caregiver PROM Hip Internal External Rotation  - 1 x daily - 7 x weekly - 1 sets - 5 reps - 30-60 hold - Supine Short Arc Quad  - 1 x daily - 7 x weekly - 2 sets - 10 reps - 3 hold ASSESSMENT:   CLINICAL IMPRESSION: We tried to do some things in different positions today on bigger mat table. She is able to assume quadruped position but unable to reach out with either leg, tried doing modified birddogs but was too hard. She is able to demonstrate good  tall kneeling and reaching different targets without losing balance. She is also able to push her hips back and thrust forwards in small ranges. Most difficulty with hip flexor stretch due to positioning.   OBJECTIVE IMPAIRMENTS: Abnormal gait, decreased balance, decreased coordination, decreased endurance, decreased mobility, difficulty walking, decreased ROM, decreased strength, increased fascial restrictions, increased muscle spasms, impaired flexibility, impaired tone, improper body mechanics, and postural dysfunction.    ACTIVITY LIMITATIONS: standing, squatting, stairs, transfers, and locomotion level   PARTICIPATION LIMITATIONS: shopping, community activity, and occupation   PERSONAL FACTORS: 1 comorbidity: CP  are also affecting patient's functional outcome.    REHAB POTENTIAL: Good   CLINICAL DECISION MAKING: Stable/uncomplicated   EVALUATION COMPLEXITY: Moderate     GOALS: Goals reviewed with patient? Yes   SHORT TERM GOALS: Target date: 05/01/22 I with initial HEP Baseline: Goal status: MET   LONG TERM GOALS: Target date: 10/14/22   I with final HEP Baseline:  Goal status: progressing with her and caregiver 08/19/22   2.  Improve TUG time to < 12 sec Baseline:  Goal status: met 08/14/22  3.  Improve 5 x STS to <12 Baseline:  Goal status:MET 06/05/22   4.  Increase BERG score to at least 44 Baseline:  Goal status: 45/56 MET   5.  Patient will ambulate with normalized gait pattern, minimizing abnormal weight shifts and increasing LLE position to more neutral hip rotation. Baseline:  Goal status: ongoing 08/28/22, progressing 09/19/22     PLAN:   PT FREQUENCY: 1-2x/week   PT DURATION: 10 weeks   PLANNED INTERVENTIONS: Therapeutic exercises, Therapeutic activity, Neuromuscular re-education, Balance training, Gait training, Patient/Family education, Self Care, Joint mobilization, Dry Needling, Cryotherapy, Moist heat, Ionotophoresis 4mg /ml Dexamethasone, and  Manual therapy   PLAN FOR NEXT SESSION: work on hip ER and abduction as well as quad strength add more balance  10:11 AM,09/19/22 Stacie Glaze,  PT

## 2022-09-19 ENCOUNTER — Ambulatory Visit: Payer: BC Managed Care – PPO

## 2022-09-19 DIAGNOSIS — G801 Spastic diplegic cerebral palsy: Secondary | ICD-10-CM

## 2022-09-19 DIAGNOSIS — M6281 Muscle weakness (generalized): Secondary | ICD-10-CM

## 2022-09-19 DIAGNOSIS — R262 Difficulty in walking, not elsewhere classified: Secondary | ICD-10-CM

## 2022-09-19 DIAGNOSIS — R2689 Other abnormalities of gait and mobility: Secondary | ICD-10-CM

## 2022-09-19 DIAGNOSIS — G800 Spastic quadriplegic cerebral palsy: Secondary | ICD-10-CM

## 2022-09-19 DIAGNOSIS — R2681 Unsteadiness on feet: Secondary | ICD-10-CM

## 2022-09-23 ENCOUNTER — Encounter: Payer: Self-pay | Admitting: Physical Therapy

## 2022-09-23 ENCOUNTER — Ambulatory Visit: Payer: BC Managed Care – PPO | Admitting: Physical Therapy

## 2022-09-23 DIAGNOSIS — G800 Spastic quadriplegic cerebral palsy: Secondary | ICD-10-CM

## 2022-09-23 DIAGNOSIS — R2681 Unsteadiness on feet: Secondary | ICD-10-CM

## 2022-09-23 DIAGNOSIS — M6281 Muscle weakness (generalized): Secondary | ICD-10-CM

## 2022-09-23 DIAGNOSIS — R262 Difficulty in walking, not elsewhere classified: Secondary | ICD-10-CM

## 2022-09-23 NOTE — Therapy (Signed)
OUTPATIENT PHYSICAL THERAPY TREATMENT  Patient Name: Kerri Robertson MRN: [id number] DOB:2001-03-07, 22 y.o., female Today's Date: 04/10/2022   END OF SESSION:  PT End of Session - 09/23/22 1107     Visit Number 31    Date for PT Re-Evaluation 10/14/22    PT Start Time 1100    PT Stop Time 1146    PT Time Calculation (min) 46 min    Activity Tolerance Patient tolerated treatment well    Behavior During Therapy WFL for tasks assessed/performed                          Past Medical History:  Diagnosis Date   Allergy     Cerebral palsy ([name])     Stroke ([name])           Past Surgical History:  Procedure Laterality Date   ARM SURGERY        2013   FOOT SURGERY        2010   HAMSTRING LENGTHENING       TIBIA / FIBIA LENGTHENING            Patient Active Problem List    Diagnosis Date Noted   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   Balance problem 05/06/2018   Syncope 05/06/2018   History of cerebrovascular accident 11/18/2017   CP (cerebral palsy), spastic, 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[name]) 06/25/2017   Anxiety state 06/25/2017   Spasticity 06/25/2017   Allergic rhinitis 10/21/2016   Mild intermittent asthma 10/21/2016      PCP: Clayborne Dana, NP   REFERRING PROVIDER: Clayborne Dana, NP   REFERRING DIAG: G80.0 (ICD-10-CM) - CP (cerebral palsy), spastic, 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[name])    THERAPY DIAG:  Spastic quadriplegic cerebral palsy ([name])   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)   Other lack of coordination   Unsteadiness on feet   Difficulty in walking, not elsewhere classified   Left-sided weakness   Muscle weakness (generalized)   Abnormal posture   Cerebral palsy with spastic diplegia ([name])   Rationale for Evaluation and Treatment: Rehabilitation   ONSET DATE: 03/17/22   SUBJECTIVE:    SUBJECTIVE STATEMENT: Patient does report a  fall at work, reports that she was helping a 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 Rechecking CBC, iron, and vitamin D today. She has been on supplementation (refilling iron for her). Will make additional changes based on labs if indicated. PT referral placed to [facility] at her request. No acute concerns today.      PAIN:  Are you having pain? No   PRECAUTIONS: None   WEIGHT BEARING RESTRICTIONS: No   FALLS:  Has patient fallen in last 6 months? Yes. 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 Hobbies- Runner, broadcasting/film/video, archery, baseball, tennis, dance    PLOF: Needs assistance with ADLs   PATIENT GOALS: Patient would like to improve her flexibility and balance.   NEXT MD VISIT:    OBJECTIVE:  DIAGNOSTIC FINDINGS:  Radiology consult confirms lumbar scoliosis with apex at L2-3, but since patient is asymptomatic, recommend monitoring it and continue to strengthen.           COGNITION: Overall cognitive status: Within functional limits for tasks assessed                         SENSATION: Not tested     MUSCLE LENGTH: Hamstrings: Right 40 deg; Left 30 deg     POSTURE: left pelvic obliquity, weight shift right, and hips rotated to L with L LE held in IR, increased R WB     LOWER EXTREMITY ROM: Trunk lateraly flexed due to scoliosis, L hip unable to achieve neutral ER, abd limited, B hamstrings tight, L ankle does not achieve neutral DF or inversion. LUE with limited ROM at all joints, in all planes. she wears a splint on her wrist at night.      LOWER EXTREMITY  MMT: RLE 4/5, LLE with fluctuating tone throughout, HS very weak, hips also weak-MMT accuracy questionable due to her tone.   FUNCTIONAL TESTS:  5 times sit to stand: 14.77,  07/03/22 13 seconds Timed up and go (TUG): 13.75 , 07/03/22 = 10 seconds Berg Balance Scale: 48/56   GAIT: Distance walked: clinic distances Assistive device utilized: None Level of assistance: Modified independence Comments: Step-to pattern;Decreased step length - left;Decreased stance time - left;Decreased weight shift to left;Shuffle;Ataxic;Lateral hip instability;Trunk rotated posteriorly on left      TODAY'S TREATMENT:                                                                                                              DATE:  09/23/22 Bike level 4 x 6 minutes Leg press 50# x10 both legs, left only 20# 2x10 Leg curls 5# 3x10 left only Leg extension 5# 2x10 Volleyball Direction changes walking Standing on airex single arm pulls 5#, also did the left arm with some assist Bridges Yellow tband hip abduction left only Passive left LE stretches Gait in the grass outside  09/19/22 Bike L3 x108mins  NuStep L7 x5mins  Step reach and turn in // bars  Mat table quadruped Tall kneeling and reaching  Bridges 2x10  SLR 2x10  Passive sretching HS, adductors 30s  Tall kneeling hip flexor stretch 30s- needs help getting in position unable to get RLE in front  Tall kneeling sitting on heels as far as she can and back up x10 Ball squeeze with LAQ 2x10   09/16/22 NuStep L5 x24mins  Bike L3 x31mins  Side steps on airex x10 Standing on airex feet together 30s, then reaching outside BOS  Walking beam in parallel bars Passive stretching of LE  Heel slides and hip abd 2x10 LLE    09/11/22 Bike level 3 x 6 minutes Airex balance beam side stepping, tandem walking Step and reach with turn Side step on and off 2" step Volley ball, volley ball on airex Left LE stretch  09/04/22 Nustep level 5 x  6 minutes Gait  outside uneven terrain, grass, slope and curbs On foam mat working on walking, bending and lifting, cone toe touches, figure 8's and then getting up from the floor Passive stretch left hip Active hip abduction in supine, use of sliding board with towel and 2# on the towel  09/02/22 Nustep level 5 x 6 minutes Gait outside SBA uneven terrain, curbs, grass Volleyball with movements 2.5# on the left leg LAQ, marches and hip abduction LEg press 40# 3x10 Side stepping, backward walking Partial sit ups Passive stretch of the left LE  08/28/22 Elliptical x 2 minutes with Mod A Leg press 20# both legs 2x10, then left only 2x10 Nustep level 5 x 6 minutes Gait outside uneven terrain, negotiating curbs Volleyball Passive stretch left LE Bridges Left hip abduction in hooklying red tband Active left LE ER  08/26/22 Nustep level 5 x 5 mintues  on airex ball toss  Volleyball Bosu standing Airex balance beam side stepping and tandem walking Stretching of the left LE Left hip abduction Left hip ER Bridges, bridges with left hip PATIENT EDUCATION:  Education details: impact spasticity and inflexibility has on gait and patella tracking during ambulation; encouraged increase in stretch; consider botox or other appropriate intervention with MD regarding spasticity (if needed); updates to HEP Person educated: Patient and Caregiver   Education method: Explanation Education comprehension: verbalized understanding   HOME EXERCISE PROGRAM: [email or web address]  Access Code: [email or web address] URL: [email or web address] Date: 07/17/2022 Prepared by: Stacie Glaze  Exercises - Supine Hip Internal and External Rotation  - 1 x daily - 7 x weekly - 2 sets - 10 reps - 3 hold - Supine Hip Abduction on Slider  - 1 x daily - 7 x weekly - 2 sets - 10 reps - 3 hold - Hooklying Clamshell with Resistance  - 1 x daily - 7 x weekly - 2 sets - 10 reps - 3 hold - Hip Abduction and Adduction Caregiver PROM  - 1 x  daily - 7 x weekly - 1 sets - 5 reps - 30-60 hold - Caregiver PROM Hip Internal External Rotation  - 1 x daily - 7 x weekly - 1 sets - 5 reps - 30-60 hold - Supine Short Arc Quad  - 1 x daily - 7 x weekly - 2 sets - 10 reps - 3 hold ASSESSMENT:   CLINICAL IMPRESSION: Continue to push the ROM of the left LE, today she is walking much smoother with less whole body work for forward locomotion, she was able to do fast direction changes in a controlled manner.  She did have a little more spasticity today with the HS stretches, she is showing some increased use of the left leg with hip abduction  OBJECTIVE IMPAIRMENTS: Abnormal gait, decreased balance, decreased coordination, decreased endurance, decreased mobility, difficulty walking, decreased ROM, decreased strength, increased fascial restrictions, increased muscle spasms, impaired flexibility, impaired tone, improper body mechanics, and postural dysfunction.    ACTIVITY LIMITATIONS: standing, squatting, stairs, transfers, and locomotion level   PARTICIPATION LIMITATIONS: shopping, community activity, and occupation   PERSONAL FACTORS: 1 comorbidity: CP  are also affecting patient's functional outcome.    REHAB POTENTIAL: Good   CLINICAL DECISION MAKING: Stable/uncomplicated   EVALUATION COMPLEXITY: Moderate     GOALS: Goals reviewed with patient? Yes   SHORT TERM GOALS: Target date: 05/01/22 I with initial HEP Baseline: Goal status: MET   LONG TERM GOALS: Target date: 10/14/22   I with final HEP Baseline:  Goal status: progressing  with her and caregiver 08/19/22   2.  Improve TUG time to < 12 sec Baseline:  Goal status: met 08/14/22  3.  Improve 5 x STS to <12 Baseline:  Goal status:MET 06/05/22   4.  Increase BERG score to at least 44 Baseline:  Goal status: 45/56 MET   5.  Patient will ambulate with normalized gait pattern, minimizing abnormal weight shifts and increasing LLE position to more neutral hip  rotation. Baseline:  Goal status: ongoing 08/28/22, progressing 09/19/22     PLAN:   PT FREQUENCY: 1-2x/week   PT DURATION: 10 weeks   PLANNED INTERVENTIONS: Therapeutic exercises, Therapeutic activity, Neuromuscular re-education, Balance training, Gait training, Patient/Family education, Self Care, Joint mobilization, Dry Needling, Cryotherapy, Moist heat, Ionotophoresis 4mg /ml Dexamethasone, and Manual therapy   PLAN FOR NEXT SESSION: work on hip ER and abduction as well as quad strength add more balance  11:08 AM,09/23/22 Stacie Glaze,  PT

## 2022-09-25 ENCOUNTER — Ambulatory Visit: Payer: BC Managed Care – PPO | Admitting: Physical Therapy

## 2022-09-25 ENCOUNTER — Encounter: Payer: Self-pay | Admitting: Physical Therapy

## 2022-09-25 DIAGNOSIS — G800 Spastic quadriplegic cerebral palsy: Secondary | ICD-10-CM | POA: Diagnosis not present

## 2022-09-25 DIAGNOSIS — R2681 Unsteadiness on feet: Secondary | ICD-10-CM

## 2022-09-25 DIAGNOSIS — R262 Difficulty in walking, not elsewhere classified: Secondary | ICD-10-CM

## 2022-09-25 DIAGNOSIS — R2689 Other abnormalities of gait and mobility: Secondary | ICD-10-CM

## 2022-09-25 DIAGNOSIS — G801 Spastic diplegic cerebral palsy: Secondary | ICD-10-CM

## 2022-09-25 DIAGNOSIS — M6281 Muscle weakness (generalized): Secondary | ICD-10-CM

## 2022-09-25 NOTE — Therapy (Signed)
OUTPATIENT PHYSICAL THERAPY TREATMENT  Patient Name: Kerri Robertson MRN: [id number] DOB:10/04/2000, 22 y.o., female Today's Date: 04/10/2022   END OF SESSION:  PT End of Session - 09/25/22 1746     Visit Number 32    Date for PT Re-Evaluation 10/14/22    PT Start Time 1745    PT Stop Time 1830    PT Time Calculation (min) 45 min    Activity Tolerance Patient tolerated treatment well    Behavior During Therapy WFL for tasks assessed/performed                          Past Medical History:  Diagnosis Date   Allergy     Cerebral palsy ([name])     Stroke ([name])           Past Surgical History:  Procedure Laterality Date   ARM SURGERY        2013   FOOT SURGERY        2010   HAMSTRING LENGTHENING       TIBIA / FIBIA LENGTHENING            Patient Active Problem List    Diagnosis Date Noted   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   Balance problem 05/06/2018   Syncope 05/06/2018   History of cerebrovascular accident 11/18/2017   CP (cerebral palsy), spastic, 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[name]) 06/25/2017   Anxiety state 06/25/2017   Spasticity 06/25/2017   Allergic rhinitis 10/21/2016   Mild intermittent asthma 10/21/2016      PCP: Clayborne Dana, NP   REFERRING PROVIDER: Clayborne Dana, NP   REFERRING DIAG: G80.0 (ICD-10-CM) - CP (cerebral palsy), spastic, 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[name])    THERAPY DIAG:  Spastic quadriplegic cerebral palsy ([name])   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)   Other lack of coordination   Unsteadiness on feet   Difficulty in walking, not elsewhere classified   Left-sided weakness   Muscle weakness (generalized)   Abnormal posture   Cerebral palsy with spastic diplegia ([name])   Rationale for Evaluation and Treatment: Rehabilitation   ONSET DATE: 03/17/22   SUBJECTIVE:    SUBJECTIVE STATEMENT: Patient reports no  issues, mom comes on and is worried about some balance issues she has seen lately with walking and shopping with [place], reports that she will reach for things to help with her balance  PERTINENT HISTORY:     CP (cerebral palsy), spastic, 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[name]) - Primary         She is stable, at baseline (ambulatory, but with with some gait abnormalities, balance issues, and weakness). Rechecking CBC, iron, and vitamin D today. She has been on supplementation (refilling iron for her). Will make additional changes based on labs if indicated. PT referral placed to [facility] at her request. No acute concerns today.      PAIN:  Are you having pain? No   PRECAUTIONS: None   WEIGHT BEARING RESTRICTIONS: No   FALLS:  Has patient fallen in last 6 months? Yes. 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 Hobbies- Runner, broadcasting/film/video, archery, baseball, tennis, dance    PLOF: Needs assistance with ADLs   PATIENT GOALS: Patient would like to improve her flexibility  and balance.   NEXT MD VISIT:    OBJECTIVE:    DIAGNOSTIC FINDINGS:  Radiology consult confirms lumbar scoliosis with apex at L2-3, but since patient is asymptomatic, recommend monitoring it and continue to strengthen.           COGNITION: Overall cognitive status: Within functional limits for tasks assessed                         SENSATION: Not tested     MUSCLE LENGTH: Hamstrings: Right 40 deg; Left 30 deg     POSTURE: left pelvic obliquity, weight shift right, and hips rotated to L with L LE held in IR, increased R WB     LOWER EXTREMITY ROM: Trunk lateraly flexed due to scoliosis, L hip unable to achieve neutral ER, abd limited, B hamstrings tight, L ankle does not achieve neutral DF or inversion. LUE with limited ROM at all joints,  in all planes. she wears a splint on her wrist at night.      LOWER EXTREMITY MMT: RLE 4/5, LLE with fluctuating tone throughout, HS very weak, hips also weak-MMT accuracy questionable due to her tone.   FUNCTIONAL TESTS:  5 times sit to stand: 14.77,  07/03/22 13 seconds Timed up and go (TUG): 13.75 , 07/03/22 = 10 seconds Berg Balance Scale: 48/56   GAIT: Distance walked: clinic distances Assistive device utilized: None Level of assistance: Modified independence Comments: Step-to pattern;Decreased step length - left;Decreased stance time - left;Decreased weight shift to left;Shuffle;Ataxic;Lateral hip instability;Trunk rotated posteriorly on left      TODAY'S TREATMENT:                                                                                                              DATE:  09/25/22 Nustep level 5 x 5 minutes Bike level 4 x 4 mintues Volley ball, volley ball on airex Side step on and off airex On airex cone toe touch On airex head turns On airex eyes closed Passive ROM of the HS, adductors, ER,Hip flexor and quad Supine bridge, bridge with clamshell Yellow tband left hip abduction Active ER of the leg  09/23/22 Bike level 4 x 6 minutes Leg press 50# x10 both legs, left only 20# 2x10 Leg curls 5# 3x10 left only Leg extension 5# 2x10 Volleyball Direction changes walking Standing on airex single arm pulls 5#, also did the left arm with some assist Bridges Yellow tband hip abduction left only Passive left LE stretches Gait in the grass outside  09/19/22 Bike L3 x10mins  NuStep L7 x67mins  Step reach and turn in // bars  Mat table quadruped Tall kneeling and reaching  Bridges 2x10  SLR 2x10  Passive sretching HS, adductors 30s  Tall kneeling hip flexor stretch 30s- needs help getting in position unable to get RLE in front  Tall kneeling sitting on heels as far as she can and back up x10 Ball squeeze with LAQ 2x10   09/16/22 NuStep L5 x87mins  Bike L3 x16mins  Side steps on airex x10 Standing on airex feet together 30s, then reaching outside BOS  Walking beam in parallel bars Passive stretching of LE  Heel slides and hip abd 2x10 LLE    09/11/22 Bike level 3 x 6 minutes Airex balance beam side stepping, tandem walking Step and reach with turn Side step on and off 2" step Volley ball, volley ball on airex Left LE stretch  09/04/22 Nustep level 5 x 6 minutes Gait outside uneven terrain, grass, slope and curbs On foam mat working on walking, bending and lifting, cone toe touches, figure 8's and then getting up from the floor Passive stretch left hip Active hip abduction in supine, use of sliding board with towel and 2# on the towel  09/02/22 Nustep level 5 x 6 minutes Gait outside SBA uneven terrain, curbs, grass Volleyball with movements 2.5# on the left leg LAQ, marches and hip abduction LEg press 40# 3x10 Side stepping, backward walking Partial sit ups Passive stretch of the left LE  08/28/22 Elliptical x 2 minutes with Mod A Leg press 20# both legs 2x10, then left only 2x10 Nustep level 5 x 6 minutes Gait outside uneven terrain, negotiating curbs Volleyball Passive stretch left LE Bridges Left hip abduction in hooklying red tband Active left LE ER  PATIENT EDUCATION:  Education details: impact spasticity and inflexibility has on gait and patella tracking during ambulation; encouraged increase in stretch; consider botox or other appropriate intervention with MD regarding spasticity (if needed); updates to HEP Person educated: Patient and Caregiver   Education method: Explanation Education comprehension: verbalized understanding   HOME EXERCISE PROGRAM: [email or web address]  Access Code: [email or web address] URL: [email or web address] Date: 07/17/2022 Prepared by: Stacie Glaze  Exercises - Supine Hip Internal and External Rotation  - 1 x daily - 7 x weekly - 2 sets - 10 reps - 3 hold - Supine Hip Abduction on Slider  - 1 x  daily - 7 x weekly - 2 sets - 10 reps - 3 hold - Hooklying Clamshell with Resistance  - 1 x daily - 7 x weekly - 2 sets - 10 reps - 3 hold - Hip Abduction and Adduction Caregiver PROM  - 1 x daily - 7 x weekly - 1 sets - 5 reps - 30-60 hold - Caregiver PROM Hip Internal External Rotation  - 1 x daily - 7 x weekly - 1 sets - 5 reps - 30-60 hold - Supine Short Arc Quad  - 1 x daily - 7 x weekly - 2 sets - 10 reps - 3 hold ASSESSMENT:   CLINICAL IMPRESSION: Mom was present today, had concerns about balance, Kellie has done very well for me lately with balance and did well again today, I am not sure if it is fatigue, but again she has been walking better an I am not sure if Letha is having to adjust to an increase ROM or a different feeling in the leg with her having some increased flexibility, she is getting some active left hip ER, abduction and extension  OBJECTIVE IMPAIRMENTS: Abnormal gait, decreased balance, decreased coordination, decreased endurance, decreased mobility, difficulty walking, decreased ROM, decreased strength, increased fascial restrictions, increased muscle spasms, impaired flexibility, impaired tone, improper body mechanics, and postural dysfunction.    ACTIVITY LIMITATIONS: standing, squatting, stairs, transfers, and locomotion level   PARTICIPATION LIMITATIONS: shopping, community activity, and occupation   PERSONAL FACTORS: 1 comorbidity: CP  are also affecting patient's functional outcome.    REHAB POTENTIAL: Good   CLINICAL DECISION  MAKING: Stable/uncomplicated   EVALUATION COMPLEXITY: Moderate     GOALS: Goals reviewed with patient? Yes   SHORT TERM GOALS: Target date: 05/01/22 I with initial HEP Baseline: Goal status: MET   LONG TERM GOALS: Target date: 10/14/22   I with final HEP Baseline:  Goal status: progressing with her and caregiver 08/19/22   2.  Improve TUG time to < 12 sec Baseline:  Goal status: met 08/14/22  3.  Improve 5 x STS to  <12 Baseline:  Goal status:MET 06/05/22   4.  Increase BERG score to at least 44 Baseline:  Goal status: 45/56 MET   5.  Patient will ambulate with normalized gait pattern, minimizing abnormal weight shifts and increasing LLE position to more neutral hip rotation. Baseline:  Goal status: ongoing 08/28/22, progressing 09/19/22     PLAN:   PT FREQUENCY: 1-2x/week   PT DURATION: 10 weeks   PLANNED INTERVENTIONS: Therapeutic exercises, Therapeutic activity, Neuromuscular re-education, Balance training, Gait training, Patient/Family education, Self Care, Joint mobilization, Dry Needling, Cryotherapy, Moist heat, Ionotophoresis 4mg /ml Dexamethasone, and Manual therapy   PLAN FOR NEXT SESSION: work on hip ER and abduction as well as quad strength add more balance  5:47 PM,09/25/22 Stacie Glaze,  PT

## 2022-09-30 ENCOUNTER — Ambulatory Visit: Payer: BC Managed Care – PPO | Admitting: Physical Therapy

## 2022-09-30 ENCOUNTER — Ambulatory Visit (INDEPENDENT_AMBULATORY_CARE_PROVIDER_SITE_OTHER): Payer: BC Managed Care – PPO | Admitting: Family Medicine

## 2022-09-30 ENCOUNTER — Encounter: Payer: Self-pay | Admitting: Physical Therapy

## 2022-09-30 ENCOUNTER — Encounter: Payer: Self-pay | Admitting: Family Medicine

## 2022-09-30 VITALS — BP 100/70 | HR 78 | Temp 98.2°F | Resp 16 | Ht 61.0 in | Wt 114.2 lb

## 2022-09-30 DIAGNOSIS — R2689 Other abnormalities of gait and mobility: Secondary | ICD-10-CM

## 2022-09-30 DIAGNOSIS — G800 Spastic quadriplegic cerebral palsy: Secondary | ICD-10-CM

## 2022-09-30 DIAGNOSIS — H6123 Impacted cerumen, bilateral: Secondary | ICD-10-CM | POA: Diagnosis not present

## 2022-09-30 DIAGNOSIS — R2681 Unsteadiness on feet: Secondary | ICD-10-CM

## 2022-09-30 DIAGNOSIS — R262 Difficulty in walking, not elsewhere classified: Secondary | ICD-10-CM

## 2022-09-30 DIAGNOSIS — M6281 Muscle weakness (generalized): Secondary | ICD-10-CM

## 2022-09-30 DIAGNOSIS — G801 Spastic diplegic cerebral palsy: Secondary | ICD-10-CM

## 2022-09-30 MED ORDER — PROPRANOLOL HCL 10 MG PO TABS: 5.0000 mg | ORAL_TABLET | Freq: Three times a day (TID) | ORAL | 1 refills | Status: DC

## 2022-09-30 NOTE — Assessment & Plan Note (Signed)
R >L  Irrigated with no complications  No infection F/u as needed

## 2022-09-30 NOTE — Progress Notes (Signed)
Subjective:   By signing my name below, I, Shehryar Baig, attest that this documentation has been prepared under the direction and in the presence of Donato Schultz, DO. 09/30/2022   Patient ID: Kerri Robertson, female    DOB: 07-18-2000, 22 y.o.   MRN: [id number]  Chief Complaint  Patient presents with   Ear Fullness    Pt states sxs started last week. Pt states left ear feels full and muffled. No pain.     HPI Patient is in today for a office visit. She is present with her aide during this visit.   She complains of impacted cerumen in her left eat since Friday 09/26/2022.  She is requesting an evaluation and appointment with occupational therapy.  She is also requesting for a new evaluation and renew he sessions with her current physical therapy.  She is requesting a refill for propanolol.    Past Medical History:  Diagnosis Date   Allergy    Cerebral palsy ([name])    CP (cerebral palsy) ([name])    Stroke [facility])     Past Surgical History:  Procedure Laterality Date   ARM SURGERY     2013   FOOT SURGERY     2010   HAMSTRING LENGTHENING     TIBIA / FIBIA LENGTHENING      Family History  Problem Relation Age of Onset   Hypertension Mother    Migraines Mother    Depression Mother    Anxiety disorder Mother    Hypertension Father    Depression Father    Anxiety disorder Father    Hypertension Maternal Grandmother    Hypertension Maternal Grandfather    Heart disease Other    Diabetes Other    Seizures Neg Hx    Bipolar disorder Neg Hx    Schizophrenia Neg Hx    ADD / ADHD Neg Hx    Autism Neg Hx     Social History   Socioeconomic History   Marital status: Single    Spouse name: Not on file   Number of children: Not on file   Years of education: Not on file   Highest education level: Not on file  Occupational History   Not on file  Tobacco Use   Smoking status: Never   Smokeless tobacco: Never  Vaping Use   Vaping Use: Never used  Substance and  Sexual Activity   Alcohol use: Not on file   Drug use: Never   Sexual activity: Not on file  Other Topics Concern   Not on file  Social History Narrative   Tyah is in the 12th grade at [employer]; she does well in school. She enjoys acting, tennis, bowling and singing. She lives with her mother and father.    Social Determinants of Health   Financial Resource Strain: Not on file  Food Insecurity: Not on file  Transportation Needs: Not on file  Physical Activity: Not on file  Stress: Not on file  Social Connections: Not on file  Intimate Partner Violence: Not on file    Outpatient Medications Prior to Visit  Medication Sig Dispense Refill   Cholecalciferol (VITAMIN D-3) 25 MCG (1000 UT) CAPS Take by mouth.     FEROSUL 325 (65 Fe) MG tablet Take 1 tablet (325 mg total) by mouth every morning. 90 tablet 1   Midazolam (NAYZILAM) 5 MG/0.1ML SOLN Use 1 spray in 1 nostril as needed for seizure lasting longer than 2 minutes or for  seizure cluster.  May repeat in 5 minutes if needed. 2 each 3   ondansetron (ZOFRAN) 4 MG tablet Take 1 tablet (4 mg total) by mouth every 8 (eight) hours as needed for nausea or vomiting. (Patient taking differently: Take 4 mg by mouth as needed for nausea or vomiting.) 20 tablet 0   promethazine (PHENERGAN) 25 MG suppository Place 1 suppository (25 mg total) rectally every 6 (six) hours as needed for nausea or vomiting. Take if vomiting and cannot take oral zofran or zofran doesn't work 12 each 11   Ubrogepant (UBRELVY) 100 MG TABS Take 1 tablet (100 mg total) by mouth every 2 (two) hours as needed. Maximum 200mg  a day. 16 tablet 11   No facility-administered medications prior to visit.    Allergies  Allergen Reactions   Benzodiazepines Nausea And Vomiting and Other (See Comments)    Sensitivity   Lactose Other (See Comments)   Latex Itching and Other (See Comments)    Review of Systems  Constitutional:  Negative for fever and malaise/fatigue.   HENT:  Negative for congestion.        (+)impacted cerumen in left ear  Eyes:  Negative for blurred vision.  Respiratory:  Negative for cough and shortness of breath.   Cardiovascular:  Negative for chest pain, palpitations and leg swelling.  Gastrointestinal:  Negative for vomiting.  Musculoskeletal:  Negative for back pain.  Skin:  Negative for rash.  Neurological:  Negative for loss of consciousness and headaches.       Objective:    Physical Exam Vitals and nursing note reviewed.  Constitutional:      General: She is not in acute distress.    Appearance: Normal appearance. She is not ill-appearing.  HENT:     Head: Normocephalic and atraumatic.     Right Ear: Tympanic membrane, ear canal and external ear normal.     Left Ear: Ear canal and external ear normal. There is impacted cerumen.     Ears:     Comments: Both ears were irrigated and cleared of wax during visit Unable to use hoop  Irrigated with no complications  Eyes:     Extraocular Movements: Extraocular movements intact.     Pupils: Pupils are equal, round, and reactive to light.  Cardiovascular:     Rate and Rhythm: Normal rate and regular rhythm.     Heart sounds: Normal heart sounds. No murmur heard.    No gallop.  Pulmonary:     Effort: Pulmonary effort is normal. No respiratory distress.     Breath sounds: Normal breath sounds. No wheezing or rales.  Skin:    General: Skin is warm and dry.  Neurological:     General: No focal deficit present.     Mental Status: She is alert and oriented to person, place, and time.  Psychiatric:        Mood and Affect: Mood normal.        Judgment: Judgment normal.     BP 100/70 (BP Location: Right Arm, Patient Position: Sitting, Cuff Size: Normal)   Pulse 78   Temp 98.2 F (36.8 C) (Oral)   Resp 16   Ht 5\' 1"  (1.549 m)   Wt 114 lb 3.2 oz (51.8 kg)   SpO2 97%   BMI 21.58 kg/m  Wt Readings from Last 3 Encounters:  09/30/22 114 lb 3.2 oz (51.8 kg)  07/02/22  113 lb (51.3 kg)  06/06/22 113 lb (51.3 kg)  Assessment & Plan:  CP (cerebral palsy), spastic, 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[name]) Assessment & Plan: Mother requesting OT referral and con't pt   Orders: -     Ambulatory referral to Physical Therapy  Bilateral impacted cerumen Assessment & Plan: R >L  Irrigated with no complications  No infection F/u as needed    Other orders -     Propranolol HCl; Take 0.5 tablets (5 mg total) by mouth 3 (three) times daily.  Dispense: 90 tablet; Refill: 1    I, Donato Schultz, DO, personally preformed the services described in this documentation.  All medical record entries made by the scribe were at my direction and in my presence.  I have reviewed the chart and discharge instructions (if applicable) and agree that the record reflects my personal performance and is accurate and complete. 09/30/2022   I,Shehryar Baig,acting as a scribe for Donato Schultz, DO.,have documented all relevant documentation on the behalf of Donato Schultz, DO,as directed by  Donato Schultz, DO while in the presence of Donato Schultz, DO.   Donato Schultz, DO

## 2022-09-30 NOTE — Assessment & Plan Note (Signed)
Mother requesting OT referral and con't pt

## 2022-09-30 NOTE — Therapy (Signed)
OUTPATIENT PHYSICAL THERAPY TREATMENT  Patient Name: Kerri Robertson MRN: [id number] DOB:[birth year], 22 y.o., female Today's Date: 04/10/2022   END OF SESSION:  PT End of Session - 09/30/22 1351     Visit Number 33    Date for PT Re-Evaluation 10/14/22    PT Start Time 1351    PT Stop Time 1438    PT Time Calculation (min) 47 min    Activity Tolerance Patient tolerated treatment well    Behavior During Therapy WFL for tasks assessed/performed                          Past Medical History:  Diagnosis Date   Allergy     Cerebral palsy ([name])     Stroke ([name])           Past Surgical History:  Procedure Laterality Date   ARM SURGERY        2013   FOOT SURGERY        2010   HAMSTRING LENGTHENING       TIBIA / FIBIA LENGTHENING            Patient Active Problem List    Diagnosis Date Noted   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   Balance problem 05/06/2018   Syncope 05/06/2018   History of cerebrovascular accident 11/18/2017   CP (cerebral palsy), spastic, 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[name]) 06/25/2017   Anxiety state 06/25/2017   Spasticity 06/25/2017   Allergic rhinitis 10/21/2016   Mild intermittent asthma 10/21/2016      PCP: Clayborne Dana, NP   REFERRING PROVIDER: Clayborne Dana, NP   REFERRING DIAG: G80.0 (ICD-10-CM) - CP (cerebral palsy), spastic, 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[name])    THERAPY DIAG:  Spastic quadriplegic cerebral palsy ([name])   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)   Other lack of coordination   Unsteadiness on feet   Difficulty in walking, not elsewhere classified   Left-sided weakness   Muscle weakness (generalized)   Abnormal posture   Cerebral palsy with spastic diplegia ([name])   Rationale for Evaluation and Treatment: Rehabilitation   ONSET DATE: 03/17/22   SUBJECTIVE:    SUBJECTIVE STATEMENT: Patient reports no  issues, no falls, reports that she is tired today, did not sleep well  PERTINENT HISTORY:     CP (cerebral palsy), spastic, 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[name]) - Primary         She is stable, at baseline (ambulatory, but with with some gait abnormalities, balance issues, and weakness). Rechecking CBC, iron, and vitamin D today. She has been on supplementation (refilling iron for her). Will make additional changes based on labs if indicated. PT referral placed to [facility] at her request. No acute concerns today.      PAIN:  Are you having pain? No   PRECAUTIONS: None   WEIGHT BEARING RESTRICTIONS: No   FALLS:  Has patient fallen in last 6 months? Yes. 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 Hobbies- Runner, broadcasting/film/video, archery, baseball, tennis, dance    PLOF: Needs assistance with ADLs   PATIENT GOALS: Patient would like to improve her flexibility and balance.   NEXT MD VISIT:    OBJECTIVE:    DIAGNOSTIC FINDINGS:  Radiology consult confirms  lumbar scoliosis with apex at L2-3, but since patient is asymptomatic, recommend monitoring it and continue to strengthen.           COGNITION: Overall cognitive status: Within functional limits for tasks assessed                         SENSATION: Not tested     MUSCLE LENGTH: Hamstrings: Right 40 deg; Left 30 deg     POSTURE: left pelvic obliquity, weight shift right, and hips rotated to L with L LE held in IR, increased R WB     LOWER EXTREMITY ROM: Trunk lateraly flexed due to scoliosis, L hip unable to achieve neutral ER, abd limited, B hamstrings tight, L ankle does not achieve neutral DF or inversion. LUE with limited ROM at all joints, in all planes. she wears a splint on her wrist at night.      LOWER EXTREMITY MMT: RLE 4/5, LLE with fluctuating  tone throughout, HS very weak, hips also weak-MMT accuracy questionable due to her tone.   FUNCTIONAL TESTS:  5 times sit to stand: 14.77,  07/03/22 13 seconds, 09/30/22 = 12 seconds Timed up and go (TUG): 13.75 , 07/03/22 = 10 seconds, 09/30/22 9 seconds Berg Balance Scale: 48/56   GAIT: Distance walked: clinic distances Assistive device utilized: None Level of assistance: Modified independence Comments: Step-to pattern;Decreased step length - left;Decreased stance time - left;Decreased weight shift to left;Shuffle;Ataxic;Lateral hip instability;Trunk rotated posteriorly on left      TODAY'S TREATMENT:                                                                                                              DATE:  09/30/22 Nustep level 5 x 6 minutes Gait outside in the grass, curbs uneven ground SRA/CGA On airex side stepping Stairs step over step Volley ball Direction changes On airex cone toe touches Feet on ball K2C, rotations, bridges, isometric abs Yellow tband left hip abduction Active left hip ER Supine left hip extension green tband Crunches Passive stretch of the left LE  09/25/22 Nustep level 5 x 5 minutes Bike level 4 x 4 mintues Volley ball, volley ball on airex Side step on and off airex On airex cone toe touch On airex head turns On airex eyes closed Passive ROM of the HS, adductors, ER,Hip flexor and quad Supine bridge, bridge with clamshell Yellow tband left hip abduction Active ER of the leg  09/23/22 Bike level 4 x 6 minutes Leg press 50# x10 both legs, left only 20# 2x10 Leg curls 5# 3x10 left only Leg extension 5# 2x10 Volleyball Direction changes walking Standing on airex single arm pulls 5#, also did the left arm with some assist Bridges Yellow tband hip abduction left only Passive left LE stretches Gait in the grass outside  09/19/22 Bike L3 x40mins  NuStep L7 x88mins  Step reach and turn in // bars  Mat table quadruped Tall kneeling and  reaching  Bridges 2x10  SLR 2x10  Passive  sretching HS, adductors 30s  Tall kneeling hip flexor stretch 30s- needs help getting in position unable to get RLE in front  Tall kneeling sitting on heels as far as she can and back up x10 Ball squeeze with LAQ 2x10   09/16/22 NuStep L5 x5mins  Bike L3 x9mins  Side steps on airex x10 Standing on airex feet together 30s, then reaching outside BOS  Walking beam in parallel bars Passive stretching of LE  Heel slides and hip abd 2x10 LLE    09/11/22 Bike level 3 x 6 minutes Airex balance beam side stepping, tandem walking Step and reach with turn Side step on and off 2" step Volley ball, volley ball on airex Left LE stretch  09/04/22 Nustep level 5 x 6 minutes Gait outside uneven terrain, grass, slope and curbs On foam mat working on walking, bending and lifting, cone toe touches, figure 8's and then getting up from the floor Passive stretch left hip Active hip abduction in supine, use of sliding board with towel and 2# on the towel  09/02/22 Nustep level 5 x 6 minutes Gait outside SBA uneven terrain, curbs, grass Volleyball with movements 2.5# on the left leg LAQ, marches and hip abduction LEg press 40# 3x10 Side stepping, backward walking Partial sit ups Passive stretch of the left LE  08/28/22 Elliptical x 2 minutes with Mod A Leg press 20# both legs 2x10, then left only 2x10 Nustep level 5 x 6 minutes Gait outside uneven terrain, negotiating curbs Volleyball Passive stretch left LE Bridges Left hip abduction in hooklying red tband Active left LE ER  PATIENT EDUCATION:  Education details: impact spasticity and inflexibility has on gait and patella tracking during ambulation; encouraged increase in stretch; consider botox or other appropriate intervention with MD regarding spasticity (if needed); updates to HEP Person educated: Patient and Caregiver   Education method: Explanation Education comprehension: verbalized  understanding   HOME EXERCISE PROGRAM: [email or web address]  Access Code: [email or web address] URL: [email or web address] Date: 07/17/2022 Prepared by: Stacie Glaze  Exercises - Supine Hip Internal and External Rotation  - 1 x daily - 7 x weekly - 2 sets - 10 reps - 3 hold - Supine Hip Abduction on Slider  - 1 x daily - 7 x weekly - 2 sets - 10 reps - 3 hold - Hooklying Clamshell with Resistance  - 1 x daily - 7 x weekly - 2 sets - 10 reps - 3 hold - Hip Abduction and Adduction Caregiver PROM  - 1 x daily - 7 x weekly - 1 sets - 5 reps - 30-60 hold - Caregiver PROM Hip Internal External Rotation  - 1 x daily - 7 x weekly - 1 sets - 5 reps - 30-60 hold - Supine Short Arc Quad  - 1 x daily - 7 x weekly - 2 sets - 10 reps - 3 hold ASSESSMENT:   CLINICAL IMPRESSION: MRetested TUG and 5XSTS, she improved in both of these, the gait outside she is taking a better step with the left leg, it still rotates in at mid stance phase, I continue to push the ROM of the left hip stretching into  the tightness of the hip without over doing it and trying to leave her functional spasticity in tact for locomotion  OBJECTIVE IMPAIRMENTS: Abnormal gait, decreased balance, decreased coordination, decreased endurance, decreased mobility, difficulty walking, decreased ROM, decreased strength, increased fascial restrictions, increased muscle spasms, impaired flexibility, impaired tone, improper body mechanics, and postural dysfunction.    ACTIVITY LIMITATIONS:  standing, squatting, stairs, transfers, and locomotion level   PARTICIPATION LIMITATIONS: shopping, community activity, and occupation   PERSONAL FACTORS: 1 comorbidity: CP  are also affecting patient's functional outcome.    REHAB POTENTIAL: Good   CLINICAL DECISION MAKING: Stable/uncomplicated   EVALUATION COMPLEXITY: Moderate     GOALS: Goals reviewed with patient? Yes   SHORT TERM GOALS: Target date: 05/01/22 I with initial HEP Baseline: Goal  status: MET   LONG TERM GOALS: Target date: 10/14/22   I with final HEP Baseline:  Goal status: progressing with her and caregiver 08/19/22   2.  Improve TUG time to < 12 sec Baseline:  Goal status: met 08/14/22  3.  Improve 5 x STS to <12 Baseline:  Goal status:MET 06/05/22   4.  Increase BERG score to at least 44 Baseline:  Goal status: 45/56 MET   5.  Patient will ambulate with normalized gait pattern, minimizing abnormal weight shifts and increasing LLE position to more neutral hip rotation. Baseline:  Goal status: ongoing 08/28/22, progressing 09/19/22     PLAN:   PT FREQUENCY: 1-2x/week   PT DURATION: 10 weeks   PLANNED INTERVENTIONS: Therapeutic exercises, Therapeutic activity, Neuromuscular re-education, Balance training, Gait training, Patient/Family education, Self Care, Joint mobilization, Dry Needling, Cryotherapy, Moist heat, Ionotophoresis 4mg /ml Dexamethasone, and Manual therapy   PLAN FOR NEXT SESSION: work on hip ER and abduction as well as quad strength add more balance  1:51 PM,09/30/22 Stacie Glaze,  PT

## 2022-10-02 ENCOUNTER — Ambulatory Visit: Payer: BC Managed Care – PPO | Admitting: Physical Therapy

## 2022-10-02 ENCOUNTER — Encounter: Payer: Self-pay | Admitting: Physical Therapy

## 2022-10-02 DIAGNOSIS — G800 Spastic quadriplegic cerebral palsy: Secondary | ICD-10-CM

## 2022-10-02 DIAGNOSIS — M6281 Muscle weakness (generalized): Secondary | ICD-10-CM

## 2022-10-02 DIAGNOSIS — R2689 Other abnormalities of gait and mobility: Secondary | ICD-10-CM

## 2022-10-02 DIAGNOSIS — R2681 Unsteadiness on feet: Secondary | ICD-10-CM

## 2022-10-02 DIAGNOSIS — G801 Spastic diplegic cerebral palsy: Secondary | ICD-10-CM

## 2022-10-02 DIAGNOSIS — R262 Difficulty in walking, not elsewhere classified: Secondary | ICD-10-CM

## 2022-10-02 NOTE — Therapy (Signed)
OUTPATIENT PHYSICAL THERAPY TREATMENT  Patient Name: Kerri [facility] MRN: [id number] DOB:24-Jan-2001, 22 y.o., female Today's Date: 04/10/2022   END OF SESSION:  PT End of Session - 10/02/22 1310     Visit Number 34    Date for PT Re-Evaluation 10/14/22    PT Start Time 1309    PT Stop Time 1355    PT Time Calculation (min) 46 min    Activity Tolerance Patient tolerated treatment well    Behavior During Therapy WFL for tasks assessed/performed                  Past Medical History:  Diagnosis Date   Allergy     Cerebral palsy ([name])     Stroke ([name])           Past Surgical History:  Procedure Laterality Date   ARM SURGERY        2013   FOOT SURGERY        2010   HAMSTRING LENGTHENING       TIBIA / FIBIA LENGTHENING            Patient Active Problem List    Diagnosis Date Noted   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   Balance problem 05/06/2018   Syncope 05/06/2018   History of cerebrovascular accident 11/18/2017   CP (cerebral palsy), spastic, 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[name]) 06/25/2017   Anxiety state 06/25/2017   Spasticity 06/25/2017   Allergic rhinitis 10/21/2016   Mild intermittent asthma 10/21/2016      PCP: Clayborne Dana, NP   REFERRING PROVIDER: Clayborne Dana, NP   REFERRING DIAG: G80.0 (ICD-10-CM) - CP (cerebral palsy), spastic, 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[name])    THERAPY DIAG:  Spastic quadriplegic cerebral palsy ([name])   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)   Other lack of coordination   Unsteadiness on feet   Difficulty in walking, not elsewhere classified   Left-sided weakness   Muscle weakness (generalized)   Abnormal posture   Cerebral palsy with spastic diplegia ([name])   Rationale for Evaluation and Treatment: Rehabilitation   ONSET DATE: 03/17/22   SUBJECTIVE:    SUBJECTIVE STATEMENT: Patient reports that she has been  tired lately, caregiver reports that when she is tired she does stumble a little more often  PERTINENT HISTORY:     CP (cerebral palsy), spastic, 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[name]) - Primary         She is stable, at baseline (ambulatory, but with with some gait abnormalities, balance issues, and weakness). Rechecking CBC, iron, and vitamin D today. She has been on supplementation (refilling iron for her). Will make additional changes based on labs if indicated. PT referral placed to [facility] at her request. No acute concerns today.      PAIN:  Are you having pain? No   PRECAUTIONS: None   WEIGHT BEARING RESTRICTIONS: No   FALLS:  Has patient fallen in last 6 months? Yes. 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 Hobbies- Runner, broadcasting/film/video, archery, baseball, tennis, dance    PLOF: Needs assistance with ADLs   PATIENT GOALS: Patient would like to improve her flexibility and balance.   NEXT MD VISIT:    OBJECTIVE:    DIAGNOSTIC FINDINGS:  Radiology consult confirms lumbar scoliosis  with apex at L2-3, but since patient is asymptomatic, recommend monitoring it and continue to strengthen.           COGNITION: Overall cognitive status: Within functional limits for tasks assessed                         SENSATION: Not tested     MUSCLE LENGTH: Hamstrings: Right 40 deg; Left 30 deg     POSTURE: left pelvic obliquity, weight shift right, and hips rotated to L with L LE held in IR, increased R WB     LOWER EXTREMITY ROM: Trunk lateraly flexed due to scoliosis, L hip unable to achieve neutral ER, abd limited, B hamstrings tight, L ankle does not achieve neutral DF or inversion. LUE with limited ROM at all joints, in all planes. she wears a splint on her wrist at night.      LOWER EXTREMITY MMT: RLE  4/5, LLE with fluctuating tone throughout, HS very weak, hips also weak-MMT accuracy questionable due to her tone.   FUNCTIONAL TESTS:  5 times sit to stand: 14.77,  07/03/22 13 seconds, 09/30/22 = 12 seconds Timed up and go (TUG): 13.75 , 07/03/22 = 10 seconds, 09/30/22 9 seconds Berg Balance Scale: 48/56   GAIT: Distance walked: clinic distances Assistive device utilized: None Level of assistance: Modified independence Comments: Step-to pattern;Decreased step length - left;Decreased stance time - left;Decreased weight shift to left;Shuffle;Ataxic;Lateral hip instability;Trunk rotated posteriorly on left      TODAY'S TREATMENT:                                                                                                              DATE:  10/02/22 Bike level 4 x 5 minutes Gait outside today did not need rest as we walked around the back parking [employer] on airex Leg press 40# 2x10 ball b/n knees Then left leg only 2 x10 Sliding board supine hip abduction 2.5# on the ankle Hooklying left leg abduction against yellow tband Supine blue tband left leg extension Bridges Left leg ER and hold trying to keep toes to ceiling Sidelying abduction Supine straight leg abduction Left leg only bridges Passive stretch left LE  09/30/22 Nustep level 5 x 6 minutes Gait outside in the grass, curbs uneven ground SRA/CGA On airex side stepping Stairs step over step Volley ball Direction changes On airex cone toe touches Feet on ball K2C, rotations, bridges, isometric abs Yellow tband left hip abduction Active left hip ER Supine left hip extension green tband Crunches Passive stretch of the left LE  09/25/22 Nustep level 5 x 5 minutes Bike level 4 x 4 mintues Volley ball, volley ball on airex Side step on and off airex On airex cone toe touch On airex head turns On airex eyes closed Passive ROM of the HS, adductors, ER,Hip flexor and quad Supine bridge, bridge with  clamshell Yellow tband left hip abduction Active ER of the leg  09/23/22 Bike level 4 x 6 minutes Leg press 50#  x10 both legs, left only 20# 2x10 Leg curls 5# 3x10 left only Leg extension 5# 2x10 Volleyball Direction changes walking Standing on airex single arm pulls 5#, also did the left arm with some assist Bridges Yellow tband hip abduction left only Passive left LE stretches Gait in the grass outside  09/19/22 Bike L3 x52mins  NuStep L7 x12mins  Step reach and turn in // bars  Mat table quadruped Tall kneeling and reaching  Bridges 2x10  SLR 2x10  Passive sretching HS, adductors 30s  Tall kneeling hip flexor stretch 30s- needs help getting in position unable to get RLE in front  Tall kneeling sitting on heels as far as she can and back up x10 Ball squeeze with LAQ 2x10   09/16/22 NuStep L5 x35mins  Bike L3 x24mins  Side steps on airex x10 Standing on airex feet together 30s, then reaching outside BOS  Walking beam in parallel bars Passive stretching of LE  Heel slides and hip abd 2x10 LLE    09/11/22 Bike level 3 x 6 minutes Airex balance beam side stepping, tandem walking Step and reach with turn Side step on and off 2" step Volley ball, volley ball on airex Left LE stretch  09/04/22 Nustep level 5 x 6 minutes Gait outside uneven terrain, grass, slope and curbs On foam mat working on walking, bending and lifting, cone toe touches, figure 8's and then getting up from the floor Passive stretch left hip Active hip abduction in supine, use of sliding board with towel and 2# on the towel  09/02/22 Nustep level 5 x 6 minutes Gait outside SBA uneven terrain, curbs, grass Volleyball with movements 2.5# on the left leg LAQ, marches and hip abduction LEg press 40# 3x10 Side stepping, backward walking Partial sit ups Passive stretch of the left LE  08/28/22 Elliptical x 2 minutes with Mod A Leg press 20# both legs 2x10, then left only 2x10 Nustep level 5 x 6  minutes Gait outside uneven terrain, negotiating curbs Volleyball Passive stretch left LE Bridges Left hip abduction in hooklying red tband Active left LE ER  PATIENT EDUCATION:  Education details: impact spasticity and inflexibility has on gait and patella tracking during ambulation; encouraged increase in stretch; consider botox or other appropriate intervention with MD regarding spasticity (if needed); updates to HEP Person educated: Patient and Caregiver   Education method: Explanation Education comprehension: verbalized understanding   HOME EXERCISE PROGRAM: [email or web address]  Access Code: [email or web address] URL: [email or web address] Date: 07/17/2022 Prepared by: Stacie Glaze  Exercises - Supine Hip Internal and External Rotation  - 1 x daily - 7 x weekly - 2 sets - 10 reps - 3 hold - Supine Hip Abduction on Slider  - 1 x daily - 7 x weekly - 2 sets - 10 reps - 3 hold - Hooklying Clamshell with Resistance  - 1 x daily - 7 x weekly - 2 sets - 10 reps - 3 hold - Hip Abduction and Adduction Caregiver PROM  - 1 x daily - 7 x weekly - 1 sets - 5 reps - 30-60 hold - Caregiver PROM Hip Internal External Rotation  - 1 x daily - 7 x weekly - 1 sets - 5 reps - 30-60 hold - Supine Short Arc Quad  - 1 x daily - 7 x weekly - 2 sets - 10 reps - 3 hold ASSESSMENT:   CLINICAL IMPRESSION: I was able to see some thing that she has not been able to do, supine left hip abduction  with weight and was able to do some side lying hip abduction but went into flexion some, giving cues to go up and back was very hard but she could do a few  OBJECTIVE IMPAIRMENTS: Abnormal gait, decreased balance, decreased coordination, decreased endurance, decreased mobility, difficulty walking, decreased ROM, decreased strength, increased fascial restrictions, increased muscle spasms, impaired flexibility, impaired tone, improper body mechanics, and postural dysfunction.    ACTIVITY LIMITATIONS: standing, squatting,  stairs, transfers, and locomotion level   PARTICIPATION LIMITATIONS: shopping, community activity, and occupation   PERSONAL FACTORS: 1 comorbidity: CP  are also affecting patient's functional outcome.    REHAB POTENTIAL: Good   CLINICAL DECISION MAKING: Stable/uncomplicated   EVALUATION COMPLEXITY: Moderate     GOALS: Goals reviewed with patient? Yes   SHORT TERM GOALS: Target date: 05/01/22 I with initial HEP Baseline: Goal status: MET   LONG TERM GOALS: Target date: 10/14/22   I with final HEP Baseline:  Goal status: progressing with her and caregiver 08/19/22   2.  Improve TUG time to < 12 sec Baseline:  Goal status: met 08/14/22  3.  Improve 5 x STS to <12 Baseline:  Goal status:MET 06/05/22   4.  Increase BERG score to at least 44 Baseline:  Goal status: 45/56 MET   5.  Patient will ambulate with normalized gait pattern, minimizing abnormal weight shifts and increasing LLE position to more neutral hip rotation. Baseline:  Goal status: ongoing 08/28/22, progressing 10/02/22     PLAN:   PT FREQUENCY: 1-2x/week   PT DURATION: 10 weeks   PLANNED INTERVENTIONS: Therapeutic exercises, Therapeutic activity, Neuromuscular re-education, Balance training, Gait training, Patient/Family education, Self Care, Joint mobilization, Dry Needling, Cryotherapy, Moist heat, Ionotophoresis 4mg /ml Dexamethasone, and Manual therapy   PLAN FOR NEXT SESSION: work on hip ER and abduction as well as quad strength add more balance  1:11 PM,10/02/22 Stacie Glaze,  PT

## 2022-10-06 ENCOUNTER — Ambulatory Visit (INDEPENDENT_AMBULATORY_CARE_PROVIDER_SITE_OTHER): Payer: BC Managed Care – PPO | Admitting: Neurology

## 2022-10-06 ENCOUNTER — Encounter: Payer: Self-pay | Admitting: Neurology

## 2022-10-06 VITALS — BP 110/69 | HR 98 | Ht 64.75 in | Wt 110.6 lb

## 2022-10-06 DIAGNOSIS — R519 Headache, unspecified: Secondary | ICD-10-CM | POA: Diagnosis not present

## 2022-10-06 DIAGNOSIS — G802 Spastic hemiplegic cerebral palsy: Secondary | ICD-10-CM

## 2022-10-06 NOTE — Progress Notes (Signed)
[facility]    Provider:  Dr Lucia Gaskins Requesting Provider: Clayborne Dana, NP Primary Care Provider:  Clayborne Dana, NP  CC:  Headaches/migraines  10/06/2022: Been doing well, no headaches, think maybe it was a virus of some sort, she is walking , she had a lot of neck pain, She went to [facility]edic [facility], She has cute onset headache, mother is her and provides information, the last day she had a headache was the day of our appointment and since everything has resolved, She went to neck PT and massage and full of knots and that helped. Never used the [place] but carries it with her, importaton to take it as soon as possible.   Reviewed notes, labs and imaging from outside physicians, which showed:  1.  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 Cystic dilation of the right lateral ventricle as well. Atrophy of the right cerebral peduncle, and the corpus callosum as above.  2.  Associated decrease in size of the skull on the right and right enophthalmos  3.  No acute intracranial abnormality. (10 minutes reviewing mri images and comparing against prior MRI outsise of appointment)  Patient complains of symptoms per HPI as well as the following symptoms: none . Pertinent negatives and positives per HPI. All others negative none    HPI:  Kerri Robertson is a 22 y.o. female here as requested by Clayborne Dana, NP for headache. PMHx CP, scoliosis, syncope, CVA, anxiety, spasticity.  I reviewed emergency room notes from January 27 of this year, patient presented with headache, improved with migraine cocktail, and imaging was unremarkable.   There was a lot of stress. Mother had migraines and cousins since a child. Never really had headaches before. It was a Friday night and she was knee pain, that morning she woke with neck pain and thought she just slept on it wrong (mother provides much information as well). Head started hurting Jan 26th,  at 4pm she said she had a headache but went away, on the right side behind the eye and in the temple, constant pain and in the right side of the neck. At 730pm stopped eating, 830 the headache came back, vomiting, nausea, contant right-sided pain, photophobia, phonophobia, turning lights off helped and in a quiet room helped, pulsating, sleeping helped, stress made it worse. They went to the ER because it came back, she thre up the whole way to ER in the setting of a bad headache, always severe behind the right eye, no other symptoms or focal neurologic issues. She had a migraine cocktail and it resolved. Possibly knee could have been a trigger. She had another headache on Sunday and a slight fever (99.7), by 3pm she was fine this past Sunday, Mondat at 3pm she had another headache, neck pain, nausea, covid nehative, yesterday fine. Mondy she vomited from 3-10pm. So far fine today.  Worst headache of life, acute, intractable, very scary. Confusion/fogginess/cognitive changes. No other focal neurologic deficits, associated symptoms, inciting events or modifiable factors. 4 migraines a month, < 10 total h/a days a month.   Reviewed notes, labs and imaging from outside physicians, which showed:  05/31/2022: CTA H&N: EXAM: CT ANGIOGRAPHY HEAD   TECHNIQUE: Multidetector CT imaging of the head was performed using the standard protocol during bolus administration of intravenous contrast. Multiplanar CT image reconstructions and MIPs were obtained to evaluate the vascular anatomy.   RADIATION DOSE REDUCTION: This exam was performed according to the  departmental dose-optimization program which includes automated exposure control, adjustment of the mA and/or kV according to patient size and/or use of iterative reconstruction technique.   CONTRAST:  75mL OMNIPAQUE IOHEXOL 350 MG/ML SOLN   COMPARISON:  Head CT yesterday.  Brain MRI 03/15/2020.   FINDINGS: CTA NECK   Skeleton: Bony remodeling,  thickening of the right hemi-calvarium. No acute osseous abnormality identified.   Upper chest: Negative.   Other neck: Negative.   Aortic arch: 3 vessel arch configuration.  No arch atherosclerosis.   Right carotid system: Normal brachiocephalic artery and right CCA origin. Normal right CCA and right carotid bifurcation. Patent but somewhat diminutive appearing right ICA, normal to the skull base.   Left carotid system: Normal.  Left ICA is asymmetrically larger.   Vertebral arteries: Normal, left vertebral artery is dominant.   CTA HEAD   Posterior circulation: Dominant left V4 segment. Normal PICA origins. The right V4 segment is diminutive but contributes to the vertebrobasilar junction. Patent basilar artery without stenosis. Patent SCA and left PCA origin. Diminutive or absent right PCA. Posterior communicating arteries are diminutive or absent. Left PCA branches are within normal limits.   Anterior circulation: Both ICA siphons are patent to the carotid termini without plaque or stenosis, the left siphon is larger. Left MCA and right ACA origins are normal. Both the right MCA and left ACA are virtually absent. Azygous ACA anatomy results (series 12, image 16) supplied from the right ICA terminus. ACA branches are within normal limits. Left MCA M1 segment and bifurcation are patent without stenosis. Left MCA branches are within normal limits.   Venous sinuses: Patent.   Anatomic variants: Dominant left vertebral artery.   Review of the MIP images confirms the above findings   IMPRESSION: 1. No evidence of emergent large vessel occlusion. 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   2. No evidence of intracranial aneurysm. No atherosclerosis in the head or  neck.  12-27-2020:  20yoF referred for ambulatory EEG by Dr Baldo Daub to assess for  occult seizures. Pt has history of stares. Had previous  ambulatory EEG 2018 sowing abnormal right hemisphere patterns but  no seizures. MRI shows large area of encephalomalacia of right  hemisphere.    Start date and time 12-19-2020 13 44  End date and time 12-21-2020 08 55   Total duration of recording 43 hours 14 min   Description of Procedure  This is a digital video ambulatory EEG with 18 channels of EEG  and one channel of limited EKG, recorded using the standard 10-20  electrode placement and reviewed by standard montages of the  ACNS.  The patient underwent baseline recording at the time of  hookup to assess background and ensure initial integrity of  recording.  The patient was instructed to indicate times of  typical symptoms as test progressed, and a patient event diary  was not available for review.There were no technologist comments  recorded.   Description of Ambulatory EEG  The EEG begins with the patient awake and reveals background  rhythms of 8-9 Hz, 25-40uV activity which is maximal posteriorly.   This posterior dominant rhythm attenuates symmetrically with eye  opening. There is persistent  superimposed slowing at 4-5Hz  on  the right, at times with isolated sharply contoured waveforms  best seen at T4. As the patient drowses there is drop out of  alpha frequencies and central vertex waves appear, followed by  L>R  sleep spindles and diffuse slowing of deeper stages of  sleep.    Events  No marked events and no electrographic seizures. No diary  available for this review   MRI 03/15/2020:  CLINICAL DATA:  Seizure   EXAM:  MRI HEAD WITHOUT CONTRAST   TECHNIQUE:  Multiplanar, multiecho pulse sequences of the brain and surrounding  structures were obtained without intravenous contrast.   COMPARISON:  None.   FINDINGS:  Brain: There is cystic encephalomalacia of most  of the right  hemisphere. There is atrophy of rostrum of corpus callosum. No  abnormal diffusion restriction. The cerebellum is normal. The left  hemisphere appears normal. There is hyperintense T2-weighted signal  within the superior left periventricular white matter.   Vascular: Normal flow voids.   Skull and upper cervical spine: Normal marrow signal.   Sinuses/Orbits: Negative.   Other: None.   IMPRESSION:  1. No acute intracranial abnormality.  2. Cystic encephalomalacia of nearly the entire right cerebral  hemisphere.  3. 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       Latest Ref Rng & Units 05/31/2022    3:18 AM 06/09/2019    9:54 PM 04/15/2019   11:57 PM  CMP  Glucose 70 - 99 mg/dL 161  85  096   BUN 6 - 20 mg/dL 15  14  14    Creatinine 0.44 - 1.00 mg/dL 0.45  4.09  8.11   Sodium 135 - 145 mmol/L 136  138  138   Potassium 3.5 - 5.1 mmol/L 3.9  3.5  3.7   Chloride 98 - 111 mmol/L 102  105  99   CO2 22 - 32 mmol/L 25  25  27    Calcium 8.9 - 10.3 mg/dL 9.6  9.5  91.4   Total Protein 6.5 - 8.1 g/dL  7.7  9.2   Total Bilirubin 0.3 - 1.2 mg/dL  0.8  1.0   Alkaline Phos 47 - 119 U/L  73  79   AST 15 - 41 U/L  15  15   ALT 0 - 44 U/L  19  22        Latest Ref Rng & Units 03/17/2022    4:15 PM 06/09/2019    9:54 PM 04/15/2019   11:57 PM  CBC  WBC 4.5 - 13.5 K/uL 5.3  7.6  11.4   Hemoglobin 12.0 - 16.0 g/dL 78.2  95.6  21.3   Hematocrit 36.0 - 49.0 % 38.8  39.4  46.6   Platelets 150.0 - 575.0 K/uL 205.0  192  267      Review of Systems: Patient complains of symptoms per HPI as well as the following symptoms migraine. Pertinent negatives and positives per HPI. All others negative.   Social History   Socioeconomic History   Marital status: Single    Spouse name: Not on file   Number of children: Not on file   Years of education: Not on file   Highest education level:  Not on file  Occupational History   Not on file  Tobacco Use   Smoking status: Never   Smokeless tobacco: Never  Vaping Use   Vaping Use: Never used  Substance and Sexual Activity   Alcohol use: Not on file   Drug use: Never   Sexual activity: Not on file  Other Topics Concern   Not on file  Social History Narrative   Kerri Robertson is in the 12th grade at [employer]; she does well in school. She enjoys acting, tennis, bowling and singing. She lives with her mother and father.    Social Determinants of Health   Financial Resource Strain: Not on file  Food Insecurity: Not on file  Transportation Needs: Not on file  Physical Activity: Not on file  Stress: Not on file  Social Connections: Not on file  Intimate Partner Violence: Not on file    Family History  Problem Relation Age of Onset   Hypertension Mother    Migraines Mother    Depression Mother    Anxiety disorder Mother    Hypertension Father    Depression Father    Anxiety disorder Father    Hypertension Maternal Grandmother    Hypertension Maternal Grandfather    Heart disease Other    Diabetes Other    Seizures Neg Hx    Bipolar disorder Neg Hx    Schizophrenia Neg Hx    ADD / ADHD Neg Hx    Autism Neg Hx     Past Medical History:  Diagnosis Date   Allergy    Cerebral palsy ([name])    CP (cerebral palsy) ([name])    Stroke ([name])     Patient Active Problem List   Diagnosis Date Noted   Bilateral impacted cerumen 09/30/2022   Migraine without aura and without status migrainosus, not intractable 06/05/2022   CP (cerebral palsy) ([name]) 06/04/2022   Patellofemoral pain syndrome of left knee 06/03/2022   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     FEROSUL 325 (65 Fe) MG tablet Take 1 tablet (325 mg total) by mouth every morning. 90 tablet 1   Midazolam (NAYZILAM) 5 MG/0.1ML SOLN Use 1 spray in 1 nostril as needed for seizure lasting longer than 2 minutes or for seizure cluster.  May repeat in 5 minutes if needed. 2 each 3   Multiple Vitamin (MULTIVITAMIN WITH MINERALS) TABS tablet Take 1 tablet by mouth daily.     ondansetron (ZOFRAN) 4 MG tablet Take 1 tablet (4 mg total) by mouth every 8 (eight) hours as needed for nausea or vomiting. (Patient taking differently: Take 4 mg by mouth as needed for nausea or vomiting.) 20 tablet 0   promethazine (PHENERGAN) 25 MG suppository Place 1 suppository (25 mg total) rectally every 6 (six) hours as needed for nausea or vomiting. Take if vomiting and cannot take oral zofran or zofran doesn't work 12 each 11   propranolol (INDERAL) 10 MG tablet Take 0.5 tablets (5 mg total) by mouth 3 (three) times daily. (Patient taking differently: Take 5 mg by mouth 3 (three) times daily as needed.) 90 tablet 1   Ubrogepant (UBRELVY) 100 MG TABS Take 1 tablet (  100 mg total) by mouth every 2 (two) hours as needed. Maximum 200mg  a day. 16 tablet 11   No current facility-administered medications for this visit.    Allergies as of 10/06/2022 - Review Complete 10/06/2022  Allergen Reaction Noted   Benzodiazepines Nausea And Vomiting and Other (See Comments) 07/12/2009   Lactose Other (See Comments) 07/12/2009   Latex Itching and Other (See Comments) 08/21/2021    Vitals: BP 110/69   Pulse 98   Ht 5' 4.75" (1.645 m)   Wt 110 lb 9.6  oz (50.2 kg)   BMI 18.55 kg/m  Last Weight:  Wt Readings from Last 1 Encounters:  10/06/22 110 lb 9.6 oz (50.2 kg)   Last Height:   Ht Readings from Last 1 Encounters:  10/06/22 5' 4.75" (1.645 m)     Physical exam: repeated stanle Exam: Gen: NAD, conversant, well nourised, well groomed                     CV: RRR, no MRG. No Carotid Bruits. No peripheral edema, warm, nontender Eyes: Conjunctivae clear without exudates or hemorrhage  Neuro: repeated table Detailed Neurologic Exam  Speech:    Speech is normal; fluent and spontaneous with normal comprehension.  Cognition:    The patient is oriented to person, place, and time;     recent and remote memory intact;     language fluent;     normal attention, concentration,     fund of knowledge Cranial Nerves:    The pupils are equal, round, and reactive to light. The fundi are normal and spontaneous venous pulsations are present. Visual fields are full to finger confrontation. Extraocular movements are intact. Trigeminal sensation is intact and the muscles of mastication are normal. The face is symmetric(slight assymtry of lids right closed more but unlikely clinically significant). The palate elevates in the midline. Hearing intact. Voice is normal. Shoulder shrug is normal. The tongue has normal motion without fasciculations.   Coordination:    Normal finger to nose and heel to shin right, spasticit on the left.   Gait: spastic  Motor Observation:    no involuntary movements noted. Contracture left wrist and elbow Tone:    Normal muscle tone right, increased left   Posture:    Posture is normal. normal erect    Strength: left hemiparesis and spasticity, right strength is V/V in the upper and lower limbs.      Sensation: intact to LT     Reflex Exam:  DTR's:    Deep tendon reflexes in the upper and lower extremities are normal right, brisk on the left .   Toes:    The toes are upgoing left and equiv on the right.    Clonus:    Clonus is absent.    Assessment/Plan:  Probably new onset migraine without aura but given acutenes, severity and other concerning symptoms needs thorough evaluation. CTA H&N, MRI without acute findings    10/06/2022: Been doing well, no headaches, think maybe it was a virus of some sort, she is walking , she had a lot of neck pain, She went to [facility]edic at [facility], She has cute onset headache, mother is her and provides information, the last day she had a headache was the day of our appointment and since everything has resolved, She went to neck PT and massage and full of knots and that helped. Never used the [place] but carries it with her, importaton to take it  as soon as possible.   Reviewed notes, labs and imaging from outside physicians, which showed:  1.  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 Cystic dilation of the right lateral ventricle as well. Atrophy of the right cerebral peduncle, and the corpus callosum as above.  2.  Associated decrease in size of the skull on the right and right enophthalmos  3.  No acute intracranial abnormality.   Patient complains of symptoms per HPI as well as the following symptoms: none . Pertinent negatives and positives per HPI. All others negative none  Physical Therapy: Cervical myofascial pain, forward posture contributing to migraines and cervicalgia. Please evaluate and treat including dry needling, stretching, strengthening, manual therapy/massage, heating, TENS unit, exercising for scapular stabilization, pectoral stretching and rhomboid strengthening as clinically warranted as well as any other modality as recommended by evaluation.  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 I compared to old reports from 2021 and the MRIs are stable.  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 Try Bernita Raisin: Please take one tablet RIGHT at the onset of your headache. If it does not improve the symptoms please take one additional tablet. Do not take more then 2 tablets in 24hrs. Do not take use more then 2 to 3 times in a week. Can take with zofran or phenergan or excedrin.   No orders of the defined types were placed in this encounter.  No orders of the defined types were placed in this encounter.     Cc: Clayborne Dana, NP,  Clayborne Dana, NP  Naomie Dean, MD  [facility] [address] [place], [place] [address]  Phone [phone] Fax [phone]  I spent 25 minutes of face-to-face and non-face-to-face time with patient on the  1. Spastic hemiplegic cerebral palsy ([name])   2. Acute intractable headache, unspecified headache type    diagnosis.  This included previsit chart review, lab review, study review, order entry, electronic health record documentation, patient education on the different diagnostic and therapeutic options, counseling and coordination of care, risks and benefits of management, compliance, or risk factor reduction

## 2022-10-07 ENCOUNTER — Ambulatory Visit: Payer: BC Managed Care – PPO | Admitting: Family Medicine

## 2022-10-21 ENCOUNTER — Encounter: Payer: Self-pay | Admitting: Physical Therapy

## 2022-10-21 ENCOUNTER — Ambulatory Visit: Payer: BC Managed Care – PPO | Attending: Neurology | Admitting: Physical Therapy

## 2022-10-21 DIAGNOSIS — R2681 Unsteadiness on feet: Secondary | ICD-10-CM | POA: Diagnosis present

## 2022-10-21 DIAGNOSIS — R262 Difficulty in walking, not elsewhere classified: Secondary | ICD-10-CM | POA: Diagnosis present

## 2022-10-21 DIAGNOSIS — G801 Spastic diplegic cerebral palsy: Secondary | ICD-10-CM | POA: Insufficient documentation

## 2022-10-21 DIAGNOSIS — M6281 Muscle weakness (generalized): Secondary | ICD-10-CM | POA: Diagnosis present

## 2022-10-21 DIAGNOSIS — G800 Spastic quadriplegic cerebral palsy: Secondary | ICD-10-CM | POA: Diagnosis present

## 2022-10-21 DIAGNOSIS — R2689 Other abnormalities of gait and mobility: Secondary | ICD-10-CM | POA: Diagnosis present

## 2022-10-21 NOTE — Therapy (Signed)
OUTPATIENT PHYSICAL THERAPY TREATMENT  Patient Name: Kerri Robertson MRN: [id number] DOB:2000/06/17, 22 y.o., female Today's Date: 04/10/2022   END OF SESSION:  PT End of Session - 10/21/22 1745     Visit Number 35    Date for PT Re-Evaluation 12/21/22    PT Start Time 1744    PT Stop Time 1830    PT Time Calculation (min) 46 min    Activity Tolerance Patient tolerated treatment well    Behavior During Therapy WFL for tasks assessed/performed                  Past Medical History:  Diagnosis Date   Allergy     Cerebral palsy ([name])     Stroke [facility])           Past Surgical History:  Procedure Laterality Date   ARM SURGERY        2013   FOOT SURGERY        2010   HAMSTRING LENGTHENING       TIBIA / FIBIA LENGTHENING            Patient Active Problem List    Diagnosis Date Noted   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   Balance problem 05/06/2018   Syncope 05/06/2018   History of cerebrovascular accident 11/18/2017   CP (cerebral palsy), spastic, 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[name]) 06/25/2017   Anxiety state 06/25/2017   Spasticity 06/25/2017   Allergic rhinitis 10/21/2016   Mild intermittent asthma 10/21/2016      PCP: Clayborne Dana, NP   REFERRING PROVIDER: Clayborne Dana, NP   REFERRING DIAG: G80.0 (ICD-10-CM) - CP (cerebral palsy), spastic, 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[name])    THERAPY DIAG:  Spastic quadriplegic cerebral palsy ([name])   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)   Other lack of coordination   Unsteadiness on feet   Difficulty in walking, not elsewhere classified   Left-sided weakness   Muscle weakness (generalized)   Abnormal posture   Cerebral palsy with spastic diplegia ([name])   Rationale for Evaluation and Treatment: Rehabilitation   ONSET DATE: 03/17/22   SUBJECTIVE:    SUBJECTIVE STATEMENT: I spoke with [name] and her mom  today, they feel that she has been doing well, less stumbles and no falls recently, she is in summer camp now without much issue, just a little easy to fatigue and then once fatigued she is a little more off balance PERTINENT HISTORY:     CP (cerebral palsy), spastic, 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[name]) - Primary         She is stable, at baseline (ambulatory, but with with some gait abnormalities, balance issues, and weakness). Rechecking CBC, iron, and vitamin D today. She has been on supplementation (refilling iron for her). Will make additional changes based on labs if indicated. PT referral placed to [facility] at her request. No acute concerns today.      PAIN:  Are you having pain? No   PRECAUTIONS: None   WEIGHT BEARING RESTRICTIONS: No   FALLS:  Has patient fallen in last 6 months? Yes. 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 Hobbies- Runner, broadcasting/film/video, archery, baseball, tennis, dance    PLOF: Needs assistance with ADLs   PATIENT GOALS: Patient would like to  improve her flexibility and balance.   NEXT MD VISIT:    OBJECTIVE:    DIAGNOSTIC FINDINGS:  Radiology consult confirms lumbar scoliosis with apex at L2-3, but since patient is asymptomatic, recommend monitoring it and continue to strengthen.           COGNITION: Overall cognitive status: Within functional limits for tasks assessed                         SENSATION: Not tested     MUSCLE LENGTH: Hamstrings: Right 40 deg; Left 30 deg     POSTURE: left pelvic obliquity, weight shift right, and hips rotated to L with L LE held in IR, increased R WB     LOWER EXTREMITY ROM: Trunk lateraly flexed due to scoliosis, L hip unable to achieve neutral ER, abd limited, B hamstrings tight, L ankle does not achieve neutral DF or inversion. LUE with  limited ROM at all joints, in all planes. she wears a splint on her wrist at night.      LOWER EXTREMITY MMT: RLE 4/5, LLE with fluctuating tone throughout, HS very weak, hips also weak-MMT accuracy questionable due to her tone.   FUNCTIONAL TESTS:  5 times sit to stand: 14.77,  07/03/22 13 seconds, 09/30/22 = 12 seconds Timed up and go (TUG): 13.75 , 07/03/22 = 10 seconds, 09/30/22 9 seconds Berg Balance Scale: 48/56  10/21/22 = 49/56   GAIT: Distance walked: clinic distances Assistive device utilized: None Level of assistance: Modified independence Comments: Step-to pattern;Decreased step length - left;Decreased stance time - left;Decreased weight shift to left;Shuffle;Ataxic;Lateral hip instability;Trunk rotated posteriorly on left      TODAY'S TREATMENT:                                                                                                              DATE:  10/21/22 Nustep level 5 x 6 minutes Berg 49/56 TUG 10 seconds 6" alternating toe touches Right foot propped up on the 6" step and trying to balance on the left needing min-mod A Side stepping, fwd and backward walking Leg press 40# both legs, left only 20# and then 30# On airex ball taps back and forth Standing on bosu upside down in pbars with Min A Supine passive left hip stretches Side lying clams and hip abduction with assist Rocker board two ways in pbars Gait outside curbs, slopes grass Bridge and then left leg bridge  10/02/22 Bike level 4 x 5 minutes Gait outside today did not need rest as we walked around the back parking [employer] on airex Leg press 40# 2x10 ball b/n knees Then left leg only 2 x10 Sliding board supine hip abduction 2.5# on the ankle Hooklying left leg abduction against yellow tband Supine blue tband left leg extension Bridges Left leg ER and hold trying to keep toes to ceiling Sidelying abduction Supine straight leg abduction Left leg only bridges Passive stretch left  LE  09/30/22 Nustep level 5 x 6 minutes Gait outside in  the grass, curbs uneven ground SRA/CGA On airex side stepping Stairs step over step Volley ball Direction changes On airex cone toe touches Feet on ball K2C, rotations, bridges, isometric abs Yellow tband left hip abduction Active left hip ER Supine left hip extension green tband Crunches Passive stretch of the left LE  09/25/22 Nustep level 5 x 5 minutes Bike level 4 x 4 mintues Volley ball, volley ball on airex Side step on and off airex On airex cone toe touch On airex head turns On airex eyes closed Passive ROM of the HS, adductors, ER,Hip flexor and quad Supine bridge, bridge with clamshell Yellow tband left hip abduction Active ER of the leg  09/23/22 Bike level 4 x 6 minutes Leg press 50# x10 both legs, left only 20# 2x10 Leg curls 5# 3x10 left only Leg extension 5# 2x10 Volleyball Direction changes walking Standing on airex single arm pulls 5#, also did the left arm with some assist Bridges Yellow tband hip abduction left only Passive left LE stretches Gait in the grass outside  09/19/22 Bike L3 x84mins  NuStep L7 x43mins  Step reach and turn in // bars  Mat table quadruped Tall kneeling and reaching  Bridges 2x10  SLR 2x10  Passive sretching HS, adductors 30s  Tall kneeling hip flexor stretch 30s- needs help getting in position unable to get RLE in front  Tall kneeling sitting on heels as far as she can and back up x10 Ball squeeze with LAQ 2x10   09/16/22 NuStep L5 x54mins  Bike L3 x69mins  Side steps on airex x10 Standing on airex feet together 30s, then reaching outside BOS  Walking beam in parallel bars Passive stretching of LE  Heel slides and hip abd 2x10 LLE    09/11/22 Bike level 3 x 6 minutes Airex balance beam side stepping, tandem walking Step and reach with turn Side step on and off 2" step Volley ball, volley ball on airex Left LE stretch  09/04/22 Nustep level 5 x 6  minutes Gait outside uneven terrain, grass, slope and curbs On foam mat working on walking, bending and lifting, cone toe touches, figure 8's and then getting up from the floor Passive stretch left hip Active hip abduction in supine, use of sliding board with towel and 2# on the towel PATIENT EDUCATION:  Education details: impact spasticity and inflexibility has on gait and patella tracking during ambulation; encouraged increase in stretch; consider botox or other appropriate intervention with MD regarding spasticity (if needed); updates to HEP Person educated: Patient and Caregiver   Education method: Explanation Education comprehension: verbalized understanding   HOME EXERCISE PROGRAM: [email or web address]  Access Code: [email or web address] URL: [email or web address] Date: 07/17/2022 Prepared by: Stacie Glaze  Exercises - Supine Hip Internal and External Rotation  - 1 x daily - 7 x weekly - 2 sets - 10 reps - 3 hold - Supine Hip Abduction on Slider  - 1 x daily - 7 x weekly - 2 sets - 10 reps - 3 hold - Hooklying Clamshell with Resistance  - 1 x daily - 7 x weekly - 2 sets - 10 reps - 3 hold - Hip Abduction and Adduction Caregiver PROM  - 1 x daily - 7 x weekly - 1 sets - 5 reps - 30-60 hold - Caregiver PROM Hip Internal External Rotation  - 1 x daily - 7 x weekly - 1 sets - 5 reps - 30-60 hold - Supine Short Arc Quad  - 1 x daily - 7 x  weekly - 2 sets - 10 reps - 3 hold ASSESSMENT:   CLINICAL IMPRESSION: Pateint has not been to see [place] in about 3 weeks.  Reports no falls, reports that she is in a summer camp and doing okay.  She is very good with the TUG and 5X STS, she improved a little in the Berg balance but is very high, she does not like being on the left LE.  I added more activities with the rocker board and the bosu, and tried having her right leg on 6" step, very difficult for her to do  OBJECTIVE IMPAIRMENTS: Abnormal gait, decreased balance, decreased coordination, decreased  endurance, decreased mobility, difficulty walking, decreased ROM, decreased strength, increased fascial restrictions, increased muscle spasms, impaired flexibility, impaired tone, improper body mechanics, and postural dysfunction.    ACTIVITY LIMITATIONS: standing, squatting, stairs, transfers, and locomotion level   PARTICIPATION LIMITATIONS: shopping, community activity, and occupation   PERSONAL FACTORS: 1 comorbidity: CP  are also affecting patient's functional outcome.    REHAB POTENTIAL: Good   CLINICAL DECISION MAKING: Stable/uncomplicated   EVALUATION COMPLEXITY: Moderate     GOALS: Goals reviewed with patient? Yes   SHORT TERM GOALS: Target date: 05/01/22 I with initial HEP Baseline: Goal status: MET   LONG TERM GOALS: Target date: 10/14/22   I with final HEP Baseline:  Goal status: progressing with her and caregiver 08/19/22   2.  Improve TUG time to < 12 sec Baseline:  Goal status: met 08/14/22  3.  Improve 5 x STS to <12 Baseline:  Goal status:MET 06/05/22   4.  Increase BERG score to at least 44 Baseline:  Goal status: 45/56 MET   5.  Patient will ambulate with normalized gait pattern, minimizing abnormal weight shifts and increasing LLE position to more neutral hip rotation. Baseline:  Goal status: ongoing 08/28/22, progressing 10/02/22     PLAN:   PT FREQUENCY: 1-2x/week   PT DURATION: 10 weeks   PLANNED INTERVENTIONS: Therapeutic exercises, Therapeutic activity, Neuromuscular re-education, Balance training, Gait training, Patient/Family education, Self Care, Joint mobilization, Dry Needling, Cryotherapy, Moist heat, Ionotophoresis 4mg /ml Dexamethasone, and Manual therapy   PLAN FOR NEXT SESSION: work on hip ER and abduction as well as quad strength add more balance  5:48 PM,10/21/22 Stacie Glaze,  PT

## 2022-10-23 ENCOUNTER — Ambulatory Visit: Payer: BC Managed Care – PPO

## 2022-10-23 DIAGNOSIS — M6281 Muscle weakness (generalized): Secondary | ICD-10-CM | POA: Diagnosis not present

## 2022-10-23 DIAGNOSIS — R2681 Unsteadiness on feet: Secondary | ICD-10-CM

## 2022-10-23 DIAGNOSIS — G801 Spastic diplegic cerebral palsy: Secondary | ICD-10-CM

## 2022-10-23 DIAGNOSIS — G800 Spastic quadriplegic cerebral palsy: Secondary | ICD-10-CM

## 2022-10-23 DIAGNOSIS — R262 Difficulty in walking, not elsewhere classified: Secondary | ICD-10-CM

## 2022-10-23 DIAGNOSIS — R2689 Other abnormalities of gait and mobility: Secondary | ICD-10-CM

## 2022-10-23 NOTE — Therapy (Signed)
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   Balance problem 05/06/2018   Syncope 05/06/2018   History of cerebrovascular accident 11/18/2017   CP (cerebral palsy), spastic, 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[name]) 06/25/2017   Anxiety state 06/25/2017   Spasticity 06/25/2017   Allergic rhinitis 10/21/2016   Mild intermittent asthma 10/21/2016      PCP: Clayborne Dana, NP   REFERRING PROVIDER: Clayborne Dana, NP   REFERRING DIAG: G80.0 (ICD-10-CM) - CP (cerebral palsy), spastic, 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[name])    THERAPY DIAG:  Spastic quadriplegic cerebral palsy ([name])   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   PERTINENT HISTORY:     CP (cerebral palsy), spastic, 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[name]) - Primary         She is stable, at baseline (ambulatory, but with with some gait abnormalities, balance issues, and weakness). Rechecking CBC, iron, and vitamin D today. She has been on supplementation  (refilling iron for her). Will make additional changes based on labs if indicated. PT referral placed to [facility] at her request. No acute concerns today.      PAIN:  Are you having pain? No   PRECAUTIONS: None   WEIGHT BEARING RESTRICTIONS: No   FALLS:  Has patient fallen in last 6 months? Yes. 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 Hobbies- Runner, broadcasting/film/video, archery, baseball, tennis, dance    PLOF: Needs assistance with ADLs   PATIENT GOALS: Patient would like to improve her flexibility and balance.   NEXT MD VISIT:    OBJECTIVE:    DIAGNOSTIC FINDINGS:  Radiology consult confirms lumbar scoliosis with apex at L2-3, but since patient is asymptomatic, recommend monitoring it and continue to strengthen.           COGNITION: Overall cognitive status: Within functional limits for tasks assessed                         SENSATION: Not tested     MUSCLE LENGTH: Hamstrings: Right 40 deg; Left 30 deg     POSTURE: left pelvic obliquity,  weight shift right, and hips rotated to L with L LE held in IR, increased R WB     LOWER EXTREMITY ROM: Trunk lateraly flexed due to scoliosis, L hip unable to achieve neutral ER, abd limited, B hamstrings tight, L ankle does not achieve neutral DF or inversion. LUE with limited ROM at all joints, in all planes. she wears a splint on her wrist at night.      LOWER EXTREMITY MMT: RLE 4/5, LLE with fluctuating tone throughout, HS very weak, hips also weak-MMT accuracy questionable due to her tone.   FUNCTIONAL TESTS:  5 times sit to stand: 14.77,  07/03/22 13 seconds, 09/30/22 = 12 seconds Timed up and go (TUG): 13.75 , 07/03/22 = 10 seconds, 09/30/22 9 seconds Berg Balance Scale: 48/56  10/21/22 = 49/56   GAIT: Distance walked: clinic  distances Assistive device utilized: None Level of assistance: Modified independence Comments: Step-to pattern;Decreased step length - left;Decreased stance time - left;Decreased weight shift to left;Shuffle;Ataxic;Lateral hip instability;Trunk rotated posteriorly on left      TODAY'S TREATMENT:                                                                                                              DATE:  10/23/22 NuStep L5 x29mins  Hip bridges 2x10  Prone hip ext RLE 2x10  Passive stretching oh LE  Lunges on BOSU 20 reps alt in II bars  Standing on BOSU rocking back and forth and side to side  Volleyball hits with steps  5# AW marching 20 reps alt  LAQ 5# 2x10  Side steps with 5# on [facility]    10/21/22 Nustep level 5 x 6 minutes Berg 49/56 TUG 10 seconds 6" alternating toe touches Right foot propped up on the 6" step and trying to balance on the left needing min-mod A Side stepping, fwd and backward walking Leg press 40# both legs, left only 20# and then 30# On airex ball taps back and forth Standing on bosu upside down in pbars with Min A Supine passive left hip stretches Side lying clams and hip abduction with assist Rocker board two ways in pbars Gait outside curbs, slopes grass Bridge and then left leg bridge  10/02/22 Bike level 4 x 5 minutes Gait outside today did not need rest as we walked around the back parking [employer] on airex Leg press 40# 2x10 ball b/n knees Then left leg only 2 x10 Sliding board supine hip abduction 2.5# on the ankle Hooklying left leg abduction against yellow tband Supine blue tband left leg extension Bridges Left leg ER and hold trying to keep toes to ceiling Sidelying abduction Supine straight leg abduction Left leg only bridges Passive stretch left LE  09/30/22 Nustep level 5 x 6 minutes Gait outside in the grass, curbs uneven ground SRA/CGA On airex side stepping Stairs step over step Volley ball Direction  changes On airex cone toe touches Feet on ball K2C, rotations, bridges, isometric abs Yellow tband left hip abduction Active left hip ER Supine left  hip extension green tband Crunches Passive stretch of the left LE  09/25/22 Nustep level 5 x 5 minutes Bike level 4 x 4 mintues Volley ball, volley ball on airex Side step on and off airex On airex cone toe touch On airex head turns On airex eyes closed Passive ROM of the HS, adductors, ER,Hip flexor and quad Supine bridge, bridge with clamshell Yellow tband left hip abduction Active ER of the leg  09/23/22 Bike level 4 x 6 minutes Leg press 50# x10 both legs, left only 20# 2x10 Leg curls 5# 3x10 left only Leg extension 5# 2x10 Volleyball Direction changes walking Standing on airex single arm pulls 5#, also did the left arm with some assist Bridges Yellow tband hip abduction left only Passive left LE stretches Gait in the grass outside  09/19/22 Bike L3 x56mins  NuStep L7 x9mins  Step reach and turn in // bars  Mat table quadruped Tall kneeling and reaching  Bridges 2x10  SLR 2x10  Passive sretching HS, adductors 30s  Tall kneeling hip flexor stretch 30s- needs help getting in position unable to get RLE in front  Tall kneeling sitting on heels as far as she can and back up x10 Ball squeeze with LAQ 2x10   09/16/22 NuStep L5 x33mins  Bike L3 x9mins  Side steps on airex x10 Standing on airex feet together 30s, then reaching outside BOS  Walking beam in parallel bars Passive stretching of LE  Heel slides and hip abd 2x10 LLE    09/11/22 Bike level 3 x 6 minutes Airex balance beam side stepping, tandem walking Step and reach with turn Side step on and off 2" step Volley ball, volley ball on airex Left LE stretch  09/04/22 Nustep level 5 x 6 minutes Gait outside uneven terrain, grass, slope and curbs On foam mat working on walking, bending and lifting, cone toe touches, figure 8's and then getting up from the  floor Passive stretch left hip Active hip abduction in supine, use of sliding board with towel and 2# on the towel PATIENT EDUCATION:  Education details: impact spasticity and inflexibility has on gait and patella tracking during ambulation; encouraged increase in stretch; consider botox or other appropriate intervention with MD regarding spasticity (if needed); updates to HEP Person educated: Patient and Caregiver   Education method: Explanation Education comprehension: verbalized understanding   HOME EXERCISE PROGRAM: [email or web address]  Access Code: [email or web address] URL: [email or web address] Date: 07/17/2022 Prepared by: Stacie Glaze  Exercises - Supine Hip Internal and External Rotation  - 1 x daily - 7 x weekly - 2 sets - 10 reps - 3 hold - Supine Hip Abduction on Slider  - 1 x daily - 7 x weekly - 2 sets - 10 reps - 3 hold - Hooklying Clamshell with Resistance  - 1 x daily - 7 x weekly - 2 sets - 10 reps - 3 hold - Hip Abduction and Adduction Caregiver PROM  - 1 x daily - 7 x weekly - 1 sets - 5 reps - 30-60 hold - Caregiver PROM Hip Internal External Rotation  - 1 x daily - 7 x weekly - 1 sets - 5 reps - 30-60 hold - Supine Short Arc Quad  - 1 x daily - 7 x weekly - 2 sets - 10 reps - 3 hold ASSESSMENT:   CLINICAL IMPRESSION: Patient reports no falls, reports that she is in a summer camp and doing well. Continued with some higher level balance activities, needs help with stepping up  on BOSU. Does well with 5# AW, states they feel more heavy on her R side than her left.   OBJECTIVE IMPAIRMENTS: Abnormal gait, decreased balance, decreased coordination, decreased endurance, decreased mobility, difficulty walking, decreased ROM, decreased strength, increased fascial restrictions, increased muscle spasms, impaired flexibility, impaired tone, improper body mechanics, and postural dysfunction.    ACTIVITY LIMITATIONS: standing, squatting, stairs, transfers, and locomotion level    PARTICIPATION LIMITATIONS: shopping, community activity, and occupation   PERSONAL FACTORS: 1 comorbidity: CP  are also affecting patient's functional outcome.    REHAB POTENTIAL: Good   CLINICAL DECISION MAKING: Stable/uncomplicated   EVALUATION COMPLEXITY: Moderate     GOALS: Goals reviewed with patient? Yes   SHORT TERM GOALS: Target date: 05/01/22 I with initial HEP Baseline: Goal status: MET   LONG TERM GOALS: Target date: 10/14/22   I with final HEP Baseline:  Goal status: progressing with her and caregiver 08/19/22   2.  Improve TUG time to < 12 sec Baseline:  Goal status: met 08/14/22  3.  Improve 5 x STS to <12 Baseline:  Goal status:MET 06/05/22   4.  Increase BERG score to at least 44 Baseline:  Goal status: 45/56 MET   5.  Patient will ambulate with normalized gait pattern, minimizing abnormal weight shifts and increasing LLE position to more neutral hip rotation. Baseline:  Goal status: ongoing 08/28/22, progressing 10/02/22     PLAN:   PT FREQUENCY: 1-2x/week   PT DURATION: 10 weeks   PLANNED INTERVENTIONS: Therapeutic exercises, Therapeutic activity, Neuromuscular re-education, Balance training, Gait training, Patient/Family education, Self Care, Joint mobilization, Dry Needling, Cryotherapy, Moist heat, Ionotophoresis 4mg /ml Dexamethasone, and Manual therapy   PLAN FOR NEXT SESSION: work on hip ER and abduction as well as quad strength add more balance  9:18 AM,10/23/22 Stacie Glaze,  PT

## 2022-10-28 ENCOUNTER — Ambulatory Visit: Payer: BC Managed Care – PPO | Admitting: Physical Therapy

## 2022-10-28 ENCOUNTER — Encounter: Payer: Self-pay | Admitting: Physical Therapy

## 2022-10-28 DIAGNOSIS — G800 Spastic quadriplegic cerebral palsy: Secondary | ICD-10-CM

## 2022-10-28 DIAGNOSIS — M6281 Muscle weakness (generalized): Secondary | ICD-10-CM

## 2022-10-28 DIAGNOSIS — R262 Difficulty in walking, not elsewhere classified: Secondary | ICD-10-CM

## 2022-10-28 DIAGNOSIS — G801 Spastic diplegic cerebral palsy: Secondary | ICD-10-CM

## 2022-10-28 DIAGNOSIS — R2681 Unsteadiness on feet: Secondary | ICD-10-CM

## 2022-10-28 DIAGNOSIS — R2689 Other abnormalities of gait and mobility: Secondary | ICD-10-CM

## 2022-10-28 NOTE — Therapy (Signed)
OUTPATIENT PHYSICAL THERAPY TREATMENT  Patient Name: Kerri Robertson MRN: [id number] DOB:[birth year], 22 y.o., female Today's Date: 04/10/2022   END OF SESSION:  PT End of Session - 10/28/22 1750     Visit Number [id number]    Date for PT Re-Evaluation 12/21/22    PT Start Time 1745    PT Stop Time 1830    PT Time Calculation (min) 45 min    Activity Tolerance Patient tolerated treatment well    Behavior During Therapy WFL for tasks assessed/performed                   Past Medical History:  Diagnosis Date   Allergy     Cerebral palsy ([name])     Stroke [facility])           Past Surgical History:  Procedure Laterality Date   ARM SURGERY        2013   FOOT SURGERY        2010   HAMSTRING LENGTHENING       TIBIA / FIBIA LENGTHENING            Patient Active Problem List    Diagnosis Date Noted   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   Balance problem 05/06/2018   Syncope 05/06/2018   History of cerebrovascular accident 11/18/2017   CP (cerebral palsy), spastic, 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[name]) 06/25/2017   Anxiety state 06/25/2017   Spasticity 06/25/2017   Allergic rhinitis 10/21/2016   Mild intermittent asthma 10/21/2016      PCP: Clayborne Dana, NP   REFERRING PROVIDER: Clayborne Dana, NP   REFERRING DIAG: G80.0 (ICD-10-CM) - CP (cerebral palsy), spastic, 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[name])    THERAPY DIAG:  Spastic quadriplegic cerebral palsy ([name])   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)   Other lack of coordination   Unsteadiness on feet   Difficulty in walking, not elsewhere classified   Left-sided weakness   Muscle weakness (generalized)   Abnormal posture   Cerebral palsy with spastic diplegia ([name])   Rationale for Evaluation and Treatment: Rehabilitation   ONSET DATE: 03/17/22   SUBJECTIVE:    SUBJECTIVE STATEMENT: Had a fall last week, has bruise  on the right knee, mom reports that it was a pretty hard fall  PERTINENT HISTORY:     CP (cerebral palsy), spastic, 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[name]) - Primary         She is stable, at baseline (ambulatory, but with with some gait abnormalities, balance issues, and weakness). Rechecking CBC, iron, and vitamin D today. She has been on supplementation (refilling iron for her). Will make additional changes based on labs if indicated. PT referral placed to [facility] at her request. No acute concerns today.      PAIN:  Are you having pain? No   PRECAUTIONS: None   WEIGHT BEARING RESTRICTIONS: No   FALLS:  Has patient fallen in last 6 months? Yes. 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 Hobbies- Runner, broadcasting/film/video, archery, baseball, tennis, dance    PLOF: Needs assistance with ADLs   PATIENT GOALS: Patient would like to improve her flexibility and balance.   NEXT MD VISIT:    OBJECTIVE:    DIAGNOSTIC FINDINGS:  Radiology consult confirms lumbar scoliosis with  apex at L2-3, but since patient is asymptomatic, recommend monitoring it and continue to strengthen.           COGNITION: Overall cognitive status: Within functional limits for tasks assessed                         SENSATION: Not tested     MUSCLE LENGTH: Hamstrings: Right 40 deg; Left 30 deg     POSTURE: left pelvic obliquity, weight shift right, and hips rotated to L with L LE held in IR, increased R WB     LOWER EXTREMITY ROM: Trunk lateraly flexed due to scoliosis, L hip unable to achieve neutral ER, abd limited, B hamstrings tight, L ankle does not achieve neutral DF or inversion. LUE with limited ROM at all joints, in all planes. she wears a splint on her wrist at night.      LOWER EXTREMITY MMT: RLE 4/5, LLE with fluctuating tone  throughout, HS very weak, hips also weak-MMT accuracy questionable due to her tone.   FUNCTIONAL TESTS:  5 times sit to stand: 14.77,  07/03/22 13 seconds, 09/30/22 = 12 seconds Timed up and go (TUG): 13.75 , 07/03/22 = 10 seconds, 09/30/22 9 seconds Berg Balance Scale: 48/56  10/21/22 = 49/56   GAIT: Distance walked: clinic distances Assistive device utilized: None Level of assistance: Modified independence Comments: Step-to pattern;Decreased step length - left;Decreased stance time - left;Decreased weight shift to left;Shuffle;Ataxic;Lateral hip instability;Trunk rotated posteriorly on left      TODAY'S TREATMENT:                                                                                                              DATE:  10/28/22 Nustep level 5 x 6 minutes Side step on and off airex On bosu balancing 5# marches 2x10 5# hip abduction 2x10 Direction changes Volleyball Supine bridges and then bridges with PT holding the left leg up Crunches Passive stretch to the left hip Leg press 20# 2x10 each leg Leg extension 5# left x 10, then both x10 Cone toe touches Ball kicks  10/23/22 NuStep L5 x62mins  Hip bridges 2x10  Prone hip ext RLE 2x10  Passive stretching oh LE  Lunges on BOSU 20 reps alt in II bars  Standing on BOSU rocking back and forth and side to side  Volleyball hits with steps  5# AW marching 20 reps alt  LAQ 5# 2x10  Side steps with 5# on [facility]    10/21/22 Nustep level 5 x 6 minutes Berg 49/56 TUG 10 seconds 6" alternating toe touches Right foot propped up on the 6" step and trying to balance on the left needing min-mod A Side stepping, fwd and backward walking Leg press 40# both legs, left only 20# and then 30# On airex ball taps back and forth Standing on bosu upside down in pbars with Min A Supine passive left hip stretches Side lying clams and hip abduction with assist Rocker board two ways in pbars Gait outside curbs,  slopes grass Bridge and then  left leg bridge  10/02/22 Bike level 4 x 5 minutes Gait outside today did not need rest as we walked around the back parking [employer] on airex Leg press 40# 2x10 ball b/n knees Then left leg only 2 x10 Sliding board supine hip abduction 2.5# on the ankle Hooklying left leg abduction against yellow tband Supine blue tband left leg extension Bridges Left leg ER and hold trying to keep toes to ceiling Sidelying abduction Supine straight leg abduction Left leg only bridges Passive stretch left LE  09/30/22 Nustep level 5 x 6 minutes Gait outside in the grass, curbs uneven ground SRA/CGA On airex side stepping Stairs step over step Volley ball Direction changes On airex cone toe touches Feet on ball K2C, rotations, bridges, isometric abs Yellow tband left hip abduction Active left hip ER Supine left hip extension green tband Crunches Passive stretch of the left LE  09/25/22 Nustep level 5 x 5 minutes Bike level 4 x 4 mintues Volley ball, volley ball on airex Side step on and off airex On airex cone toe touch On airex head turns On airex eyes closed Passive ROM of the HS, adductors, ER,Hip flexor and quad Supine bridge, bridge with clamshell Yellow tband left hip abduction Active ER of the leg  09/23/22 Bike level 4 x 6 minutes Leg press 50# x10 both legs, left only 20# 2x10 Leg curls 5# 3x10 left only Leg extension 5# 2x10 Volleyball Direction changes walking Standing on airex single arm pulls 5#, also did the left arm with some assist Bridges Yellow tband hip abduction left only Passive left LE stretches Gait in the grass outside  09/19/22 Bike L3 x22mins  NuStep L7 x55mins  Step reach and turn in // bars  Mat table quadruped Tall kneeling and reaching  Bridges 2x10  SLR 2x10  Passive sretching HS, adductors 30s  Tall kneeling hip flexor stretch 30s- needs help getting in position unable to get RLE in front  Tall kneeling sitting on heels as far as  she can and back up x10 Ball squeeze with LAQ 2x10   09/16/22 NuStep L5 x94mins  Bike L3 x69mins  Side steps on airex x10 Standing on airex feet together 30s, then reaching outside BOS  Walking beam in parallel bars Passive stretching of LE  Heel slides and hip abd 2x10 LLE    09/11/22 Bike level 3 x 6 minutes Airex balance beam side stepping, tandem walking Step and reach with turn Side step on and off 2" step Volley ball, volley ball on airex Left LE stretch  09/04/22 Nustep level 5 x 6 minutes Gait outside uneven terrain, grass, slope and curbs On foam mat working on walking, bending and lifting, cone toe touches, figure 8's and then getting up from the floor Passive stretch left hip Active hip abduction in supine, use of sliding board with towel and 2# on the towel PATIENT EDUCATION:  Education details: impact spasticity and inflexibility has on gait and patella tracking during ambulation; encouraged increase in stretch; consider botox or other appropriate intervention with MD regarding spasticity (if needed); updates to HEP Person educated: Patient and Caregiver   Education method: Explanation Education comprehension: verbalized understanding   HOME EXERCISE PROGRAM: [email or web address]  Access Code: [email or web address] URL: [email or web address] Date: 07/17/2022 Prepared by: Stacie Glaze  Exercises - Supine Hip Internal and External Rotation  - 1 x daily - 7 x weekly - 2 sets - 10 reps - 3 hold - Supine Hip  Abduction on Slider  - 1 x daily - 7 x weekly - 2 sets - 10 reps - 3 hold - Hooklying Clamshell with Resistance  - 1 x daily - 7 x weekly - 2 sets - 10 reps - 3 hold - Hip Abduction and Adduction Caregiver PROM  - 1 x daily - 7 x weekly - 1 sets - 5 reps - 30-60 hold - Caregiver PROM Hip Internal External Rotation  - 1 x daily - 7 x weekly - 1 sets - 5 reps - 30-60 hold - Supine Short Arc Quad  - 1 x daily - 7 x weekly - 2 sets - 10 reps - 3 hold ASSESSMENT:   CLINICAL  IMPRESSION: Patient had a fall, she has  a bruise denies any pain, the fall occurred last week at camp, I did some focus on trying to get left leg only strength, she does well but needs a lot of verbal cues to perform, she had trouble at first today with the airex stepping on and off but once we got a rhythm she did well, on the leg press I do need to hold the left knee from going into valgus  OBJECTIVE IMPAIRMENTS: Abnormal gait, decreased balance, decreased coordination, decreased endurance, decreased mobility, difficulty walking, decreased ROM, decreased strength, increased fascial restrictions, increased muscle spasms, impaired flexibility, impaired tone, improper body mechanics, and postural dysfunction.    ACTIVITY LIMITATIONS: standing, squatting, stairs, transfers, and locomotion level   PARTICIPATION LIMITATIONS: shopping, community activity, and occupation   PERSONAL FACTORS: 1 comorbidity: CP  are also affecting patient's functional outcome.    REHAB POTENTIAL: Good   CLINICAL DECISION MAKING: Stable/uncomplicated   EVALUATION COMPLEXITY: Moderate     GOALS: Goals reviewed with patient? Yes   SHORT TERM GOALS: Target date: 05/01/22 I with initial HEP Baseline: Goal status: MET   LONG TERM GOALS: Target date: 10/14/22   I with final HEP Baseline:  Goal status: progressing with her and caregiver 08/19/22   2.  Improve TUG time to < 12 sec Baseline:  Goal status: met 08/14/22  3.  Improve 5 x STS to <12 Baseline:  Goal status:MET 06/05/22   4.  Increase BERG score to at least 44 Baseline:  Goal status: 45/56 MET   5.  Patient will ambulate with normalized gait pattern, minimizing abnormal weight shifts and increasing LLE position to more neutral hip rotation. Baseline:  Goal status: ongoing 08/28/22, progressing 10/02/22     PLAN:   PT FREQUENCY: 1-2x/week   PT DURATION: 10 weeks   PLANNED INTERVENTIONS: Therapeutic exercises, Therapeutic activity,  Neuromuscular re-education, Balance training, Gait training, Patient/Family education, Self Care, Joint mobilization, Dry Needling, Cryotherapy, Moist heat, Ionotophoresis 4mg /ml Dexamethasone, and Manual therapy   PLAN FOR NEXT SESSION: work on hip ER and abduction as well as quad strength add more balance  5:50 PM,10/28/22 Stacie Glaze,  PT

## 2022-10-29 NOTE — Therapy (Signed)
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   Balance problem 05/06/2018   Syncope 05/06/2018   History of cerebrovascular accident 11/18/2017   CP (cerebral palsy), spastic, 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[name]) 06/25/2017   Anxiety state 06/25/2017   Spasticity 06/25/2017   Allergic rhinitis 10/21/2016   Mild intermittent asthma 10/21/2016      PCP: Clayborne Dana, NP   REFERRING PROVIDER: Clayborne Dana, NP   REFERRING DIAG: G80.0 (ICD-10-CM) - CP (cerebral palsy), spastic, 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[name])    THERAPY DIAG:  Spastic quadriplegic cerebral palsy ([name])   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)   Other lack of coordination   Unsteadiness on feet   Difficulty in walking, not elsewhere classified   Left-sided weakness   Muscle weakness (generalized)   Abnormal posture   Cerebral palsy with spastic diplegia ([name])   Rationale for Evaluation and Treatment: Rehabilitation   ONSET DATE: 03/17/22   SUBJECTIVE:    SUBJECTIVE STATEMENT: Had a fall last week, has bruise on the right knee, mom reports that it was a pretty hard fall  PERTINENT HISTORY:     CP (cerebral palsy), spastic, 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[name]) - Primary         She is stable, at baseline (ambulatory, but with with some gait abnormalities, balance issues, and weakness). Rechecking CBC, iron, and vitamin D today.  She has been on supplementation (refilling iron for her). Will make additional changes based on labs if indicated. PT referral placed to [facility] at her request. No acute concerns today.      PAIN:  Are you having pain? No   PRECAUTIONS: None   WEIGHT BEARING RESTRICTIONS: No   FALLS:  Has patient fallen in last 6 months? Yes. 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 Hobbies- Runner, broadcasting/film/video, archery, baseball, tennis, dance    PLOF: Needs assistance with ADLs   PATIENT GOALS: Patient would like to improve her flexibility and balance.   NEXT MD VISIT:    OBJECTIVE:    DIAGNOSTIC FINDINGS:  Radiology consult confirms lumbar scoliosis with apex at L2-3, but since patient is asymptomatic, recommend monitoring it and continue to strengthen.           COGNITION: Overall cognitive status: Within functional limits for tasks assessed                         SENSATION: Not tested     MUSCLE LENGTH: Hamstrings: Right 40 deg; Left 30  deg     POSTURE: left pelvic obliquity, weight shift right, and hips rotated to L with L LE held in IR, increased R WB     LOWER EXTREMITY ROM: Trunk lateraly flexed due to scoliosis, L hip unable to achieve neutral ER, abd limited, B hamstrings tight, L ankle does not achieve neutral DF or inversion. LUE with limited ROM at all joints, in all planes. she wears a splint on her wrist at night.      LOWER EXTREMITY MMT: RLE 4/5, LLE with fluctuating tone throughout, HS very weak, hips also weak-MMT accuracy questionable due to her tone.   FUNCTIONAL TESTS:  5 times sit to stand: 14.77,  07/03/22 13 seconds, 09/30/22 = 12 seconds Timed up and go (TUG): 13.75 , 07/03/22 = 10 seconds, 09/30/22 9 seconds Berg Balance Scale: 48/56  10/21/22 = 49/56    GAIT: Distance walked: clinic distances Assistive device utilized: None Level of assistance: Modified independence Comments: Step-to pattern;Decreased step length - left;Decreased stance time - left;Decreased weight shift to left;Shuffle;Ataxic;Lateral hip instability;Trunk rotated posteriorly on left      TODAY'S TREATMENT:                                                                                                              DATE:  10/30/22 NuStep Standing on airex marching  Heel raises on airex  Leg ext 5# ankle weight 2x10 HS curls blue 2x10 Side stepping over obstacles  Seated ER with green band   10/28/22 Nustep level 5 x 6 minutes Side step on and off airex On bosu balancing 5# marches 2x10 5# hip abduction 2x10 Direction changes Volleyball Supine bridges and then bridges with PT holding the left leg up Crunches Passive stretch to the left hip Leg press 20# 2x10 each leg Leg extension 5# left x 10, then both x10 Cone toe touches Ball kicks  10/23/22 NuStep L5 x61mins  Hip bridges 2x10  Prone hip ext RLE 2x10  Passive stretching oh LE  Lunges on BOSU 20 reps alt in II bars  Standing on BOSU rocking back and forth and side to side  Volleyball hits with steps  5# AW marching 20 reps alt  LAQ 5# 2x10  Side steps with 5# on [facility]    10/21/22 Nustep level 5 x 6 minutes Berg 49/56 TUG 10 seconds 6" alternating toe touches Right foot propped up on the 6" step and trying to balance on the left needing min-mod A Side stepping, fwd and backward walking Leg press 40# both legs, left only 20# and then 30# On airex ball taps back and forth Standing on bosu upside down in pbars with Min A Supine passive left hip stretches Side lying clams and hip abduction with assist Rocker board two ways in pbars Gait outside curbs, slopes grass Bridge and then left leg bridge  10/02/22 Bike level 4 x 5 minutes Gait outside today did not need rest as we walked around the  back parking [employer] on airex Leg press 40# 2x10 ball  b/n knees Then left leg only 2 x10 Sliding board supine hip abduction 2.5# on the ankle Hooklying left leg abduction against yellow tband Supine blue tband left leg extension Bridges Left leg ER and hold trying to keep toes to ceiling Sidelying abduction Supine straight leg abduction Left leg only bridges Passive stretch left LE  09/30/22 Nustep level 5 x 6 minutes Gait outside in the grass, curbs uneven ground SRA/CGA On airex side stepping Stairs step over step Volley ball Direction changes On airex cone toe touches Feet on ball K2C, rotations, bridges, isometric abs Yellow tband left hip abduction Active left hip ER Supine left hip extension green tband Crunches Passive stretch of the left LE  09/25/22 Nustep level 5 x 5 minutes Bike level 4 x 4 mintues Volley ball, volley ball on airex Side step on and off airex On airex cone toe touch On airex head turns On airex eyes closed Passive ROM of the HS, adductors, ER,Hip flexor and quad Supine bridge, bridge with clamshell Yellow tband left hip abduction Active ER of the leg  09/23/22 Bike level 4 x 6 minutes Leg press 50# x10 both legs, left only 20# 2x10 Leg curls 5# 3x10 left only Leg extension 5# 2x10 Volleyball Direction changes walking Standing on airex single arm pulls 5#, also did the left arm with some assist Bridges Yellow tband hip abduction left only Passive left LE stretches Gait in the grass outside  09/19/22 Bike L3 x91mins  NuStep L7 x43mins  Step reach and turn in // bars  Mat table quadruped Tall kneeling and reaching  Bridges 2x10  SLR 2x10  Passive sretching HS, adductors 30s  Tall kneeling hip flexor stretch 30s- needs help getting in position unable to get RLE in front  Tall kneeling sitting on heels as far as she can and back up x10 Ball squeeze with LAQ 2x10   09/16/22 NuStep L5 x22mins  Bike L3 x39mins  Side steps on  airex x10 Standing on airex feet together 30s, then reaching outside BOS  Walking beam in parallel bars Passive stretching of LE  Heel slides and hip abd 2x10 LLE    09/11/22 Bike level 3 x 6 minutes Airex balance beam side stepping, tandem walking Step and reach with turn Side step on and off 2" step Volley ball, volley ball on airex Left LE stretch  09/04/22 Nustep level 5 x 6 minutes Gait outside uneven terrain, grass, slope and curbs On foam mat working on walking, bending and lifting, cone toe touches, figure 8's and then getting up from the floor Passive stretch left hip Active hip abduction in supine, use of sliding board with towel and 2# on the towel PATIENT EDUCATION:  Education details: impact spasticity and inflexibility has on gait and patella tracking during ambulation; encouraged increase in stretch; consider botox or other appropriate intervention with MD regarding spasticity (if needed); updates to HEP Person educated: Patient and Caregiver   Education method: Explanation Education comprehension: verbalized understanding   HOME EXERCISE PROGRAM: [email or web address]  Access Code: [email or web address] URL: [email or web address] Date: 07/17/2022 Prepared by: Stacie Glaze  Exercises - Supine Hip Internal and External Rotation  - 1 x daily - 7 x weekly - 2 sets - 10 reps - 3 hold - Supine Hip Abduction on Slider  - 1 x daily - 7 x weekly - 2 sets - 10 reps - 3 hold - Hooklying Clamshell with Resistance  - 1 x daily - 7 x weekly - 2 sets - 10  reps - 3 hold - Hip Abduction and Adduction Caregiver PROM  - 1 x daily - 7 x weekly - 1 sets - 5 reps - 30-60 hold - Caregiver PROM Hip Internal External Rotation  - 1 x daily - 7 x weekly - 1 sets - 5 reps - 30-60 hold - Supine Short Arc Quad  - 1 x daily - 7 x weekly - 2 sets - 10 reps - 3 hold ASSESSMENT:   CLINICAL IMPRESSION: Patient had a fall, she has  a bruise denies any pain, the fall occurred last week at camp, I did some  focus on trying to get left leg only strength, she does well but needs a lot of verbal cues to perform, she had trouble at first today with the airex stepping on and off but once we got a rhythm she did well, on the leg press I do need to hold the left knee from going into valgus  OBJECTIVE IMPAIRMENTS: Abnormal gait, decreased balance, decreased coordination, decreased endurance, decreased mobility, difficulty walking, decreased ROM, decreased strength, increased fascial restrictions, increased muscle spasms, impaired flexibility, impaired tone, improper body mechanics, and postural dysfunction.    ACTIVITY LIMITATIONS: standing, squatting, stairs, transfers, and locomotion level   PARTICIPATION LIMITATIONS: shopping, community activity, and occupation   PERSONAL FACTORS: 1 comorbidity: CP  are also affecting patient's functional outcome.    REHAB POTENTIAL: Good   CLINICAL DECISION MAKING: Stable/uncomplicated   EVALUATION COMPLEXITY: Moderate     GOALS: Goals reviewed with patient? Yes   SHORT TERM GOALS: Target date: 05/01/22 I with initial HEP Baseline: Goal status: MET   LONG TERM GOALS: Target date: 10/14/22   I with final HEP Baseline:  Goal status: progressing with her and caregiver 08/19/22   2.  Improve TUG time to < 12 sec Baseline:  Goal status: met 08/14/22  3.  Improve 5 x STS to <12 Baseline:  Goal status:MET 06/05/22   4.  Increase BERG score to at least 44 Baseline:  Goal status: 45/56 MET   5.  Patient will ambulate with normalized gait pattern, minimizing abnormal weight shifts and increasing LLE position to more neutral hip rotation. Baseline:  Goal status: ongoing 08/28/22, progressing 10/02/22     PLAN:   PT FREQUENCY: 1-2x/week   PT DURATION: 10 weeks   PLANNED INTERVENTIONS: Therapeutic exercises, Therapeutic activity, Neuromuscular re-education, Balance training, Gait training, Patient/Family education, Self Care, Joint mobilization, Dry  Needling, Cryotherapy, Moist heat, Ionotophoresis 4mg /ml Dexamethasone, and Manual therapy   PLAN FOR NEXT SESSION: work on hip ER and abduction as well as quad strength add more balance  5:10 PM,10/29/22 Stacie Glaze,  PT

## 2022-10-30 ENCOUNTER — Ambulatory Visit: Payer: BC Managed Care – PPO

## 2022-10-30 DIAGNOSIS — G801 Spastic diplegic cerebral palsy: Secondary | ICD-10-CM

## 2022-10-30 DIAGNOSIS — R2689 Other abnormalities of gait and mobility: Secondary | ICD-10-CM

## 2022-10-30 DIAGNOSIS — M6281 Muscle weakness (generalized): Secondary | ICD-10-CM | POA: Diagnosis not present

## 2022-10-30 DIAGNOSIS — R2681 Unsteadiness on feet: Secondary | ICD-10-CM

## 2022-10-30 DIAGNOSIS — G800 Spastic quadriplegic cerebral palsy: Secondary | ICD-10-CM

## 2022-10-30 DIAGNOSIS — R262 Difficulty in walking, not elsewhere classified: Secondary | ICD-10-CM

## 2022-11-03 NOTE — Therapy (Signed)
OUTPATIENT PHYSICAL THERAPY TREATMENT  Patient Name: Kerri Robertson MRN: [id number] DOB:2001-01-01, 22 y.o., female Today's Date: 04/10/2022   END OF SESSION:  PT End of Session - 11/04/22 1714     Visit Number 39    Date for PT Re-Evaluation 12/21/22    PT Start Time 1715    PT Stop Time 1800    PT Time Calculation (min) 45 min    Activity Tolerance Patient tolerated treatment well    Behavior During Therapy WFL for tasks assessed/performed                    Past Medical History:  Diagnosis Date   Allergy     Cerebral palsy ([name])     Stroke ([name])           Past Surgical History:  Procedure Laterality Date   ARM SURGERY        2013   FOOT SURGERY        2010   HAMSTRING LENGTHENING       TIBIA / FIBIA LENGTHENING            Patient Active Problem List    Diagnosis Date Noted   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   Balance problem 05/06/2018   Syncope 05/06/2018   History of cerebrovascular accident 11/18/2017   CP (cerebral palsy), spastic, 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[name]) 06/25/2017   Anxiety state 06/25/2017   Spasticity 06/25/2017   Allergic rhinitis 10/21/2016   Mild intermittent asthma 10/21/2016      PCP: Clayborne Dana, NP   REFERRING PROVIDER: Clayborne Dana, NP   REFERRING DIAG: G80.0 (ICD-10-CM) - CP (cerebral palsy), spastic, 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[name])    THERAPY DIAG:  Spastic quadriplegic cerebral palsy ([name])   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 One at home while turning around to her left side, once in the store, and then at camp. Mom states it seems like her legs just crumble under her. She hurt her left hand, and has some bruises on her legs.   PERTINENT HISTORY:     CP (cerebral palsy), spastic, 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[name]) - Primary         She is stable, at baseline (ambulatory, but with with some gait abnormalities, balance issues, and weakness). Rechecking CBC, iron, and vitamin D today. She has been on supplementation (refilling iron for her). Will make additional changes based on labs if indicated. PT referral placed to [facility] at her request. No acute concerns today.      PAIN:  Are you having pain? No   PRECAUTIONS: None   WEIGHT BEARING RESTRICTIONS: No   FALLS:  Has patient fallen in last 6 months? Yes. 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 Hobbies- Runner, broadcasting/film/video, archery, baseball, tennis, dance    PLOF: Needs assistance with ADLs  PATIENT GOALS: Patient would like to improve her flexibility and balance.   NEXT MD VISIT:    OBJECTIVE:    DIAGNOSTIC FINDINGS:  Radiology consult confirms lumbar scoliosis with apex at L2-3, but since patient is asymptomatic, recommend monitoring it and continue to strengthen.           COGNITION: Overall cognitive status: Within functional limits for tasks assessed                         SENSATION: Not tested     MUSCLE LENGTH: Hamstrings: Right 40 deg; Left 30 deg     POSTURE: left pelvic obliquity, weight shift right, and hips rotated to L with L LE held in IR, increased R WB     LOWER EXTREMITY ROM: Trunk lateraly flexed due to scoliosis, L hip unable to achieve neutral ER, abd limited, B hamstrings tight, L ankle does not achieve neutral DF or inversion. LUE with  limited ROM at all joints, in all planes. she wears a splint on her wrist at night.      LOWER EXTREMITY MMT: RLE 4/5, LLE with fluctuating tone throughout, HS very weak, hips also weak-MMT accuracy questionable due to her tone.   FUNCTIONAL TESTS:  5 times sit to stand: 14.77,  07/03/22 13 seconds, 09/30/22 = 12 seconds Timed up and go (TUG): 13.75 , 07/03/22 = 10 seconds, 09/30/22 9 seconds Berg Balance Scale: 48/56  10/21/22 = 49/56   GAIT: Distance walked: clinic distances Assistive device utilized: None Level of assistance: Modified independence Comments: Step-to pattern;Decreased step length - left;Decreased stance time - left;Decreased weight shift to left;Shuffle;Ataxic;Lateral hip instability;Trunk rotated posteriorly on left      TODAY'S TREATMENT:                                                                                                              DATE:  11/04/22 NuStep L5 x56mins  Lunges on steps 2x10 Leg press 20# 2x10, LLE only x5 STS on airex, CGA falls back into heels a little bit  Leg ext 5# BLE 2x10, 5# LLE x5 Ball kicks x10 each side  Side steps on beam    10/30/22 NuStep L5 x47mins  Leg ext 5# ankle weight 2x10 HS curls green 2x10 Seated ER with red band x10 each side, very minimal activation Standing on airex marching  Heel raises on airex  Side stepping over obstacles, minA  On airex reaching different directions, CGA  On airex volleyball hits   10/28/22 Nustep level 5 x 6 minutes Side step on and off airex On bosu balancing 5# marches 2x10 5# hip abduction 2x10 Direction changes Volleyball Supine bridges and then bridges with PT holding the left leg up Crunches Passive stretch to the left hip Leg press 20# 2x10 each leg Leg extension 5# left x 10, then both x10 Cone toe touches Ball kicks  10/23/22 NuStep L5 x20mins  Hip bridges 2x10  Prone hip ext RLE 2x10  Passive stretching oh LE  Lunges  on BOSU 20 reps alt in II bars  Standing on  BOSU rocking back and forth and side to side  Volleyball hits with steps  5# AW marching 20 reps alt  LAQ 5# 2x10  Side steps with 5# on [facility]    10/21/22 Nustep level 5 x 6 minutes Berg 49/56 TUG 10 seconds 6" alternating toe touches Right foot propped up on the 6" step and trying to balance on the left needing min-mod A Side stepping, fwd and backward walking Leg press 40# both legs, left only 20# and then 30# On airex ball taps back and forth Standing on bosu upside down in pbars with Min A Supine passive left hip stretches Side lying clams and hip abduction with assist Rocker board two ways in pbars Gait outside curbs, slopes grass Bridge and then left leg bridge  10/02/22 Bike level 4 x 5 minutes Gait outside today did not need rest as we walked around the back parking [employer] on airex Leg press 40# 2x10 ball b/n knees Then left leg only 2 x10 Sliding board supine hip abduction 2.5# on the ankle Hooklying left leg abduction against yellow tband Supine blue tband left leg extension Bridges Left leg ER and hold trying to keep toes to ceiling Sidelying abduction Supine straight leg abduction Left leg only bridges Passive stretch left LE  09/30/22 Nustep level 5 x 6 minutes Gait outside in the grass, curbs uneven ground SRA/CGA On airex side stepping Stairs step over step Volley ball Direction changes On airex cone toe touches Feet on ball K2C, rotations, bridges, isometric abs Yellow tband left hip abduction Active left hip ER Supine left hip extension green tband Crunches Passive stretch of the left LE  09/25/22 Nustep level 5 x 5 minutes Bike level 4 x 4 mintues Volley ball, volley ball on airex Side step on and off airex On airex cone toe touch On airex head turns On airex eyes closed Passive ROM of the HS, adductors, ER,Hip flexor and quad Supine bridge, bridge with clamshell Yellow tband left hip abduction Active ER of the  leg  09/23/22 Bike level 4 x 6 minutes Leg press 50# x10 both legs, left only 20# 2x10 Leg curls 5# 3x10 left only Leg extension 5# 2x10 Volleyball Direction changes walking Standing on airex single arm pulls 5#, also did the left arm with some assist Bridges Yellow tband hip abduction left only Passive left LE stretches Gait in the grass outside  09/19/22 Bike L3 x3mins  NuStep L7 x30mins  Step reach and turn in // bars  Mat table quadruped Tall kneeling and reaching  Bridges 2x10  SLR 2x10  Passive sretching HS, adductors 30s  Tall kneeling hip flexor stretch 30s- needs help getting in position unable to get RLE in front  Tall kneeling sitting on heels as far as she can and back up x10 Ball squeeze with LAQ 2x10   09/16/22 NuStep L5 x35mins  Bike L3 x15mins  Side steps on airex x10 Standing on airex feet together 30s, then reaching outside BOS  Walking beam in parallel bars Passive stretching of LE  Heel slides and hip abd 2x10 LLE    09/11/22 Bike level 3 x 6 minutes Airex balance beam side stepping, tandem walking Step and reach with turn Side step on and off 2" step Volley ball, volley ball on airex Left LE stretch  09/04/22 Nustep level 5 x 6 minutes Gait outside uneven terrain, grass, slope and curbs On foam mat  working on walking, bending and lifting, cone toe touches, figure 8's and then getting up from the floor Passive stretch left hip Active hip abduction in supine, use of sliding board with towel and 2# on the towel PATIENT EDUCATION:  Education details: impact spasticity and inflexibility has on gait and patella tracking during ambulation; encouraged increase in stretch; consider botox or other appropriate intervention with MD regarding spasticity (if needed); updates to HEP Person educated: Patient and Caregiver   Education method: Explanation Education comprehension: verbalized understanding   HOME EXERCISE PROGRAM: [email or web address]  Access Code:  [email or web address] URL: [email or web address] Date: 07/17/2022 Prepared by: Stacie Glaze  Exercises - Supine Hip Internal and External Rotation  - 1 x daily - 7 x weekly - 2 sets - 10 reps - 3 hold - Supine Hip Abduction on Slider  - 1 x daily - 7 x weekly - 2 sets - 10 reps - 3 hold - Hooklying Clamshell with Resistance  - 1 x daily - 7 x weekly - 2 sets - 10 reps - 3 hold - Hip Abduction and Adduction Caregiver PROM  - 1 x daily - 7 x weekly - 1 sets - 5 reps - 30-60 hold - Caregiver PROM Hip Internal External Rotation  - 1 x daily - 7 x weekly - 1 sets - 5 reps - 30-60 hold - Supine Short Arc Quad  - 1 x daily - 7 x weekly - 2 sets - 10 reps - 3 hold ASSESSMENT:   CLINICAL IMPRESSION: Patient had a number of falls, mom reports some bruising and pain in her L hand. Patient reports she thinks she is falling from being up on her feet all day and being tired. She tends to lose her balance backwards for majority of the falls but states depending on the day sometimes she goes forwards or to the side. Today she is more fatigued than usual, reports it has been a long day. Continued with strengthening and balance as tolerated. Added in a LTG for falls.   OBJECTIVE IMPAIRMENTS: Abnormal gait, decreased balance, decreased coordination, decreased endurance, decreased mobility, difficulty walking, decreased ROM, decreased strength, increased fascial restrictions, increased muscle spasms, impaired flexibility, impaired tone, improper body mechanics, and postural dysfunction.    ACTIVITY LIMITATIONS: standing, squatting, stairs, transfers, and locomotion level   PARTICIPATION LIMITATIONS: shopping, community activity, and occupation   PERSONAL FACTORS: 1 comorbidity: CP  are also affecting patient's functional outcome.    REHAB POTENTIAL: Good   CLINICAL DECISION MAKING: Stable/uncomplicated   EVALUATION COMPLEXITY: Moderate     GOALS: Goals reviewed with patient? Yes   SHORT TERM  GOALS: Target date: 05/01/22 I with initial HEP Baseline: Goal status: MET   LONG TERM GOALS: Target date: 10/14/22   I with final HEP Baseline:  Goal status: progressing with her and caregiver 08/19/22   2.  Improve TUG time to < 12 sec Baseline:  Goal status: met 08/14/22  3.  Improve 5 x STS to <12 Baseline:  Goal status:MET 06/05/22   4.  Increase BERG score to at least 44 Baseline:  Goal status: 45/56 MET   5.  Patient will ambulate with normalized gait pattern, minimizing abnormal weight shifts and increasing LLE position to more neutral hip rotation. Baseline:  Goal status: ongoing 08/28/22, progressing 10/02/22  6. Patient will report no falls in a 4 week period             Baseline: 5 falls in 8 days 11/04/22  Goal status: Initial        PLAN:   PT FREQUENCY: 1-2x/week   PT DURATION: 10 weeks   PLANNED INTERVENTIONS: Therapeutic exercises, Therapeutic activity, Neuromuscular re-education, Balance training, Gait training, Patient/Family education, Self Care, Joint mobilization, Dry Needling, Cryotherapy, Moist heat, Ionotophoresis 4mg /ml Dexamethasone, and Manual therapy   PLAN FOR NEXT SESSION: work on hip ER and abduction as well as quad strength add more balance  6:05 PM,11/04/22 Cassie Freer,  PT, DPT

## 2022-11-04 ENCOUNTER — Ambulatory Visit: Payer: BC Managed Care – PPO | Attending: Neurology

## 2022-11-04 DIAGNOSIS — R262 Difficulty in walking, not elsewhere classified: Secondary | ICD-10-CM | POA: Diagnosis present

## 2022-11-04 DIAGNOSIS — G801 Spastic diplegic cerebral palsy: Secondary | ICD-10-CM | POA: Diagnosis present

## 2022-11-04 DIAGNOSIS — G800 Spastic quadriplegic cerebral palsy: Secondary | ICD-10-CM | POA: Diagnosis present

## 2022-11-04 DIAGNOSIS — R2689 Other abnormalities of gait and mobility: Secondary | ICD-10-CM | POA: Diagnosis present

## 2022-11-04 DIAGNOSIS — M6281 Muscle weakness (generalized): Secondary | ICD-10-CM | POA: Diagnosis present

## 2022-11-04 DIAGNOSIS — R278 Other lack of coordination: Secondary | ICD-10-CM | POA: Insufficient documentation

## 2022-11-04 DIAGNOSIS — R2681 Unsteadiness on feet: Secondary | ICD-10-CM | POA: Insufficient documentation

## 2022-11-04 DIAGNOSIS — R531 Weakness: Secondary | ICD-10-CM | POA: Diagnosis present

## 2022-11-04 DIAGNOSIS — I69954 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: Secondary | ICD-10-CM | POA: Insufficient documentation

## 2022-11-04 DIAGNOSIS — R293 Abnormal posture: Secondary | ICD-10-CM | POA: Insufficient documentation

## 2022-11-13 ENCOUNTER — Ambulatory Visit: Payer: BC Managed Care – PPO

## 2022-11-13 DIAGNOSIS — I69954 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: Secondary | ICD-10-CM

## 2022-11-13 DIAGNOSIS — R531 Weakness: Secondary | ICD-10-CM

## 2022-11-13 DIAGNOSIS — R293 Abnormal posture: Secondary | ICD-10-CM

## 2022-11-13 DIAGNOSIS — M6281 Muscle weakness (generalized): Secondary | ICD-10-CM | POA: Diagnosis not present

## 2022-11-13 DIAGNOSIS — G801 Spastic diplegic cerebral palsy: Secondary | ICD-10-CM

## 2022-11-13 DIAGNOSIS — R278 Other lack of coordination: Secondary | ICD-10-CM

## 2022-11-13 DIAGNOSIS — R2681 Unsteadiness on feet: Secondary | ICD-10-CM

## 2022-11-13 DIAGNOSIS — R2689 Other abnormalities of gait and mobility: Secondary | ICD-10-CM

## 2022-11-13 DIAGNOSIS — G800 Spastic quadriplegic cerebral palsy: Secondary | ICD-10-CM

## 2022-11-13 DIAGNOSIS — R262 Difficulty in walking, not elsewhere classified: Secondary | ICD-10-CM

## 2022-11-13 NOTE — Therapy (Signed)
OUTPATIENT PHYSICAL THERAPY TREATMENT Progress Note Reporting Period 09/25/22 to 11/13/22  See note below for Objective Data and Assessment of Progress/Goals.     Patient Name: Kerri Robertson MRN: [id number] DOB:December 17, 2000, 22 y.o., female Today's Date: 04/10/2022   END OF SESSION:  PT End of Session - 11/13/22 1723     Visit Number 40    Date for PT Re-Evaluation 12/21/22    PT Start Time 1723    PT Stop Time 1800    PT Time Calculation (min) 37 min    Activity Tolerance Patient tolerated treatment well    Behavior During Therapy WFL for tasks assessed/performed                     Past Medical History:  Diagnosis Date   Allergy     Cerebral palsy ([name])     Stroke [facility])           Past Surgical History:  Procedure Laterality Date   ARM SURGERY        2013   FOOT SURGERY        2010   HAMSTRING LENGTHENING       TIBIA / FIBIA LENGTHENING            Patient Active Problem List    Diagnosis Date Noted   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   Balance problem 05/06/2018   Syncope 05/06/2018   History of cerebrovascular accident 11/18/2017   CP (cerebral palsy), spastic, 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[name]) 06/25/2017   Anxiety state 06/25/2017   Spasticity 06/25/2017   Allergic rhinitis 10/21/2016   Mild intermittent asthma 10/21/2016      PCP: Clayborne Dana, NP   REFERRING PROVIDER: Clayborne Dana, NP   REFERRING DIAG: G80.0 (ICD-10-CM) - CP (cerebral palsy), spastic, 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[name])    THERAPY DIAG:  Spastic quadriplegic cerebral palsy ([name])   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   PERTINENT HISTORY:     CP (cerebral palsy), spastic, 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[name]) - Primary         She is stable, at baseline (ambulatory, but with with some gait abnormalities, balance issues, and weakness). Rechecking CBC, iron, and vitamin D today. She has been on supplementation (refilling iron for her). Will make additional changes based on labs if indicated. PT referral placed to [facility] at her request. No acute concerns today.      PAIN:  Are you having pain? No   PRECAUTIONS: None   WEIGHT BEARING RESTRICTIONS: No   FALLS:  Has patient fallen in last 6 months? Yes. 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 Hobbies- Runner, broadcasting/film/video, archery, baseball, tennis, dance    PLOF: Needs assistance with ADLs   PATIENT GOALS: Patient would like to improve her flexibility and balance.   NEXT MD VISIT:  OBJECTIVE:    DIAGNOSTIC FINDINGS:  Radiology consult confirms lumbar scoliosis with apex at L2-3, but since patient is asymptomatic, recommend monitoring it and continue to strengthen.           COGNITION: Overall cognitive status: Within functional limits for tasks assessed                         SENSATION: Not tested     MUSCLE LENGTH: Hamstrings: Right 40 deg; Left 30 deg     POSTURE: left pelvic obliquity, weight shift right, and hips rotated to L with L LE held in IR, increased R WB     LOWER EXTREMITY ROM: Trunk lateraly flexed due to scoliosis, L hip unable to achieve neutral ER, abd limited, B hamstrings tight, L ankle does not achieve neutral DF or inversion. LUE with limited ROM at all joints, in all planes. she wears a splint on her  wrist at night.      LOWER EXTREMITY MMT: RLE 4/5, LLE with fluctuating tone throughout, HS very weak, hips also weak-MMT accuracy questionable due to her tone.   FUNCTIONAL TESTS:  5 times sit to stand: 14.77,  07/03/22 13 seconds, 09/30/22 = 12 seconds Timed up and go (TUG): 13.75 , 07/03/22 = 10 seconds, 09/30/22 9 seconds Berg Balance Scale: 48/56  10/21/22 = 49/56   GAIT: Distance walked: clinic distances Assistive device utilized: None Level of assistance: Modified independence Comments: Step-to pattern;Decreased step length - left;Decreased stance time - left;Decreased weight shift to left;Shuffle;Ataxic;Lateral hip instability;Trunk rotated posteriorly on left      TODAY'S TREATMENT:                                                                                                              DATE:  11/13/22 NuStep L5 x68mins  Supine bridges and then bridges with PT holding the left leg up 2x10  Passive stretch to the left hip Feet on ball K2C, rotations, bridges x10  On airex cone toe touches in II bars  Step ups 2x10 leading with each side  STS x10, then x10 with ball hit   11/04/22 NuStep L5 x79mins  Lunges on steps 2x10 Leg press 20# 2x10, LLE only x5 STS on airex, CGA falls back into heels a little bit  Leg ext 5# BLE 2x10, 5# LLE x5 Ball kicks x10 each side  Side steps on beam    10/30/22 NuStep L5 x70mins  Leg ext 5# ankle weight 2x10 HS curls green 2x10 Seated ER with red band x10 each side, very minimal activation Standing on airex marching  Heel raises on airex  Side stepping over obstacles, minA  On airex reaching different directions, CGA  On airex volleyball hits   10/28/22 Nustep level 5 x 6 minutes Side step on and off airex On bosu balancing 5# marches 2x10 5# hip abduction 2x10 Direction changes Volleyball Supine bridges and then bridges with PT holding the left leg up Crunches Passive stretch to the left hip Leg press  20# 2x10 each leg Leg  extension 5# left x 10, then both x10 Cone toe touches Ball kicks  10/23/22 NuStep L5 x67mins  Hip bridges 2x10  Prone hip ext RLE 2x10  Passive stretching oh LE  Lunges on BOSU 20 reps alt in II bars  Standing on BOSU rocking back and forth and side to side  Volleyball hits with steps  5# AW marching 20 reps alt  LAQ 5# 2x10  Side steps with 5# on [facility]    10/21/22 Nustep level 5 x 6 minutes Berg 49/56 TUG 10 seconds 6" alternating toe touches Right foot propped up on the 6" step and trying to balance on the left needing min-mod A Side stepping, fwd and backward walking Leg press 40# both legs, left only 20# and then 30# On airex ball taps back and forth Standing on bosu upside down in pbars with Min A Supine passive left hip stretches Side lying clams and hip abduction with assist Rocker board two ways in pbars Gait outside curbs, slopes grass Bridge and then left leg bridge  10/02/22 Bike level 4 x 5 minutes Gait outside today did not need rest as we walked around the back parking [employer] on airex Leg press 40# 2x10 ball b/n knees Then left leg only 2 x10 Sliding board supine hip abduction 2.5# on the ankle Hooklying left leg abduction against yellow tband Supine blue tband left leg extension Bridges Left leg ER and hold trying to keep toes to ceiling Sidelying abduction Supine straight leg abduction Left leg only bridges Passive stretch left LE  09/30/22 Nustep level 5 x 6 minutes Gait outside in the grass, curbs uneven ground SRA/CGA On airex side stepping Stairs step over step Volley ball Direction changes On airex cone toe touches Feet on ball K2C, rotations, bridges, isometric abs Yellow tband left hip abduction Active left hip ER Supine left hip extension green tband Crunches Passive stretch of the left LE  09/25/22 Nustep level 5 x 5 minutes Bike level 4 x 4 mintues Volley ball, volley ball on airex Side step on and off airex On airex  cone toe touch On airex head turns On airex eyes closed Passive ROM of the HS, adductors, ER,Hip flexor and quad Supine bridge, bridge with clamshell Yellow tband left hip abduction Active ER of the leg  09/23/22 Bike level 4 x 6 minutes Leg press 50# x10 both legs, left only 20# 2x10 Leg curls 5# 3x10 left only Leg extension 5# 2x10 Volleyball Direction changes walking Standing on airex single arm pulls 5#, also did the left arm with some assist Bridges Yellow tband hip abduction left only Passive left LE stretches Gait in the grass outside  09/19/22 Bike L3 x50mins  NuStep L7 x76mins  Step reach and turn in // bars  Mat table quadruped Tall kneeling and reaching  Bridges 2x10  SLR 2x10  Passive sretching HS, adductors 30s  Tall kneeling hip flexor stretch 30s- needs help getting in position unable to get RLE in front  Tall kneeling sitting on heels as far as she can and back up x10 Ball squeeze with LAQ 2x10   09/16/22 NuStep L5 x70mins  Bike L3 x5mins  Side steps on airex x10 Standing on airex feet together 30s, then reaching outside BOS  Walking beam in parallel bars Passive stretching of LE  Heel slides and hip abd 2x10 LLE    09/11/22 Bike level 3 x 6 minutes Airex balance beam side stepping, tandem walking  Step and reach with turn Side step on and off 2" step Volley ball, volley ball on airex Left LE stretch  09/04/22 Nustep level 5 x 6 minutes Gait outside uneven terrain, grass, slope and curbs On foam mat working on walking, bending and lifting, cone toe touches, figure 8's and then getting up from the floor Passive stretch left hip Active hip abduction in supine, use of sliding board with towel and 2# on the towel PATIENT EDUCATION:  Education details: impact spasticity and inflexibility has on gait and patella tracking during ambulation; encouraged increase in stretch; consider botox or other appropriate intervention with MD regarding spasticity (if  needed); updates to HEP Person educated: Patient and Caregiver   Education method: Explanation Education comprehension: verbalized understanding   HOME EXERCISE PROGRAM: [email or web address]  Access Code: [email or web address] URL: [email or web address] Date: 07/17/2022 Prepared by: Stacie Glaze  Exercises - Supine Hip Internal and External Rotation  - 1 x daily - 7 x weekly - 2 sets - 10 reps - 3 hold - Supine Hip Abduction on Slider  - 1 x daily - 7 x weekly - 2 sets - 10 reps - 3 hold - Hooklying Clamshell with Resistance  - 1 x daily - 7 x weekly - 2 sets - 10 reps - 3 hold - Hip Abduction and Adduction Caregiver PROM  - 1 x daily - 7 x weekly - 1 sets - 5 reps - 30-60 hold - Caregiver PROM Hip Internal External Rotation  - 1 x daily - 7 x weekly - 1 sets - 5 reps - 30-60 hold - Supine Short Arc Quad  - 1 x daily - 7 x weekly - 2 sets - 10 reps - 3 hold ASSESSMENT:   CLINICAL IMPRESSION: Patient returns with no new reports and no more falls. We worked on some hip strengthening with variation of glute bridges. Tried to do some standing on airex cone taps, but she is unable to do without holding on for support. Continue to work on balance and LE strengthening.   OBJECTIVE IMPAIRMENTS: Abnormal gait, decreased balance, decreased coordination, decreased endurance, decreased mobility, difficulty walking, decreased ROM, decreased strength, increased fascial restrictions, increased muscle spasms, impaired flexibility, impaired tone, improper body mechanics, and postural dysfunction.    ACTIVITY LIMITATIONS: standing, squatting, stairs, transfers, and locomotion level   PARTICIPATION LIMITATIONS: shopping, community activity, and occupation   PERSONAL FACTORS: 1 comorbidity: CP  are also affecting patient's functional outcome.    REHAB POTENTIAL: Good   CLINICAL DECISION MAKING: Stable/uncomplicated   EVALUATION COMPLEXITY: Moderate     GOALS: Goals reviewed with patient? Yes   SHORT  TERM GOALS: Target date: 05/01/22 I with initial HEP Baseline: Goal status: MET   LONG TERM GOALS: Target date: 12/21/22   I with final HEP Baseline:  Goal status: progressing with her and caregiver 08/19/22   2.  Improve TUG time to < 12 sec Baseline:  Goal status: met 08/14/22  3.  Improve 5 x STS to <12 Baseline:  Goal status:MET 06/05/22   4.  Increase BERG score to at least 44 Baseline:  Goal status: 45/56 MET   5.  Patient will ambulate with normalized gait pattern, minimizing abnormal weight shifts and increasing LLE position to more neutral hip rotation. Baseline:  Goal status: ongoing 08/28/22, progressing 10/02/22, ongoing 11/13/22  6. Patient will report no falls in a 4 week period             Baseline: 5 falls in 8 days 11/04/22  Goal status: ongoing 11/13/22        PLAN:   PT FREQUENCY: 1-2x/week   PT DURATION: 10 weeks   PLANNED INTERVENTIONS: Therapeutic exercises, Therapeutic activity, Neuromuscular re-education, Balance training, Gait training, Patient/Family education, Self Care, Joint mobilization, Dry Needling, Cryotherapy, Moist heat, Ionotophoresis 4mg /ml Dexamethasone, and Manual therapy   PLAN FOR NEXT SESSION: work on hip ER and abduction as well as quad strength add more balance  6:01 PM,11/13/22 Cassie Freer,  PT, DPT

## 2022-11-25 ENCOUNTER — Ambulatory Visit: Payer: BC Managed Care – PPO | Admitting: Physical Therapy

## 2022-11-25 ENCOUNTER — Encounter: Payer: Self-pay | Admitting: Physical Therapy

## 2022-11-25 DIAGNOSIS — M6281 Muscle weakness (generalized): Secondary | ICD-10-CM | POA: Diagnosis not present

## 2022-11-25 DIAGNOSIS — G800 Spastic quadriplegic cerebral palsy: Secondary | ICD-10-CM

## 2022-11-25 DIAGNOSIS — G801 Spastic diplegic cerebral palsy: Secondary | ICD-10-CM

## 2022-11-25 DIAGNOSIS — R2681 Unsteadiness on feet: Secondary | ICD-10-CM

## 2022-11-25 DIAGNOSIS — R531 Weakness: Secondary | ICD-10-CM

## 2022-11-25 DIAGNOSIS — R262 Difficulty in walking, not elsewhere classified: Secondary | ICD-10-CM

## 2022-11-25 NOTE — Therapy (Signed)
OUTPATIENT PHYSICAL THERAPY TREATMENT   Patient Name: Kerri Robertson MRN: [id number] DOB:2000-07-07, 22 y.o., female Today's Date: 04/10/2022   END OF SESSION:  PT End of Session - 11/25/22 1652     Visit Number 41    Date for PT Re-Evaluation 12/21/22    PT Start Time 1650    PT Stop Time 1735    PT Time Calculation (min) 45 min    Activity Tolerance Patient tolerated treatment well    Behavior During Therapy [facility] for tasks assessed/performed                     Past Medical History:  Diagnosis Date   Allergy     Cerebral palsy ([name])     Stroke [facility])           Past Surgical History:  Procedure Laterality Date   ARM SURGERY        2013   FOOT SURGERY        2010   HAMSTRING LENGTHENING       TIBIA / FIBIA LENGTHENING            Patient Active Problem List    Diagnosis Date Noted   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   Balance problem 05/06/2018   Syncope 05/06/2018   History of cerebrovascular accident 11/18/2017   CP (cerebral palsy), spastic, 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[name]) 06/25/2017   Anxiety state 06/25/2017   Spasticity 06/25/2017   Allergic rhinitis 10/21/2016   Mild intermittent asthma 10/21/2016      PCP: Clayborne Dana, NP   REFERRING PROVIDER: Clayborne Dana, NP   REFERRING DIAG: G80.0 (ICD-10-CM) - CP (cerebral palsy), spastic, 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[name])    THERAPY DIAG:  Spastic quadriplegic cerebral palsy ([name])   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)   Other lack of coordination   Unsteadiness on feet   Difficulty in walking, not elsewhere classified   Left-sided weakness   Muscle weakness (generalized)   Abnormal posture   Cerebral palsy with spastic diplegia ([name])   Rationale for Evaluation and Treatment: Rehabilitation   ONSET DATE: 03/17/22   SUBJECTIVE:    SUBJECTIVE STATEMENT: Patient [place], she is unsure of what happened reports that she turned and feels that she hit another foot of someone next to her and fell, reports that she was sore but doing well  PERTINENT HISTORY:     CP (cerebral palsy), spastic, 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[name]) - Primary         She is stable, at baseline (ambulatory, but with with some gait abnormalities, balance issues, and weakness). Rechecking CBC, iron, and vitamin D today. She has been on supplementation (refilling iron for her). Will make additional changes based on labs if indicated. PT referral placed to [facility] at her request. No acute concerns today.      PAIN:  Are you having pain? No   PRECAUTIONS: None   WEIGHT BEARING RESTRICTIONS: No   FALLS:  Has patient fallen in last 6 months? Yes. 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 Hobbies- Runner, broadcasting/film/video, archery, baseball, tennis, dance    PLOF: Needs assistance with ADLs   PATIENT GOALS: Patient would like to improve  her flexibility and balance.   NEXT MD VISIT:    OBJECTIVE:    DIAGNOSTIC FINDINGS:  Radiology consult confirms lumbar scoliosis with apex at L2-3, but since patient is asymptomatic, recommend monitoring it and continue to strengthen.           COGNITION: Overall cognitive status: Within functional limits for tasks assessed                         SENSATION: Not tested     MUSCLE LENGTH: Hamstrings: Right 40 deg; Left 30 deg     POSTURE: left pelvic obliquity, weight shift right, and hips rotated to L with L LE held in IR, increased R WB     LOWER EXTREMITY ROM: Trunk lateraly flexed due to scoliosis, L hip unable to achieve neutral ER, abd limited, B hamstrings tight, L ankle does not achieve neutral DF or inversion. LUE with limited ROM at all joints,  in all planes. she wears a splint on her wrist at night.      LOWER EXTREMITY MMT: RLE 4/5, LLE with fluctuating tone throughout, HS very weak, hips also weak-MMT accuracy questionable due to her tone.   FUNCTIONAL TESTS:  5 times sit to stand: 14.77,  07/03/22 13 seconds, 09/30/22 = 12 seconds Timed up and go (TUG): 13.75 , 07/03/22 = 10 seconds, 09/30/22 9 seconds Berg Balance Scale: 48/56  10/21/22 = 49/56   GAIT: Distance walked: clinic distances Assistive device utilized: None Level of assistance: Modified independence Comments: Step-to pattern;Decreased step length - left;Decreased stance time - left;Decreased weight shift to left;Shuffle;Ataxic;Lateral hip instability;Trunk rotated posteriorly on left      TODAY'S TREATMENT:                                                                                                              DATE:  11/25/22 Nustep level 5 x 6 minutes Standing balance volleyball and then on the airex Leg press 20# x10, 40# x10, 60# x10, then 40# at a lower spot for increase ROM, then left leg 20# 2x10 Left leg HS curls 10# 2x10 Left leg only extension 5# 2x10 5# on airex row and extension with SBA Side step on and off airex Stairs step over step up and down [employer] tband hip extension Green tband clamshells Sidelying clamshells Passive left LE stretches crunches  11/13/22 NuStep L5 x65mins  Supine bridges and then bridges with PT holding the left leg up 2x10  Passive stretch to the left hip Feet on ball K2C, rotations, bridges x10  On airex cone toe touches in II bars  Step ups 2x10 leading with each side  STS x10, then x10 with ball hit   11/04/22 NuStep L5 x28mins  Lunges on steps 2x10 Leg press 20# 2x10, LLE only x5 STS on airex, CGA falls back into heels a little bit  Leg ext 5# BLE 2x10, 5# LLE x5 Ball kicks x10 each side  Side steps on beam    10/30/22 NuStep L5 x77mins  Leg ext 5# ankle weight 2x10 HS curls green 2x10 Seated ER  with red band x10 each side, very minimal activation Standing on airex marching  Heel raises on airex  Side stepping over obstacles, minA  On airex reaching different directions, CGA  On airex volleyball hits   10/28/22 Nustep level 5 x 6 minutes Side step on and off airex On bosu balancing 5# marches 2x10 5# hip abduction 2x10 Direction changes Volleyball Supine bridges and then bridges with PT holding the left leg up Crunches Passive stretch to the left hip Leg press 20# 2x10 each leg Leg extension 5# left x 10, then both x10 Cone toe touches Ball kicks  10/23/22 NuStep L5 x17mins  Hip bridges 2x10  Prone hip ext RLE 2x10  Passive stretching oh LE  Lunges on BOSU 20 reps alt in II bars  Standing on BOSU rocking back and forth and side to side  Volleyball hits with steps  5# AW marching 20 reps alt  LAQ 5# 2x10  Side steps with 5# on [facility]    10/21/22 Nustep level 5 x 6 minutes Berg 49/56 TUG 10 seconds 6" alternating toe touches Right foot propped up on the 6" step and trying to balance on the left needing min-mod A Side stepping, fwd and backward walking Leg press 40# both legs, left only 20# and then 30# On airex ball taps back and forth Standing on bosu upside down in pbars with Min A Supine passive left hip stretches Side lying clams and hip abduction with assist Rocker board two ways in pbars Gait outside curbs, slopes grass Bridge and then left leg bridge  10/02/22 Bike level 4 x 5 minutes Gait outside today did not need rest as we walked around the back parking [employer] on airex Leg press 40# 2x10 ball b/n knees Then left leg only 2 x10 Sliding board supine hip abduction 2.5# on the ankle Hooklying left leg abduction against yellow tband Supine blue tband left leg extension Bridges Left leg ER and hold trying to keep toes to ceiling Sidelying abduction Supine straight leg abduction Left leg only bridges Passive stretch left  LE  09/30/22 Nustep level 5 x 6 minutes Gait outside in the grass, curbs uneven ground SRA/CGA On airex side stepping Stairs step over step Volley ball Direction changes On airex cone toe touches Feet on ball K2C, rotations, bridges, isometric abs Yellow tband left hip abduction Active left hip ER Supine left hip extension green tband Crunches Passive stretch of the left LE PATIENT EDUCATION:  Education details: impact spasticity and inflexibility has on gait and patella tracking during ambulation; encouraged increase in stretch; consider botox or other appropriate intervention with MD regarding spasticity (if needed); updates to HEP Person educated: Patient and Caregiver   Education method: Explanation Education comprehension: verbalized understanding   HOME EXERCISE PROGRAM: [email or web address]  Access Code: [email or web address] URL: [email or web address] Date: 07/17/2022 Prepared by: Stacie Glaze  Exercises - Supine Hip Internal and External Rotation  - 1 x daily - 7 x weekly - 2 sets - 10 reps - 3 hold - Supine Hip Abduction on Slider  - 1 x daily - 7 x weekly - 2 sets - 10 reps - 3 hold - Hooklying Clamshell with Resistance  - 1 x daily - 7 x weekly - 2 sets - 10 reps - 3 hold - Hip Abduction and Adduction Caregiver PROM  - 1 x daily - 7 x weekly - 1 sets - 5 reps - 30-60  hold - Caregiver PROM Hip Internal External Rotation  - 1 x daily - 7 x weekly - 1 sets - 5 reps - 30-60 hold - Supine Short Arc Quad  - 1 x daily - 7 x weekly - 2 sets - 10 reps - 3 hold ASSESSMENT:   CLINICAL IMPRESSION: Patient had a fall last week.  She has a large bruise on the right thigh area, denies pain.  She reports tripped over someone's foot.  After stretching she did very well with walking with longer steps, tends to still throw the left hip forward.  OBJECTIVE IMPAIRMENTS: Abnormal gait, decreased balance, decreased coordination, decreased endurance, decreased mobility, difficulty walking,  decreased ROM, decreased strength, increased fascial restrictions, increased muscle spasms, impaired flexibility, impaired tone, improper body mechanics, and postural dysfunction.    ACTIVITY LIMITATIONS: standing, squatting, stairs, transfers, and locomotion level   PARTICIPATION LIMITATIONS: shopping, community activity, and occupation   PERSONAL FACTORS: 1 comorbidity: CP  are also affecting patient's functional outcome.    REHAB POTENTIAL: Good   CLINICAL DECISION MAKING: Stable/uncomplicated   EVALUATION COMPLEXITY: Moderate     GOALS: Goals reviewed with patient? Yes   SHORT TERM GOALS: Target date: 05/01/22 I with initial HEP Baseline: Goal status: MET   LONG TERM GOALS: Target date: 12/21/22   I with final HEP Baseline:  Goal status: progressing with her and caregiver 08/19/22   2.  Improve TUG time to < 12 sec Baseline:  Goal status: met 08/14/22  3.  Improve 5 x STS to <12 Baseline:  Goal status:MET 06/05/22   4.  Increase BERG score to at least 44 Baseline:  Goal status: 45/56 MET   5.  Patient will ambulate with normalized gait pattern, minimizing abnormal weight shifts and increasing LLE position to more neutral hip rotation. Baseline:  Goal status: ongoing 08/28/22, progressing 10/02/22, ongoing 11/13/22  6. Patient will report no falls in a 4 week period             Baseline: 5 falls in 8 days 11/04/22             Goal status: ongoing 11/13/22        PLAN:   PT FREQUENCY: 1-2x/week   PT DURATION: 10 weeks   PLANNED INTERVENTIONS: Therapeutic exercises, Therapeutic activity, Neuromuscular re-education, Balance training, Gait training, Patient/Family education, Self Care, Joint mobilization, Dry Needling, Cryotherapy, Moist heat, Ionotophoresis 4mg /ml Dexamethasone, and Manual therapy   PLAN FOR NEXT SESSION: work on hip ER and abduction as well as quad strength add more balance  4:53 PM,11/25/22 Stacie Glaze,  PT

## 2022-11-26 NOTE — Therapy (Signed)
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   Balance problem 05/06/2018   Syncope 05/06/2018   History of cerebrovascular accident 11/18/2017   CP (cerebral palsy), spastic, 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[name]) 06/25/2017   Anxiety state 06/25/2017   Spasticity 06/25/2017   Allergic rhinitis 10/21/2016   Mild intermittent asthma 10/21/2016      PCP: Clayborne Dana, NP   REFERRING PROVIDER: Clayborne Dana, NP   REFERRING DIAG: G80.0 (ICD-10-CM) - CP (cerebral palsy), spastic, 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[name])    THERAPY DIAG:  Spastic quadriplegic cerebral palsy ([name])   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)   Other lack of coordination   Unsteadiness on feet   Difficulty in walking, not elsewhere classified   Left-sided weakness   Muscle weakness (generalized)   Abnormal posture   Cerebral palsy with spastic diplegia ([name])   Rationale for Evaluation and Treatment: Rehabilitation   ONSET DATE: 03/17/22   SUBJECTIVE:    SUBJECTIVE STATEMENT: Patient [place], she is unsure of what happened reports that she turned and feels that she hit another foot of someone next to her and fell, reports that she was sore but doing well  PERTINENT HISTORY:     CP (cerebral palsy), spastic, 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[name]) - Primary         She is stable, at baseline  (ambulatory, but with with some gait abnormalities, balance issues, and weakness). Rechecking CBC, iron, and vitamin D today. She has been on supplementation (refilling iron for her). Will make additional changes based on labs if indicated. PT referral placed to [facility] at her request. No acute concerns today.      PAIN:  Are you having pain? No   PRECAUTIONS: None   WEIGHT BEARING RESTRICTIONS: No   FALLS:  Has patient fallen in last 6 months? Yes. 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 Hobbies- Runner, broadcasting/film/video, archery, baseball, tennis, dance    PLOF: Needs assistance with ADLs   PATIENT GOALS: Patient would like to improve her flexibility and balance.   NEXT MD VISIT:    OBJECTIVE:    DIAGNOSTIC FINDINGS:  Radiology consult confirms lumbar scoliosis with apex at L2-3, but since patient is asymptomatic, recommend monitoring it and continue to strengthen.           COGNITION: Overall cognitive status: Within functional limits for tasks assessed  SENSATION: Not tested     MUSCLE LENGTH: Hamstrings: Right 40 deg; Left 30 deg     POSTURE: left pelvic obliquity, weight shift right, and hips rotated to L with L LE held in IR, increased R WB     LOWER EXTREMITY ROM: Trunk lateraly flexed due to scoliosis, L hip unable to achieve neutral ER, abd limited, B hamstrings tight, L ankle does not achieve neutral DF or inversion. LUE with limited ROM at all joints, in all planes. she wears a splint on her wrist at night.      LOWER EXTREMITY MMT: RLE 4/5, LLE with fluctuating tone throughout, HS very weak, hips also weak-MMT accuracy questionable due to her tone.   FUNCTIONAL TESTS:  5 times sit to stand: 14.77,  07/03/22 13 seconds, 09/30/22 = 12 seconds Timed  up and go (TUG): 13.75 , 07/03/22 = 10 seconds, 09/30/22 9 seconds Berg Balance Scale: 48/56  10/21/22 = 49/56   GAIT: Distance walked: clinic distances Assistive device utilized: None Level of assistance: Modified independence Comments: Step-to pattern;Decreased step length - left;Decreased stance time - left;Decreased weight shift to left;Shuffle;Ataxic;Lateral hip instability;Trunk rotated posteriorly on left      TODAY'S TREATMENT:                                                                                                              DATE:  11/27/22 NuStep STS on airex Leg ext HS curls  Step ups  Stairs step over step up and down Side and forward steps over foam beam Standing hip ext 2#     11/25/22 Nustep level 5 x 6 minutes Standing balance volleyball and then on the airex Leg press 20# x10, 40# x10, 60# x10, then 40# at a lower spot for increase ROM, then left leg 20# 2x10 Left leg HS curls 10# 2x10 Left leg only extension 5# 2x10 5# on airex row and extension with SBA Side step on and off airex Stairs step over step up and down [employer] tband hip extension Green tband clamshells Sidelying clamshells Passive left LE stretches crunches  11/13/22 NuStep L5 x79mins  Supine bridges and then bridges with PT holding the left leg up 2x10  Passive stretch to the left hip Feet on ball K2C, rotations, bridges x10  On airex cone toe touches in II bars  Step ups 2x10 leading with each side  STS x10, then x10 with ball hit   11/04/22 NuStep L5 x87mins  Lunges on steps 2x10 Leg press 20# 2x10, LLE only x5 STS on airex, CGA falls back into heels a little bit  Leg ext 5# BLE 2x10, 5# LLE x5 Ball kicks x10 each side  Side steps on beam    10/30/22 NuStep L5 x62mins  Leg ext 5# ankle weight 2x10 HS curls green 2x10 Seated ER with red band x10 each side, very minimal activation Standing on airex marching  Heel raises on airex  Side stepping over obstacles, minA   On airex reaching different directions, CGA  On airex volleyball  hits   10/28/22 Nustep level 5 x 6 minutes Side step on and off airex On bosu balancing 5# marches 2x10 5# hip abduction 2x10 Direction changes Volleyball Supine bridges and then bridges with PT holding the left leg up Crunches Passive stretch to the left hip Leg press 20# 2x10 each leg Leg extension 5# left x 10, then both x10 Cone toe touches Ball kicks  10/23/22 NuStep L5 x49mins  Hip bridges 2x10  Prone hip ext RLE 2x10  Passive stretching oh LE  Lunges on BOSU 20 reps alt in II bars  Standing on BOSU rocking back and forth and side to side  Volleyball hits with steps  5# AW marching 20 reps alt  LAQ 5# 2x10  Side steps with 5# on [facility]    10/21/22 Nustep level 5 x 6 minutes Berg 49/56 TUG 10 seconds 6" alternating toe touches Right foot propped up on the 6" step and trying to balance on the left needing min-mod A Side stepping, fwd and backward walking Leg press 40# both legs, left only 20# and then 30# On airex ball taps back and forth Standing on bosu upside down in pbars with Min A Supine passive left hip stretches Side lying clams and hip abduction with assist Rocker board two ways in pbars Gait outside curbs, slopes grass Bridge and then left leg bridge  10/02/22 Bike level 4 x 5 minutes Gait outside today did not need rest as we walked around the back parking [employer] on airex Leg press 40# 2x10 ball b/n knees Then left leg only 2 x10 Sliding board supine hip abduction 2.5# on the ankle Hooklying left leg abduction against yellow tband Supine blue tband left leg extension Bridges Left leg ER and hold trying to keep toes to ceiling Sidelying abduction Supine straight leg abduction Left leg only bridges Passive stretch left LE  09/30/22 Nustep level 5 x 6 minutes Gait outside in the grass, curbs uneven ground SRA/CGA On airex side stepping Stairs step over step Volley  ball Direction changes On airex cone toe touches Feet on ball K2C, rotations, bridges, isometric abs Yellow tband left hip abduction Active left hip ER Supine left hip extension green tband Crunches Passive stretch of the left LE PATIENT EDUCATION:  Education details: impact spasticity and inflexibility has on gait and patella tracking during ambulation; encouraged increase in stretch; consider botox or other appropriate intervention with MD regarding spasticity (if needed); updates to HEP Person educated: Patient and Caregiver   Education method: Explanation Education comprehension: verbalized understanding   HOME EXERCISE PROGRAM: [email or web address]  Access Code: [email or web address] URL: [email or web address] Date: 07/17/2022 Prepared by: Stacie Glaze  Exercises - Supine Hip Internal and External Rotation  - 1 x daily - 7 x weekly - 2 sets - 10 reps - 3 hold - Supine Hip Abduction on Slider  - 1 x daily - 7 x weekly - 2 sets - 10 reps - 3 hold - Hooklying Clamshell with Resistance  - 1 x daily - 7 x weekly - 2 sets - 10 reps - 3 hold - Hip Abduction and Adduction Caregiver PROM  - 1 x daily - 7 x weekly - 1 sets - 5 reps - 30-60 hold - Caregiver PROM Hip Internal External Rotation  - 1 x daily - 7 x weekly - 1 sets - 5 reps - 30-60 hold - Supine Short Arc Quad  - 1 x daily - 7 x weekly - 2 sets - 10 reps - 3  hold ASSESSMENT:   CLINICAL IMPRESSION: Patient had a fall last week.  She has a large bruise on the right thigh area, denies pain.  She reports tripped over someone's foot.  After stretching she did very well with walking with longer steps, tends to still throw the left hip forward.  OBJECTIVE IMPAIRMENTS: Abnormal gait, decreased balance, decreased coordination, decreased endurance, decreased mobility, difficulty walking, decreased ROM, decreased strength, increased fascial restrictions, increased muscle spasms, impaired flexibility, impaired tone, improper body mechanics, and  postural dysfunction.    ACTIVITY LIMITATIONS: standing, squatting, stairs, transfers, and locomotion level   PARTICIPATION LIMITATIONS: shopping, community activity, and occupation   PERSONAL FACTORS: 1 comorbidity: CP  are also affecting patient's functional outcome.    REHAB POTENTIAL: Good   CLINICAL DECISION MAKING: Stable/uncomplicated   EVALUATION COMPLEXITY: Moderate     GOALS: Goals reviewed with patient? Yes   SHORT TERM GOALS: Target date: 05/01/22 I with initial HEP Baseline: Goal status: MET   LONG TERM GOALS: Target date: 12/21/22   I with final HEP Baseline:  Goal status: progressing with her and caregiver 08/19/22   2.  Improve TUG time to < 12 sec Baseline:  Goal status: met 08/14/22  3.  Improve 5 x STS to <12 Baseline:  Goal status:MET 06/05/22   4.  Increase BERG score to at least 44 Baseline:  Goal status: 45/56 MET   5.  Patient will ambulate with normalized gait pattern, minimizing abnormal weight shifts and increasing LLE position to more neutral hip rotation. Baseline:  Goal status: ongoing 08/28/22, progressing 10/02/22, ongoing 11/13/22  6. Patient will report no falls in a 4 week period             Baseline: 5 falls in 8 days 11/04/22             Goal status: ongoing 11/13/22        PLAN:   PT FREQUENCY: 1-2x/week   PT DURATION: 10 weeks   PLANNED INTERVENTIONS: Therapeutic exercises, Therapeutic activity, Neuromuscular re-education, Balance training, Gait training, Patient/Family education, Self Care, Joint mobilization, Dry Needling, Cryotherapy, Moist heat, Ionotophoresis 4mg /ml Dexamethasone, and Manual therapy   PLAN FOR NEXT SESSION: work on hip ER and abduction as well as quad strength add more balance  3:38 PM,11/26/22 Stacie Glaze,  PT

## 2022-11-27 ENCOUNTER — Ambulatory Visit: Payer: BC Managed Care – PPO

## 2022-11-27 DIAGNOSIS — M6281 Muscle weakness (generalized): Secondary | ICD-10-CM | POA: Diagnosis not present

## 2022-11-27 DIAGNOSIS — R2689 Other abnormalities of gait and mobility: Secondary | ICD-10-CM

## 2022-11-27 DIAGNOSIS — G801 Spastic diplegic cerebral palsy: Secondary | ICD-10-CM

## 2022-11-27 DIAGNOSIS — R531 Weakness: Secondary | ICD-10-CM

## 2022-11-27 DIAGNOSIS — R278 Other lack of coordination: Secondary | ICD-10-CM

## 2022-11-27 DIAGNOSIS — G800 Spastic quadriplegic cerebral palsy: Secondary | ICD-10-CM

## 2022-11-27 DIAGNOSIS — R2681 Unsteadiness on feet: Secondary | ICD-10-CM

## 2022-11-27 DIAGNOSIS — R262 Difficulty in walking, not elsewhere classified: Secondary | ICD-10-CM

## 2022-12-01 ENCOUNTER — Encounter: Payer: Self-pay | Admitting: Family Medicine

## 2022-12-01 ENCOUNTER — Ambulatory Visit (INDEPENDENT_AMBULATORY_CARE_PROVIDER_SITE_OTHER): Payer: BC Managed Care – PPO | Admitting: Family Medicine

## 2022-12-01 VITALS — BP 122/77 | HR 89 | Ht 64.75 in | Wt 109.0 lb

## 2022-12-01 DIAGNOSIS — G802 Spastic hemiplegic cerebral palsy: Secondary | ICD-10-CM | POA: Diagnosis not present

## 2022-12-01 NOTE — Progress Notes (Signed)
Established Patient Office Visit  Subjective   Patient ID: Kerri Robertson, female    DOB: Apr 05, 2001  Age: 22 y.o. MRN: [id number]  Chief Complaint  Patient presents with   Medical Management of Chronic Issues    HPI  Discussed the use of AI scribe software for clinical note transcription with the patient, who gave verbal consent to proceed.  History of Present Illness   The patient, with a known history of allergies to lactose, benzos, and latex, presents for a routine physical examination and completion of a form for an upcoming week at [facility]. They report no current health issues and deny any recent chest pain, shortness of breath, unusual headaches, vision changes, rashes, musculoskeletal issues, cough, wheezing, nausea, vomiting, diarrhea, fever, chills, or urinary difficulties.  They have a history of multiple surgeries, particularly on the upper and lower extremities. 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 They express a desire to return to occupational therapy (OT) at [facility], where they are currently receiving physical therapy (PT).          ROS All review of systems negative except what is listed in the HPI    Objective:     BP 122/77   Pulse 89   Ht 5' 4.75" (1.645 m)   Wt 109 lb (49.4 kg)   SpO2 97%   BMI 18.28 kg/m    Physical Exam Vitals reviewed.  Constitutional:      General: She is not in acute distress.    Appearance: Normal appearance. She is normal weight. She is not ill-appearing.  HENT:     Head: Normocephalic and atraumatic.  Cardiovascular:     Rate and Rhythm: Normal rate and regular rhythm.     Pulses: Normal pulses.     Heart sounds: Normal heart sounds.  Pulmonary:     Effort: Pulmonary effort is normal.     Breath sounds: Normal breath sounds.  Musculoskeletal:     Cervical back:  Normal range of motion and neck supple. No tenderness.     Right lower leg: No edema.     Left lower leg: No edema.     Comments: LUE contracture, LLE mild malalignment; left hemiparesis/spasticity   Lymphadenopathy:     Cervical: No cervical adenopathy.  Skin:    General: Skin is warm and dry.  Neurological:     Mental Status: She is alert and oriented to person, place, and time. Mental status is at baseline.     Cranial Nerves: No cranial nerve deficit.  Psychiatric:        Mood and Affect: Mood normal.        Behavior: Behavior normal.        Thought Content: Thought content normal.        Judgment: Judgment normal.      No results found for any visits on 12/01/22.    The ASCVD Risk score (Arnett DK, et al., 2019) failed to calculate for the following reasons:   The 2019 ASCVD risk score is only valid for ages 73 to 52   The patient has a prior MI or stroke diagnosis    Assessment & Plan:   Problem List Items Addressed This Visit     CP (cerebral palsy) ([name]) - Primary (Chronic)    She is doing well overall and has been enjoying her summer  so far. No acute concerns. Currently at baseline. OT referral placed.       Relevant Orders   Ambulatory referral to Occupational Therapy    Return if symptoms worsen or fail to improve.    Clayborne Dana, NP

## 2022-12-01 NOTE — Assessment & Plan Note (Signed)
>>  ASSESSMENT AND PLAN FOR CP (CEREBRAL PALSY) ([name]) WRITTEN ON 12/01/2022  3:55 PM BY Sri Clegg B, NP  She is doing well overall and has been enjoying her summer so far. No acute concerns. Currently at baseline. OT referral placed.

## 2022-12-01 NOTE — Assessment & Plan Note (Signed)
She is doing well overall and has been enjoying her summer so far. No acute concerns. Currently at baseline. OT referral placed.

## 2022-12-02 ENCOUNTER — Ambulatory Visit: Payer: BC Managed Care – PPO | Admitting: Physical Therapy

## 2022-12-02 ENCOUNTER — Encounter: Payer: Self-pay | Admitting: Physical Therapy

## 2022-12-02 DIAGNOSIS — R262 Difficulty in walking, not elsewhere classified: Secondary | ICD-10-CM

## 2022-12-02 DIAGNOSIS — G801 Spastic diplegic cerebral palsy: Secondary | ICD-10-CM

## 2022-12-02 DIAGNOSIS — M6281 Muscle weakness (generalized): Secondary | ICD-10-CM | POA: Diagnosis not present

## 2022-12-02 DIAGNOSIS — R2689 Other abnormalities of gait and mobility: Secondary | ICD-10-CM

## 2022-12-02 DIAGNOSIS — R2681 Unsteadiness on feet: Secondary | ICD-10-CM

## 2022-12-02 DIAGNOSIS — R531 Weakness: Secondary | ICD-10-CM

## 2022-12-02 DIAGNOSIS — G800 Spastic quadriplegic cerebral palsy: Secondary | ICD-10-CM

## 2022-12-02 NOTE — Therapy (Signed)
OUTPATIENT PHYSICAL THERAPY TREATMENT   Patient Name: Kerri Robertson MRN: [id number] DOB:July 29, 2000, 22 y.o., female Today's Date: 04/10/2022   END OF SESSION:  PT End of Session - 12/02/22 1658     Visit Number 43    Date for PT Re-Evaluation 12/21/22    PT Start Time 1656    PT Stop Time 1739    PT Time Calculation (min) 43 min    Activity Tolerance Patient tolerated treatment well    Behavior During Therapy WFL for tasks assessed/performed                      Past Medical History:  Diagnosis Date   Allergy     Cerebral palsy ([name])     Stroke ([name])           Past Surgical History:  Procedure Laterality Date   ARM SURGERY        2013   FOOT SURGERY        2010   HAMSTRING LENGTHENING       TIBIA / FIBIA LENGTHENING            Patient Active Problem List    Diagnosis Date Noted   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   Balance problem 05/06/2018   Syncope 05/06/2018   History of cerebrovascular accident 11/18/2017   CP (cerebral palsy), spastic, 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[name]) 06/25/2017   Anxiety state 06/25/2017   Spasticity 06/25/2017   Allergic rhinitis 10/21/2016   Mild intermittent asthma 10/21/2016      PCP: Clayborne Dana, NP   REFERRING PROVIDER: Clayborne Dana, NP   REFERRING DIAG: G80.0 (ICD-10-CM) - CP (cerebral palsy), spastic, 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[name])    THERAPY DIAG:  Spastic quadriplegic cerebral palsy ([name])   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)   Other lack of coordination   Unsteadiness on feet   Difficulty in walking, not elsewhere classified   Left-sided weakness   Muscle weakness (generalized)   Abnormal posture   Cerebral palsy with spastic diplegia ([name])   Rationale for Evaluation and Treatment: Rehabilitation   ONSET DATE: 03/17/22   SUBJECTIVE:    SUBJECTIVE STATEMENT: Patient is doing well, mom  reports she has been approved for the next steps of [employer].Has new referral for OT, I introduced her to the OT  PERTINENT HISTORY:     CP (cerebral palsy), spastic, 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[name]) - Primary         She is stable, at baseline (ambulatory, but with with some gait abnormalities, balance issues, and weakness). Rechecking CBC, iron, and vitamin D today. She has been on supplementation (refilling iron for her). Will make additional changes based on labs if indicated. PT referral placed to [facility] at her request. No acute concerns today.      PAIN:  Are you having pain? No   PRECAUTIONS: None   WEIGHT BEARING RESTRICTIONS: No   FALLS:  Has patient fallen in last 6 months? Yes. 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 Hobbies- Runner, broadcasting/film/video, archery, baseball, tennis, dance    PLOF: Needs assistance with ADLs   PATIENT GOALS: Patient would like to improve her flexibility and balance.   NEXT MD VISIT:    OBJECTIVE:  DIAGNOSTIC FINDINGS:  Radiology consult confirms lumbar scoliosis with apex at L2-3, but since patient is asymptomatic, recommend monitoring it and continue to strengthen.           COGNITION: Overall cognitive status: Within functional limits for tasks assessed                         SENSATION: Not tested     MUSCLE LENGTH: Hamstrings: Right 40 deg; Left 30 deg     POSTURE: left pelvic obliquity, weight shift right, and hips rotated to L with L LE held in IR, increased R WB     LOWER EXTREMITY ROM: Trunk lateraly flexed due to scoliosis, L hip unable to achieve neutral ER, abd limited, B hamstrings tight, L ankle does not achieve neutral DF or inversion. LUE with limited ROM at all joints, in all planes. she wears a splint on her wrist at night.      LOWER  EXTREMITY MMT: RLE 4/5, LLE with fluctuating tone throughout, HS very weak, hips also weak-MMT accuracy questionable due to her tone.   FUNCTIONAL TESTS:  5 times sit to stand: 14.77,  07/03/22 13 seconds, 09/30/22 = 12 seconds Timed up and go (TUG): 13.75 , 07/03/22 = 10 seconds, 09/30/22 9 seconds Berg Balance Scale: 48/56  10/21/22 = 49/56   GAIT: Distance walked: clinic distances Assistive device utilized: None Level of assistance: Modified independence Comments: Step-to pattern;Decreased step length - left;Decreased stance time - left;Decreased weight shift to left;Shuffle;Ataxic;Lateral hip instability;Trunk rotated posteriorly on left      TODAY'S TREATMENT:                                                                                                              DATE:  12/02/22 Nustep level 6 x 6 minutes On airex 5# arm extension and row right side Side step on and off airex Seated and standing volleyball Leg press 40# both legs 2x10, 20# left only 2x10 Right arm 8# farmer carry, right arm 4# overhead carry On bosu balance and rockerboard in Pbars Side stepping over bars, walking over bars Bridges Crunches PROM stretches of the left LE  11/27/22 NuStep L5 x43mins  STS on airex 2x10 SBA  Step ups 6"  Stairs step over step up and down Side and forward steps over foam beams Standing hip ext 2# 2x10 Standing at side of stairs heel taps with 2# 2x10 Left leg HS curls 10# 2x10 Left leg only extension 5# 2x10   11/25/22 Nustep level 5 x 6 minutes Standing balance volleyball and then on the airex Leg press 20# x10, 40# x10, 60# x10, then 40# at a lower spot for increase ROM, then left leg 20# 2x10 Left leg HS curls 10# 2x10 Left leg only extension 5# 2x10 5# on airex row and extension with SBA Side step on and off airex Stairs step over step up and down [employer] tband hip extension Green tband clamshells Sidelying clamshells Passive left LE  stretches crunches  11/13/22 NuStep L5 x68mins  Supine bridges and then bridges with PT holding the left leg up 2x10  Passive stretch to the left hip Feet on ball K2C, rotations, bridges x10  On airex cone toe touches in II bars  Step ups 2x10 leading with each side  STS x10, then x10 with ball hit   11/04/22 NuStep L5 x103mins  Lunges on steps 2x10 Leg press 20# 2x10, LLE only x5 STS on airex, CGA falls back into heels a little bit  Leg ext 5# BLE 2x10, 5# LLE x5 Ball kicks x10 each side  Side steps on beam    10/30/22 NuStep L5 x59mins  Leg ext 5# ankle weight 2x10 HS curls green 2x10 Seated ER with red band x10 each side, very minimal activation Standing on airex marching  Heel raises on airex  Side stepping over obstacles, minA  On airex reaching different directions, CGA  On airex volleyball hits   10/28/22 Nustep level 5 x 6 minutes Side step on and off airex On bosu balancing 5# marches 2x10 5# hip abduction 2x10 Direction changes Volleyball Supine bridges and then bridges with PT holding the left leg up Crunches Passive stretch to the left hip Leg press 20# 2x10 each leg Leg extension 5# left x 10, then both x10 Cone toe touches Ball kicks  10/23/22 NuStep L5 x29mins  Hip bridges 2x10  Prone hip ext RLE 2x10  Passive stretching oh LE  Lunges on BOSU 20 reps alt in II bars  Standing on BOSU rocking back and forth and side to side  Volleyball hits with steps  5# AW marching 20 reps alt  LAQ 5# 2x10  Side steps with 5# on [facility]    10/21/22 Nustep level 5 x 6 minutes Berg 49/56 TUG 10 seconds 6" alternating toe touches Right foot propped up on the 6" step and trying to balance on the left needing min-mod A Side stepping, fwd and backward walking Leg press 40# both legs, left only 20# and then 30# On airex ball taps back and forth Standing on bosu upside down in pbars with Min A Supine passive left hip stretches Side lying clams and hip abduction with  assist Rocker board two ways in pbars Gait outside curbs, slopes grass Bridge and then left leg bridge  10/02/22 Bike level 4 x 5 minutes Gait outside today did not need rest as we walked around the back parking [employer] on airex Leg press 40# 2x10 ball b/n knees Then left leg only 2 x10 Sliding board supine hip abduction 2.5# on the ankle Hooklying left leg abduction against yellow tband Supine blue tband left leg extension Bridges Left leg ER and hold trying to keep toes to ceiling Sidelying abduction Supine straight leg abduction Left leg only bridges Passive stretch left LE  09/30/22 Nustep level 5 x 6 minutes Gait outside in the grass, curbs uneven ground SRA/CGA On airex side stepping Stairs step over step Volley ball Direction changes On airex cone toe touches Feet on ball K2C, rotations, bridges, isometric abs Yellow tband left hip abduction Active left hip ER Supine left hip extension green tband Crunches Passive stretch of the left LE PATIENT EDUCATION:  Education details: impact spasticity and inflexibility has on gait and patella tracking during ambulation; encouraged increase in stretch; consider botox or other appropriate intervention with MD regarding spasticity (if needed); updates to HEP Person educated: Patient and Caregiver   Education method: Explanation Education comprehension: verbalized understanding   HOME EXERCISE  PROGRAM: [email or web address]  Access Code: [email or web address] URL: [email or web address] Date: 07/17/2022 Prepared by: Stacie Glaze  Exercises - Supine Hip Internal and External Rotation  - 1 x daily - 7 x weekly - 2 sets - 10 reps - 3 hold - Supine Hip Abduction on Slider  - 1 x daily - 7 x weekly - 2 sets - 10 reps - 3 hold - Hooklying Clamshell with Resistance  - 1 x daily - 7 x weekly - 2 sets - 10 reps - 3 hold - Hip Abduction and Adduction Caregiver PROM  - 1 x daily - 7 x weekly - 1 sets - 5 reps - 30-60 hold -  Caregiver PROM Hip Internal External Rotation  - 1 x daily - 7 x weekly - 1 sets - 5 reps - 30-60 hold - Supine Short Arc Quad  - 1 x daily - 7 x weekly - 2 sets - 10 reps - 3 hold ASSESSMENT:   CLINICAL IMPRESSION: Patient has been approved for [facility] for LUE. She denies any falls, she is working about 4 hours 3 days a week, she is also doing some camps and will travel in the next few weeks.  She is staying very active and recently hsa not had a fall.  She is feeling stronger and I feel that there is more motion at the hip, .   OBJECTIVE IMPAIRMENTS: Abnormal gait, decreased balance, decreased coordination, decreased endurance, decreased mobility, difficulty walking, decreased ROM, decreased strength, increased fascial restrictions, increased muscle spasms, impaired flexibility, impaired tone, improper body mechanics, and postural dysfunction.    ACTIVITY LIMITATIONS: standing, squatting, stairs, transfers, and locomotion level   PARTICIPATION LIMITATIONS: shopping, community activity, and occupation   PERSONAL FACTORS: 1 comorbidity: CP  are also affecting patient's functional outcome.    REHAB POTENTIAL: Good   CLINICAL DECISION MAKING: Stable/uncomplicated   EVALUATION COMPLEXITY: Moderate     GOALS: Goals reviewed with patient? Yes   SHORT TERM GOALS: Target date: 05/01/22 I with initial HEP Baseline: Goal status: MET   LONG TERM GOALS: Target date: 12/21/22   I with final HEP Baseline:  Goal status: progressing with her and caregiver 08/19/22   2.  Improve TUG time to < 12 sec Baseline:  Goal status: met 08/14/22  3.  Improve 5 x STS to <12 Baseline:  Goal status:MET 06/05/22   4.  Increase BERG score to at least 44 Baseline:  Goal status: 45/56 MET   5.  Patient will ambulate with normalized gait pattern, minimizing abnormal weight shifts and increasing LLE position to more neutral hip rotation. Baseline:  Goal status: ongoing 08/28/22, progressing 10/02/22,  ongoing 11/13/22  6. Patient will report no falls in a 4 week period             Baseline: 5 falls in 8 days 11/04/22             Goal status: ongoing 11/13/22        PLAN:   PT FREQUENCY: 1-2x/week   PT DURATION: 10 weeks   PLANNED INTERVENTIONS: Therapeutic exercises, Therapeutic activity, Neuromuscular re-education, Balance training, Gait training, Patient/Family education, Self Care, Joint mobilization, Dry Needling, Cryotherapy, Moist heat, Ionotophoresis 4mg /ml Dexamethasone, and Manual therapy   PLAN FOR NEXT SESSION: work on hip ER and abduction as well as quad strength add more balance  4:59 PM,12/02/22 Stacie Glaze,  PT

## 2022-12-04 ENCOUNTER — Ambulatory Visit: Payer: BC Managed Care – PPO | Attending: Neurology | Admitting: Physical Therapy

## 2022-12-04 ENCOUNTER — Encounter: Payer: Self-pay | Admitting: Physical Therapy

## 2022-12-04 DIAGNOSIS — I69954 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: Secondary | ICD-10-CM | POA: Diagnosis present

## 2022-12-04 DIAGNOSIS — R262 Difficulty in walking, not elsewhere classified: Secondary | ICD-10-CM

## 2022-12-04 DIAGNOSIS — G801 Spastic diplegic cerebral palsy: Secondary | ICD-10-CM

## 2022-12-04 DIAGNOSIS — G800 Spastic quadriplegic cerebral palsy: Secondary | ICD-10-CM | POA: Insufficient documentation

## 2022-12-04 DIAGNOSIS — R2689 Other abnormalities of gait and mobility: Secondary | ICD-10-CM | POA: Diagnosis present

## 2022-12-04 DIAGNOSIS — R293 Abnormal posture: Secondary | ICD-10-CM | POA: Diagnosis present

## 2022-12-04 DIAGNOSIS — M6281 Muscle weakness (generalized): Secondary | ICD-10-CM | POA: Diagnosis present

## 2022-12-04 DIAGNOSIS — R531 Weakness: Secondary | ICD-10-CM | POA: Diagnosis present

## 2022-12-04 DIAGNOSIS — R278 Other lack of coordination: Secondary | ICD-10-CM | POA: Diagnosis present

## 2022-12-04 DIAGNOSIS — R2681 Unsteadiness on feet: Secondary | ICD-10-CM | POA: Diagnosis present

## 2022-12-04 NOTE — Therapy (Signed)
OUTPATIENT PHYSICAL THERAPY TREATMENT   Patient Name: Kerri Robertson MRN: [id number] DOB:01/14/2001, 22 y.o., female Today's Date: 04/10/2022   END OF SESSION:  PT End of Session - 12/04/22 1612     Visit Number 44    Date for PT Re-Evaluation 12/21/22    PT Start Time 1610    PT Stop Time 1700    PT Time Calculation (min) 50 min    Activity Tolerance Patient tolerated treatment well    Behavior During Therapy WFL for tasks assessed/performed                      Past Medical History:  Diagnosis Date   Allergy     Cerebral palsy ([name])     Stroke [facility])           Past Surgical History:  Procedure Laterality Date   ARM SURGERY        2013   FOOT SURGERY        2010   HAMSTRING LENGTHENING       TIBIA / FIBIA LENGTHENING            Patient Active Problem List    Diagnosis Date Noted   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   Balance problem 05/06/2018   Syncope 05/06/2018   History of cerebrovascular accident 11/18/2017   CP (cerebral palsy), spastic, 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[name]) 06/25/2017   Anxiety state 06/25/2017   Spasticity 06/25/2017   Allergic rhinitis 10/21/2016   Mild intermittent asthma 10/21/2016      PCP: Clayborne Dana, NP   REFERRING PROVIDER: Clayborne Dana, NP   REFERRING DIAG: G80.0 (ICD-10-CM) - CP (cerebral palsy), spastic, 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[name])    THERAPY DIAG:  Spastic quadriplegic cerebral palsy ([name])   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 Rechecking CBC, iron, and vitamin D today. She has been on supplementation (refilling iron for her). Will make additional changes based on labs if indicated. PT referral placed to [facility] at her request. No acute concerns today.      PAIN:  Are you having pain? No   PRECAUTIONS: None   WEIGHT BEARING RESTRICTIONS: No   FALLS:  Has patient fallen in last 6 months? Yes. 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 Hobbies- Runner, broadcasting/film/video, archery, baseball, tennis, dance    PLOF: Needs assistance with ADLs   PATIENT GOALS: Patient would like to improve her flexibility and balance.   NEXT MD VISIT:    OBJECTIVE:    DIAGNOSTIC FINDINGS:  Radiology consult confirms lumbar scoliosis with apex at L2-3, but since patient is asymptomatic, recommend  monitoring it and continue to strengthen.           COGNITION: Overall cognitive status: Within functional limits for tasks assessed                         SENSATION: Not tested     MUSCLE LENGTH: Hamstrings: Right 40 deg; Left 30 deg     POSTURE: left pelvic obliquity, weight shift right, and hips rotated to L with L LE held in IR, increased R WB     LOWER EXTREMITY ROM: Trunk lateraly flexed due to scoliosis, L hip unable to achieve neutral ER, abd limited, B hamstrings tight, L ankle does not achieve neutral DF or inversion. LUE with limited ROM at all joints, in all planes. she wears a splint on her wrist at night.      LOWER EXTREMITY MMT: RLE 4/5, LLE with fluctuating tone throughout, HS very weak, hips also weak-MMT accuracy  questionable due to her tone.   FUNCTIONAL TESTS:  5 times sit to stand: 14.77,  07/03/22 13 seconds, 09/30/22 = 12 seconds Timed up and go (TUG): 13.75 , 07/03/22 = 10 seconds, 09/30/22 9 seconds Berg Balance Scale: 48/56  10/21/22 = 49/56   GAIT: Distance walked: clinic distances Assistive device utilized: None Level of assistance: Modified independence Comments: Step-to pattern;Decreased step length - left;Decreased stance time - left;Decreased weight shift to left;Shuffle;Ataxic;Lateral hip instability;Trunk rotated posteriorly on left      TODAY'S TREATMENT:                                                                                                              DATE:  12/04/22 Nustep level 5 x 6 minutes 10# right arm row 5# right arm extension 5# AR press  Ball toss and hits, with some stepping Ball kicks Leg press 50# both 2x10, left only 30# 2x10 Side stepping Walking ball taps On bosu balance PROM left LE Yellow tband hip extension and abduction on the left in supine  12/02/22 Nustep level 6 x 6 minutes On airex 5# arm extension and row right side Side step on and off airex Seated and standing volleyball Leg press 40# both legs 2x10, 20# left only 2x10 Right arm 8# farmer carry, right arm 4# overhead carry On bosu balance and rockerboard in Pbars Side stepping over bars, walking over bars Bridges Crunches PROM stretches of the left LE  11/27/22 NuStep L5 x21mins  STS on airex 2x10 SBA  Step ups 6"  Stairs step over step up and down Side and forward steps over foam beams Standing hip ext 2# 2x10 Standing at side of stairs heel taps with 2# 2x10 Left leg HS curls 10# 2x10 Left leg only extension 5# 2x10   11/25/22 Nustep level 5 x 6 minutes Standing balance volleyball and then on the airex Leg press 20# x10, 40# x10, 60# x10, then 40# at a lower spot for increase ROM, then left leg 20# 2x10 Left leg HS curls 10# 2x10 Left leg  only extension 5# 2x10 5# on  airex row and extension with SBA Side step on and off airex Stairs step over step up and down Marcene Duos tband hip extension Green tband clamshells Sidelying clamshells Passive left LE stretches crunches  11/13/22 NuStep L5 x11mins  Supine bridges and then bridges with PT holding the left leg up 2x10  Passive stretch to the left hip Feet on ball K2C, rotations, bridges x10  On airex cone toe touches in II bars  Step ups 2x10 leading with each side  STS x10, then x10 with ball hit   11/04/22 NuStep L5 x61mins  Lunges on steps 2x10 Leg press 20# 2x10, LLE only x5 STS on airex, CGA falls back into heels a little bit  Leg ext 5# BLE 2x10, 5# LLE x5 Ball kicks x10 each side  Side steps on beam    10/30/22 NuStep L5 x68mins  Leg ext 5# ankle weight 2x10 HS curls green 2x10 Seated ER with red band x10 each side, very minimal activation Standing on airex marching  Heel raises on airex  Side stepping over obstacles, minA  On airex reaching different directions, CGA  On airex volleyball hits   10/28/22 Nustep level 5 x 6 minutes Side step on and off airex On bosu balancing 5# marches 2x10 5# hip abduction 2x10 Direction changes Volleyball Supine bridges and then bridges with PT holding the left leg up Crunches Passive stretch to the left hip Leg press 20# 2x10 each leg Leg extension 5# left x 10, then both x10 Cone toe touches Ball kicks  10/23/22 NuStep L5 x66mins  Hip bridges 2x10  Prone hip ext RLE 2x10  Passive stretching oh LE  Lunges on BOSU 20 reps alt in II bars  Standing on BOSU rocking back and forth and side to side  Volleyball hits with steps  5# AW marching 20 reps alt  LAQ 5# 2x10  Side steps with 5# on [facility]    10/21/22 Nustep level 5 x 6 minutes Berg 49/56 TUG 10 seconds 6" alternating toe touches Right foot propped up on the 6" step and trying to balance on the left needing min-mod A Side stepping, fwd and backward walking Leg press 40# both  legs, left only 20# and then 30# On airex ball taps back and forth Standing on bosu upside down in pbars with Min A Supine passive left hip stretches Side lying clams and hip abduction with assist Rocker board two ways in pbars Gait outside curbs, slopes grass Bridge and then left leg bridge PATIENT EDUCATION:  Education details: impact spasticity and inflexibility has on gait and patella tracking during ambulation; encouraged increase in stretch; consider botox or other appropriate intervention with MD regarding spasticity (if needed); updates to HEP Person educated: Patient and Caregiver   Education method: Explanation Education comprehension: verbalized understanding   HOME EXERCISE PROGRAM: [email or web address]  Access Code: [email or web address] URL: [email or web address] Date: 07/17/2022 Prepared by: Stacie Glaze  Exercises - Supine Hip Internal and External Rotation  - 1 x daily - 7 x weekly - 2 sets - 10 reps - 3 hold - Supine Hip Abduction on Slider  - 1 x daily - 7 x weekly - 2 sets - 10 reps - 3 hold - Hooklying Clamshell with Resistance  - 1 x daily - 7 x weekly - 2 sets - 10 reps - 3 hold - Hip Abduction and Adduction Caregiver PROM  - 1 x daily - 7 x weekly - 1 sets - 5  reps - 30-60 hold - Caregiver PROM Hip Internal External Rotation  - 1 x daily - 7 x weekly - 1 sets - 5 reps - 30-60 hold - Supine Short Arc Quad  - 1 x daily - 7 x weekly - 2 sets - 10 reps - 3 hold ASSESSMENT:   CLINICAL IMPRESSION: Patient tolerated the exercises without difficulty, she is walking better and smoother less clunky.  I did focus a lot on the stretches today and shen she got up she really did well with the walking less toe in and adduction  OBJECTIVE IMPAIRMENTS: Abnormal gait, decreased balance, decreased coordination, decreased endurance, decreased mobility, difficulty walking, decreased ROM, decreased strength, increased fascial restrictions, increased muscle spasms, impaired flexibility,  impaired tone, improper body mechanics, and postural dysfunction.    ACTIVITY LIMITATIONS: standing, squatting, stairs, transfers, and locomotion level   PARTICIPATION LIMITATIONS: shopping, community activity, and occupation   PERSONAL FACTORS: 1 comorbidity: CP  are also affecting patient's functional outcome.    REHAB POTENTIAL: Good   CLINICAL DECISION MAKING: Stable/uncomplicated   EVALUATION COMPLEXITY: Moderate     GOALS: Goals reviewed with patient? Yes   SHORT TERM GOALS: Target date: 05/01/22 I with initial HEP Baseline: Goal status: MET   LONG TERM GOALS: Target date: 12/21/22   I with final HEP Baseline:  Goal status: progressing with her and caregiver 08/19/22   2.  Improve TUG time to < 12 sec Baseline:  Goal status: met 08/14/22  3.  Improve 5 x STS to <12 Baseline:  Goal status:MET 06/05/22   4.  Increase BERG score to at least 44 Baseline:  Goal status: 45/56 MET   5.  Patient will ambulate with normalized gait pattern, minimizing abnormal weight shifts and increasing LLE position to more neutral hip rotation. Baseline:  Goal status: ongoing 08/28/22, progressing 10/02/22, ongoing 11/13/22  6. Patient will report no falls in a 4 week period             Baseline: 5 falls in 8 days 11/04/22             Goal status: met 12/04/22       PLAN:   PT FREQUENCY: 1-2x/week   PT DURATION: 10 weeks   PLANNED INTERVENTIONS: Therapeutic exercises, Therapeutic activity, Neuromuscular re-education, Balance training, Gait training, Patient/Family education, Self Care, Joint mobilization, Dry Needling, Cryotherapy, Moist heat, Ionotophoresis 4mg /ml Dexamethasone, and Manual therapy   PLAN FOR NEXT SESSION: work on hip ER and abduction as well as quad strength add more balance  4:14 PM,12/04/22 Stacie Glaze,  PT

## 2022-12-08 ENCOUNTER — Encounter: Payer: Self-pay | Admitting: Family Medicine

## 2022-12-08 ENCOUNTER — Ambulatory Visit (INDEPENDENT_AMBULATORY_CARE_PROVIDER_SITE_OTHER): Payer: BC Managed Care – PPO | Admitting: Family Medicine

## 2022-12-08 VITALS — BP 98/60 | HR 78 | Temp 98.5°F | Resp 18 | Ht 64.75 in | Wt 110.6 lb

## 2022-12-08 DIAGNOSIS — H6122 Impacted cerumen, left ear: Secondary | ICD-10-CM

## 2022-12-08 NOTE — Progress Notes (Signed)
Established Patient Office Visit  Subjective   Patient ID: Kerri Robertson, female    DOB: [birth year]  Age: 22 y.o. MRN: [id number]  Chief Complaint  Patient presents with   Ear Fullness    Left ear, pt states ear feels full, no pain, started Friday     HPI Discussed the use of AI scribe software for clinical note transcription with the patient, who gave verbal consent to proceed.  History of Present Illness   The patient presents with left ear discomfort that began on Friday while at work. She describes the sensation as a feeling of the ear being 'clogged.' The patient reports that the ear feels 'a little bit clogged' today, but she can hear out of it. She has not been using any drops in the ear to control the wax build-up.      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   Balance problem 05/06/2018   Syncope 05/06/2018   History of cerebrovascular accident 11/18/2017   Anxiety state 06/25/2017   Spasticity 06/25/2017   Allergic rhinitis 10/21/2016   Mild intermittent asthma 10/21/2016   Past Medical History:  Diagnosis Date   Allergy    Cerebral palsy ([name])    CP (cerebral palsy) ([name])    Stroke [facility])    Past Surgical History:  Procedure Laterality Date   ARM SURGERY     2013   FOOT SURGERY     2010   HAMSTRING LENGTHENING     TIBIA / FIBIA LENGTHENING     Social History   Tobacco Use   Smoking status: Never   Smokeless tobacco: Never  Vaping Use   Vaping status: Never Used  Substance Use Topics   Drug use: Never   Social History   Socioeconomic History   Marital status: Single    Spouse name: Not on  file   Number of children: Not on file   Years of education: Not on file   Highest education level: Not on file  Occupational History   Not on file  Tobacco Use   Smoking status: Never   Smokeless tobacco: Never  Vaping Use   Vaping status: Never Used  Substance and Sexual Activity   Alcohol use: Not on file   Drug use: Never   Sexual activity: Not on file  Other Topics Concern   Not on file  Social History Narrative   Leland is in the 12th grade at [employer]; she does well in school. She enjoys acting, tennis, bowling and singing. She lives with her mother and father.    Social Determinants of Health   Financial Resource Strain: Not on file  Food Insecurity: Not on file  Transportation Needs: Not on file  Physical Activity: Sufficiently Active (12/08/2022)   Exercise Vital Sign    Days of Exercise per Week: 4 days    Minutes of Exercise per Session: 40 min  Stress: No Stress Concern Present (12/08/2022)   [employer] of Occupational Health - Occupational Stress Questionnaire    Feeling of Stress : Only a little  Social Connections: Unknown (12/08/2022)   Social Connection and Isolation Panel [NHANES]  Frequency of Communication with Friends and Family: More than three times a week    Frequency of Social Gatherings with Friends and Family: More than three times a week    Attends Religious Services: Patient declined    Database administrator or Organizations: Yes    Attends Engineer, structural: More than 4 times per year    Marital Status: Never married  Intimate Partner Violence: Unknown (06/11/2022)   Received from [employer], [facility]   HITS    Physically Hurt: Not on file    Insult or Talk Down To: Not on file    Threaten Physical Harm: Not on file    Scream or Curse: Not on file   Family Status  Relation Name Status   Mother  Alive   Father  Alive   MGM  Alive   MGF  Alive   PGM  Deceased   PGF  Deceased   Other  (Not Specified)   Neg  Hx  (Not Specified)  No partnership data on file   Family History  Problem Relation Age of Onset   Hypertension Mother    Migraines Mother    Depression Mother    Anxiety disorder Mother    Hypertension Father    Depression Father    Anxiety disorder Father    Hypertension Maternal Grandmother    Hypertension Maternal Grandfather    Heart disease Other    Diabetes Other    Seizures Neg Hx    Bipolar disorder Neg Hx    Schizophrenia Neg Hx    ADD / ADHD Neg Hx    Autism Neg Hx    Allergies  Allergen Reactions   Benzodiazepines Nausea And Vomiting and Other (See Comments)    Sensitivity   Lactose Other (See Comments)   Latex Itching and Other (See Comments)      Review of Systems  Constitutional:  Negative for fever and malaise/fatigue.  HENT:  Positive for ear pain. Negative for congestion.   Eyes:  Negative for blurred vision.  Respiratory:  Negative for cough and shortness of breath.   Cardiovascular:  Negative for chest pain, palpitations and leg swelling.  Gastrointestinal:  Negative for vomiting.  Musculoskeletal:  Negative for back pain.  Skin:  Negative for rash.  Neurological:  Negative for loss of consciousness and headaches.      Objective:     BP 98/60 (BP Location: Right Arm, Patient Position: Sitting, Cuff Size: Normal)   Pulse 78   Temp 98.5 F (36.9 C) (Oral)   Resp 18   Ht 5' 4.75" (1.645 m)   Wt 110 lb 9.6 oz (50.2 kg)   SpO2 99%   BMI 18.55 kg/m  BP Readings from Last 3 Encounters:  12/08/22 98/60  12/01/22 122/77  10/06/22 110/69   Wt Readings from Last 3 Encounters:  12/08/22 110 lb 9.6 oz (50.2 kg)  12/01/22 109 lb (49.4 kg)  10/06/22 110 lb 9.6 oz (50.2 kg)   SpO2 Readings from Last 3 Encounters:  12/08/22 99%  12/01/22 97%  09/30/22 97%      Physical Exam Vitals and nursing note reviewed.  Constitutional:      General: She is not in acute distress.    Appearance: Normal appearance. She is well-developed.  HENT:      Head: Normocephalic and atraumatic.     Right Ear: Tympanic membrane, ear canal and external ear normal. There is no impacted cerumen.     Left Ear:  There is impacted cerumen.     Ears:     Comments: Unable to remove wax in L ear with hoop-- pt pt permission ear was irrigated with no complications On exam---- tm clear No evidence of infection  Eyes:     General: No scleral icterus.       Right eye: No discharge.        Left eye: No discharge.  Cardiovascular:     Rate and Rhythm: Normal rate and regular rhythm.     Heart sounds: No murmur heard. Pulmonary:     Effort: Pulmonary effort is normal. No respiratory distress.     Breath sounds: Normal breath sounds.  Musculoskeletal:        General: Normal range of motion.     Cervical back: Normal range of motion and neck supple.     Right lower leg: No edema.     Left lower leg: No edema.  Skin:    General: Skin is warm and dry.  Neurological:     Mental Status: She is alert and oriented to person, place, and time.  Psychiatric:        Mood and Affect: Mood normal.        Behavior: Behavior normal.        Thought Content: Thought content normal.        Judgment: Judgment normal.      No results found for any visits on 12/08/22.  Last CBC Lab Results  Component Value Date   WBC 5.3 03/17/2022   HGB 13.0 03/17/2022   HCT 38.8 03/17/2022   MCV 93.2 03/17/2022   MCH 30.4 06/09/2019   RDW 12.5 03/17/2022   PLT 205.0 03/17/2022   Last metabolic panel Lab Results  Component Value Date   GLUCOSE 118 (H) 05/31/2022   NA 136 05/31/2022   K 3.9 05/31/2022   CL 102 05/31/2022   CO2 25 05/31/2022   BUN 15 05/31/2022   CREATININE 0.52 05/31/2022   GFRNONAA >60 05/31/2022   CALCIUM 9.6 05/31/2022   PROT 7.7 06/09/2019   ALBUMIN 4.5 06/09/2019   BILITOT 0.8 06/09/2019   ALKPHOS 73 06/09/2019   AST 15 06/09/2019   ALT 19 06/09/2019   ANIONGAP 9 05/31/2022   Last lipids No results found for: "CHOL", "HDL", "LDLCALC",  "LDLDIRECT", "TRIG", "CHOLHDL" Last hemoglobin A1c No results found for: "HGBA1C" Last thyroid functions Lab Results  Component Value Date   TSH 0.603 06/04/2022   Last vitamin D Lab Results  Component Value Date   VD25OH 19.00 (L) 03/17/2022   Last vitamin B12 and Folate No results found for: "VITAMINB12", "FOLATE"    The ASCVD Risk score (Arnett DK, et al., 2019) failed to calculate for the following reasons:   The 2019 ASCVD risk score is only valid for ages 25 to 88   The patient has a prior MI or stroke diagnosis    Assessment & Plan:  Assessment and Plan    Cerumen Impaction Left ear clogged with wax, no signs of infection. Eardrum visible. -Nurse to perform ear irrigation. -Recommended use of over-the-counter Debrox drops to soften wax and facilitate future cleanings.      t Problem List Items Addressed This Visit   None Visit Diagnoses     Impacted cerumen of left ear    -  Primary       No follow-ups on file.    Donato Schultz, DO

## 2022-12-15 NOTE — Therapy (Unsigned)
OUTPATIENT OCCUPATIONAL THERAPY NEURO EVALUATION  Patient Name: Kerri Robertson MRN: [id number] DOB:07-01-2000, 22 y.o., female Today's Date: 12/17/2022  PCP: none REFERRING PROVIDER: Dr. Reola Calkins  END OF SESSION:  OT End of Session - 12/17/22 1632     Visit Number 1    Number of Visits 25    Date for OT Re-Evaluation 03/11/23    Authorization Type BCBS/Medicaid    Authorization Time Period 12 weeks    OT Start Time 1535    OT Stop Time 1615    OT Time Calculation (min) 40 min             Past Medical History:  Diagnosis Date   Allergy    Cerebral palsy ([name])    CP (cerebral palsy) ([name])    Stroke [facility])    Past Surgical History:  Procedure Laterality Date   ARM SURGERY     2013   FOOT SURGERY     2010   HAMSTRING LENGTHENING     TIBIA / FIBIA LENGTHENING     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   Balance problem 05/06/2018   Syncope 05/06/2018   History of cerebrovascular accident 11/18/2017   Anxiety state 06/25/2017   Spasticity 06/25/2017   Allergic rhinitis 10/21/2016   Mild intermittent asthma 10/21/2016    ONSET DATE: 12/01/22  REFERRING DIAG: G80.2 (ICD-10-CM) - Spastic hemiplegic cerebral palsy ([name])  THERAPY DIAG:  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) - Plan: Ot plan of care cert/re-cert  Muscle weakness (generalized) - Plan: Ot plan of care cert/re-cert  Unsteadiness on feet - Plan: Ot plan of care cert/re-cert  Other abnormalities of gait and mobility - Plan: Ot plan of care cert/re-cert  Other lack of  coordination - Plan: Ot plan of care cert/re-cert  Rationale for Evaluation and Treatment: Rehabilitation  SUBJECTIVE:   SUBJECTIVE STATEMENT: Pt reports difficulty using her LUE for ADLs. 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 QIO:NGEXBMW of multiple surgeries, particularly on the upper and lower extremities, generalized weakness, more pronounced on the left side, and a contracture in the left arm   PRECAUTIONS: Fall  WEIGHT BEARING RESTRICTIONS: No  PAIN:  Are you having pain? No  FALLS: Has patient fallen in last 6 months? Yes.   LIVING ENVIRONMENT: Lives with: lives with their family Lives in: House/apartment   PLOF: Needs assistance with ADLs and Needs assistance with homemaking  PATIENT GOALS: increase LUE functional use  OBJECTIVE:   HAND DOMINANCE: Right  ADLs:Pt reports difficulty with the following tasks; button jeans, donn/ doff earrings, open water bottles Pt/ mom report she uses LUE only 5% of the time. Overall ADLs: increased time required Transfers/ambulation related to ADLs:mod I with hx of falls Eating: needs assist with cutting food , difficulty with spreading cream cheese Grooming: needs assist with brushing and putty hair in a pony tail difficulty opening mascara and lip gloss UB Dressing: mod I  LB Dressing: needs assist  with tying and getting shoes on Toileting: mod I Bathing: needs assist with washing hair, difficulty washing R  side Tub Shower transfers:  has sliding tub bench , supervision Equipment: Transfer tub bench  IADLs: Shopping: need assist for  Light housekeeping: does laundry , cleans bathroom, dusts  Meal Prep: pt makes mac and cheese, garlic  bread in frying pan  however pt needs tortilla bag Community mobility: mod I Medication management: needs reminders, pt sorts  R handed   MOBILITY STATUS: Independent    UPPER EXTREMITY ROM:     Active ROM Right eval Left eval  Shoulder flexion  105  Shoulder abduction    Shoulder adduction    Shoulder extension    Shoulder internal rotation    Shoulder external rotation    Elbow flexion  125  Elbow extension  -35  Wrist flexion  -65 at rest  Wrist extension  -65/-15 P/ROM  Wrist ulnar deviation    Wrist radial deviation    Wrist pronation    Wrist supination  Unable/ 75% P/ROM  (Blank rows = not tested)  HAND FUNCTION: Grip strength: Right: NT lbs; Left: NT lbs  COORDINATION: LUE is impaired pt was able to pick up a block 1/3 trials to place in container  SENSATION: Not tested    MUSCLE TONE: LUE: Mild and Hypertonic  COGNITION: Overall cognitive status:  decreased short term memory per PT report    VISION ASSESSMENT: Not tested     OBSERVATIONS: Pt is motivated  and pleasant .   TODAY'S TREATMENT:                                                                                                                              DATE: 12/17/22- eval only   PATIENT EDUCATION: Education details: role of OT, potential goals Person educated: Patient and Parent Education method: Explanation Education comprehension: verbalized understanding  HOME EXERCISE PROGRAM: N/a   GOALS: Goals reviewed with patient? Yes  SHORT TERM GOALS: Target date: 01/16/23  I with initial HEP.  Goal status: INITIAL  2.  Pt will report using LUE as a gross assist at least 10% of the time for ADLs.   Goal status: INITIAL  3.  Pt will demonstrate ability to pick up a 1 inch block with LUE 2/5 trials consistently.  Goal status: INITIAL  4.  I with positioning to minimize contracture including splinting prn Baseline:  Goal status: INITIAL  5.  Pt will increase LUE A/ROM shoulder flexion to 110* for increased ease with ADLs. Baseline:  Goal status: INITIAL  6.    LONG TERM GOALS: Target date: 03/11/23  I with updated HEP.  Goal status: INITIAL  2.  Pt will  report increased ease with fastening jeans, opening water bottles and donning/ doffing earrings.  Goal status: INITIAL  3.  Pt will use LUE to assist with brushing her hair. Baseline:  Goal status: INITIAL  4.   Pt will report using LUE as a gross / active assist at least 15% of the time for ADLs.  Goal status: INITIAL  5.  Pt will demonstrate -30 elbow extension in prep for functional reach.  Goal status: INITIAL  6. Assess for vivistim prn Baseline: inital    ASSESSMENT:  CLINICAL IMPRESSION: Patient is a 22 y.o. female who was seen today for occupational therapy evaluation for G80.2 (ICD-10-CM) - Spastic hemiplegic cerebral palsy ([name]). Pt presents with deficits which impede performance of ADLs/IADLS. Pt can benefit from skilled OT to address these deficits.  PERFORMANCE DEFICITS: in functional skills including ADLs, IADLs, coordination, dexterity, proprioception, sensation, tone, ROM, strength, flexibility, Fine motor control, Gross motor control, mobility, balance, endurance, decreased knowledge of precautions, decreased knowledge of use of DME, vision, and UE functional use, cognitive skills including memory and thought, and psychosocial skills including coping strategies, environmental adaptation, habits, interpersonal interactions, and routines and behaviors.   IMPAIRMENTS: are limiting patient from ADLs, IADLs, work, play, leisure, and social participation.   CO-MORBIDITIES: may have co-morbidities  that affects occupational performance. Patient will benefit from skilled OT to address above impairments and improve overall function.  MODIFICATION OR ASSISTANCE TO COMPLETE EVALUATION: No modification of tasks or assist necessary to complete an evaluation.  OT OCCUPATIONAL PROFILE AND HISTORY: Detailed assessment: Review of records and additional review of physical, cognitive, psychosocial history related to current functional performance.  CLINICAL DECISION MAKING: LOW -  limited treatment options, no task modification necessary  REHAB POTENTIAL: Good  EVALUATION COMPLEXITY: Low    PLAN:  OT FREQUENCY: 2x/week plus eval  OT DURATION: 12 weeks  PLANNED INTERVENTIONS: self care/ADL training, therapeutic exercise, therapeutic activity, neuromuscular re-education, manual therapy, passive range of motion, functional mobility training, splinting, electrical stimulation, ultrasound, paraffin, moist heat, cryotherapy, contrast bath, patient/family education, cognitive remediation/compensation, visual/perceptual remediation/compensation, energy conservation, coping strategies training, DME and/or AE instructions, and Re-evaluation  RECOMMENDED OTHER SERVICES: PT  CONSULTED AND AGREED WITH PLAN OF CARE: Patient and family member/caregiver  PLAN FOR NEXT SESSION: initial HEP   Samiyyah Moffa, OT 12/17/2022, 5:03 PM

## 2022-12-16 ENCOUNTER — Encounter: Payer: Self-pay | Admitting: Physical Therapy

## 2022-12-16 ENCOUNTER — Ambulatory Visit: Payer: BC Managed Care – PPO | Admitting: Physical Therapy

## 2022-12-16 DIAGNOSIS — M6281 Muscle weakness (generalized): Secondary | ICD-10-CM | POA: Diagnosis not present

## 2022-12-16 DIAGNOSIS — G801 Spastic diplegic cerebral palsy: Secondary | ICD-10-CM

## 2022-12-16 DIAGNOSIS — I69954 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: Secondary | ICD-10-CM

## 2022-12-16 DIAGNOSIS — G800 Spastic quadriplegic cerebral palsy: Secondary | ICD-10-CM

## 2022-12-16 DIAGNOSIS — R531 Weakness: Secondary | ICD-10-CM

## 2022-12-16 DIAGNOSIS — R2681 Unsteadiness on feet: Secondary | ICD-10-CM

## 2022-12-16 DIAGNOSIS — R262 Difficulty in walking, not elsewhere classified: Secondary | ICD-10-CM

## 2022-12-16 NOTE — Therapy (Signed)
OUTPATIENT PHYSICAL THERAPY TREATMENT   Patient Name: Kerri Robertson MRN: [id number] DOB:02-20-2001, 22 y.o., female Today's Date: 04/10/2022   END OF SESSION:  PT End of Session - 12/16/22 1703     Visit Number 45    Date for PT Re-Evaluation 12/21/22    PT Start Time 1655    PT Stop Time 1740    PT Time Calculation (min) 45 min    Activity Tolerance Patient tolerated treatment well    Behavior During Therapy WFL for tasks assessed/performed                      Past Medical History:  Diagnosis Date   Allergy     Cerebral palsy ([name])     Stroke ([name])           Past Surgical History:  Procedure Laterality Date   ARM SURGERY        2013   FOOT SURGERY        2010   HAMSTRING LENGTHENING       TIBIA / FIBIA LENGTHENING            Patient Active Problem List    Diagnosis Date Noted   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   Balance problem 05/06/2018   Syncope 05/06/2018   History of cerebrovascular accident 11/18/2017   CP (cerebral palsy), spastic, 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[name]) 06/25/2017   Anxiety state 06/25/2017   Spasticity 06/25/2017   Allergic rhinitis 10/21/2016   Mild intermittent asthma 10/21/2016      PCP: Clayborne Dana, NP   REFERRING PROVIDER: Clayborne Dana, NP   REFERRING DIAG: G80.0 (ICD-10-CM) - CP (cerebral palsy), spastic, 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[name])    THERAPY DIAG:  Spastic quadriplegic cerebral palsy ([name])   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)   Other lack of coordination   Unsteadiness on feet   Difficulty in walking, not elsewhere classified   Left-sided weakness   Muscle weakness (generalized)   Abnormal posture   Cerebral palsy with spastic diplegia ([name])   Rationale for Evaluation and Treatment: Rehabilitation   ONSET DATE: 03/17/22   SUBJECTIVE:    SUBJECTIVE STATEMENT: Went to [place] last  week, mom reports a fall, no injury, she reports that it is when [facility] turns quickly and starts to walk, describes a pivot type turn  PERTINENT HISTORY:     CP (cerebral palsy), spastic, 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[name]) - Primary         She is stable, at baseline (ambulatory, but with with some gait abnormalities, balance issues, and weakness). Rechecking CBC, iron, and vitamin D today. She has been on supplementation (refilling iron for her). Will make additional changes based on labs if indicated. PT referral placed to [facility] at her request. No acute concerns today.      PAIN:  Are you having pain? No   PRECAUTIONS: None   WEIGHT BEARING RESTRICTIONS: No   FALLS:  Has patient fallen in last 6 months? Yes. 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 Hobbies- Runner, broadcasting/film/video, archery, baseball, tennis, dance    PLOF: Needs assistance with ADLs   PATIENT GOALS: Patient would like to improve her flexibility and balance.   NEXT MD VISIT:  OBJECTIVE:    DIAGNOSTIC FINDINGS:  Radiology consult confirms lumbar scoliosis with apex at L2-3, but since patient is asymptomatic, recommend monitoring it and continue to strengthen.           COGNITION: Overall cognitive status: Within functional limits for tasks assessed                         SENSATION: Not tested     MUSCLE LENGTH: Hamstrings: Right 40 deg; Left 30 deg     POSTURE: left pelvic obliquity, weight shift right, and hips rotated to L with L LE held in IR, increased R WB     LOWER EXTREMITY ROM: Trunk lateraly flexed due to scoliosis, L hip unable to achieve neutral ER, abd limited, B hamstrings tight, L ankle does not achieve neutral DF or inversion. LUE with limited ROM at all joints, in all planes. she wears a splint on her wrist at  night.      LOWER EXTREMITY MMT: RLE 4/5, LLE with fluctuating tone throughout, HS very weak, hips also weak-MMT accuracy questionable due to her tone.   FUNCTIONAL TESTS:  5 times sit to stand: 14.77,  07/03/22 13 seconds, 09/30/22 = 12 seconds Timed up and go (TUG): 13.75 , 07/03/22 = 10 seconds, 09/30/22 9 seconds Berg Balance Scale: 48/56  10/21/22 = 49/56   GAIT: Distance walked: clinic distances Assistive device utilized: None Level of assistance: Modified independence Comments: Step-to pattern;Decreased step length - left;Decreased stance time - left;Decreased weight shift to left;Shuffle;Ataxic;Lateral hip instability;Trunk rotated posteriorly on left      TODAY'S TREATMENT:                                                                                                              DATE:  12/16/22 Bike level 4 x 7 minutes Step turn touch In pbars on bosu balance and then marching On rockerboard 2ways Step turn hit the ball with two people on each side back and forth, cues needed to pick up feet and to turn right and then left 40# leg press , then left only 20# with help for knee not to go into valgus Supine with slide board left hip abduction Bridges Side lying eccentric left leg hip clams Passive stretch to the left hip and eg  12/04/22 Nustep level 5 x 6 minutes 10# right arm row 5# right arm extension 5# AR press  Ball toss and hits, with some stepping Ball kicks Leg press 50# both 2x10, left only 30# 2x10 Side stepping Walking ball taps On bosu balance PROM left LE Yellow tband hip extension and abduction on the left in supine  12/02/22 Nustep level 6 x 6 minutes On airex 5# arm extension and row right side Side step on and off airex Seated and standing volleyball Leg press 40# both legs 2x10, 20# left only 2x10 Right arm 8# farmer carry, right arm 4# overhead carry On bosu balance and rockerboard in Pbars Side stepping over bars, walking over  bars  Bridges Crunches PROM stretches of the left LE  11/27/22 NuStep L5 x79mins  STS on airex 2x10 SBA  Step ups 6"  Stairs step over step up and down Side and forward steps over foam beams Standing hip ext 2# 2x10 Standing at side of stairs heel taps with 2# 2x10 Left leg HS curls 10# 2x10 Left leg only extension 5# 2x10   11/25/22 Nustep level 5 x 6 minutes Standing balance volleyball and then on the airex Leg press 20# x10, 40# x10, 60# x10, then 40# at a lower spot for increase ROM, then left leg 20# 2x10 Left leg HS curls 10# 2x10 Left leg only extension 5# 2x10 5# on airex row and extension with SBA Side step on and off airex Stairs step over step up and down [employer] tband hip extension Green tband clamshells Sidelying clamshells Passive left LE stretches crunches  11/13/22 NuStep L5 x74mins  Supine bridges and then bridges with PT holding the left leg up 2x10  Passive stretch to the left hip Feet on ball K2C, rotations, bridges x10  On airex cone toe touches in II bars  Step ups 2x10 leading with each side  STS x10, then x10 with ball hit   11/04/22 NuStep L5 x3mins  Lunges on steps 2x10 Leg press 20# 2x10, LLE only x5 STS on airex, CGA falls back into heels a little bit  Leg ext 5# BLE 2x10, 5# LLE x5 Ball kicks x10 each side  Side steps on beam    10/30/22 NuStep L5 x43mins  Leg ext 5# ankle weight 2x10 HS curls green 2x10 Seated ER with red band x10 each side, very minimal activation Standing on airex marching  Heel raises on airex  Side stepping over obstacles, minA  On airex reaching different directions, CGA  On airex volleyball hits   10/28/22 Nustep level 5 x 6 minutes Side step on and off airex On bosu balancing 5# marches 2x10 5# hip abduction 2x10 Direction changes Volleyball Supine bridges and then bridges with PT holding the left leg up Crunches Passive stretch to the left hip Leg press 20# 2x10 each leg Leg extension 5# left  x 10, then both x10 Cone toe touches Ball kicks  10/23/22 NuStep L5 x8mins  Hip bridges 2x10  Prone hip ext RLE 2x10  Passive stretching oh LE  Lunges on BOSU 20 reps alt in II bars  Standing on BOSU rocking back and forth and side to side  Volleyball hits with steps  5# AW marching 20 reps alt  LAQ 5# 2x10  Side steps with 5# on [facility]    10/21/22 Nustep level 5 x 6 minutes Berg 49/56 TUG 10 seconds 6" alternating toe touches Right foot propped up on the 6" step and trying to balance on the left needing min-mod A Side stepping, fwd and backward walking Leg press 40# both legs, left only 20# and then 30# On airex ball taps back and forth Standing on bosu upside down in pbars with Min A Supine passive left hip stretches Side lying clams and hip abduction with assist Rocker board two ways in pbars Gait outside curbs, slopes grass Bridge and then left leg bridge PATIENT EDUCATION:  Education details: impact spasticity and inflexibility has on gait and patella tracking during ambulation; encouraged increase in stretch; consider botox or other appropriate intervention with MD regarding spasticity (if needed); updates to HEP Person educated: Patient and Caregiver   Education method: Explanation Education comprehension: verbalized understanding  HOME EXERCISE PROGRAM: [email or web address]  Access Code: [email or web address] URL: [email or web address] Date: 07/17/2022 Prepared by: Stacie Glaze  Exercises - Supine Hip Internal and External Rotation  - 1 x daily - 7 x weekly - 2 sets - 10 reps - 3 hold - Supine Hip Abduction on Slider  - 1 x daily - 7 x weekly - 2 sets - 10 reps - 3 hold - Hooklying Clamshell with Resistance  - 1 x daily - 7 x weekly - 2 sets - 10 reps - 3 hold - Hip Abduction and Adduction Caregiver PROM  - 1 x daily - 7 x weekly - 1 sets - 5 reps - 30-60 hold - Caregiver PROM Hip Internal External Rotation  - 1 x daily - 7 x weekly - 1 sets - 5 reps - 30-60 hold -  Supine Short Arc Quad  - 1 x daily - 7 x weekly - 2 sets - 10 reps - 3 hold ASSESSMENT:   CLINICAL IMPRESSION: Patient had a fall last week while out of town on vacation.  REports that she was turning to walk and got her feet tangled up.  We worked on step turns, she did well but needed a lot of cues to do correctly, had good active ROM of the left hip abduction in supine with sliding board  OBJECTIVE IMPAIRMENTS: Abnormal gait, decreased balance, decreased coordination, decreased endurance, decreased mobility, difficulty walking, decreased ROM, decreased strength, increased fascial restrictions, increased muscle spasms, impaired flexibility, impaired tone, improper body mechanics, and postural dysfunction.    ACTIVITY LIMITATIONS: standing, squatting, stairs, transfers, and locomotion level   PARTICIPATION LIMITATIONS: shopping, community activity, and occupation   PERSONAL FACTORS: 1 comorbidity: CP  are also affecting patient's functional outcome.    REHAB POTENTIAL: Good   CLINICAL DECISION MAKING: Stable/uncomplicated   EVALUATION COMPLEXITY: Moderate     GOALS: Goals reviewed with patient? Yes   SHORT TERM GOALS: Target date: 05/01/22 I with initial HEP Baseline: Goal status: MET   LONG TERM GOALS: Target date: 12/21/22   I with final HEP Baseline:  Goal status: progressing with her and caregiver 08/19/22   2.  Improve TUG time to < 12 sec Baseline:  Goal status: met 08/14/22  3.  Improve 5 x STS to <12 Baseline:  Goal status:MET 06/05/22   4.  Increase BERG score to at least 44 Baseline:  Goal status: 45/56 MET   5.  Patient will ambulate with normalized gait pattern, minimizing abnormal weight shifts and increasing LLE position to more neutral hip rotation. Baseline:  Goal status: ongoing 08/28/22, progressing 10/02/22, ongoing 11/13/22  6. Patient will report no falls in a 4 week period             Baseline: 5 falls in 8 days 11/04/22             Goal status: met  12/04/22       PLAN:   PT FREQUENCY: 1-2x/week   PT DURATION: 10 weeks   PLANNED INTERVENTIONS: Therapeutic exercises, Therapeutic activity, Neuromuscular re-education, Balance training, Gait training, Patient/Family education, Self Care, Joint mobilization, Dry Needling, Cryotherapy, Moist heat, Ionotophoresis 4mg /ml Dexamethasone, and Manual therapy   PLAN FOR NEXT SESSION: work on hip ER and abduction as well as quad strength add more balance  5:04 PM,12/16/22 Stacie Glaze,  PT

## 2022-12-17 ENCOUNTER — Ambulatory Visit: Payer: BC Managed Care – PPO | Admitting: Occupational Therapy

## 2022-12-17 DIAGNOSIS — I69954 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: Secondary | ICD-10-CM

## 2022-12-17 DIAGNOSIS — M6281 Muscle weakness (generalized): Secondary | ICD-10-CM

## 2022-12-17 DIAGNOSIS — R2689 Other abnormalities of gait and mobility: Secondary | ICD-10-CM

## 2022-12-17 DIAGNOSIS — R2681 Unsteadiness on feet: Secondary | ICD-10-CM

## 2022-12-17 DIAGNOSIS — R278 Other lack of coordination: Secondary | ICD-10-CM

## 2022-12-18 ENCOUNTER — Ambulatory Visit: Payer: BC Managed Care – PPO | Admitting: Physical Therapy

## 2022-12-18 ENCOUNTER — Encounter: Payer: Self-pay | Admitting: Physical Therapy

## 2022-12-18 DIAGNOSIS — R2681 Unsteadiness on feet: Secondary | ICD-10-CM

## 2022-12-18 DIAGNOSIS — M6281 Muscle weakness (generalized): Secondary | ICD-10-CM | POA: Diagnosis not present

## 2022-12-18 DIAGNOSIS — I69954 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: Secondary | ICD-10-CM

## 2022-12-18 DIAGNOSIS — G801 Spastic diplegic cerebral palsy: Secondary | ICD-10-CM

## 2022-12-18 DIAGNOSIS — R262 Difficulty in walking, not elsewhere classified: Secondary | ICD-10-CM

## 2022-12-18 NOTE — Therapy (Signed)
OUTPATIENT PHYSICAL THERAPY TREATMENT   Patient Name: Kerri Robertson MRN: [id number] DOB:October 16, 2000, 22 y.o., female Today's Date: 04/10/2022   END OF SESSION:  PT End of Session - 12/18/22 1700     Visit Number 46    Date for PT Re-Evaluation 12/21/22    PT Start Time 1658    PT Stop Time 1745    PT Time Calculation (min) 47 min    Activity Tolerance Patient tolerated treatment well    Behavior During Therapy WFL for tasks assessed/performed                      Past Medical History:  Diagnosis Date   Allergy     Cerebral palsy ([name])     Stroke ([name])           Past Surgical History:  Procedure Laterality Date   ARM SURGERY        2013   FOOT SURGERY        2010   HAMSTRING LENGTHENING       TIBIA / FIBIA LENGTHENING            Patient Active Problem List    Diagnosis Date Noted   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   Balance problem 05/06/2018   Syncope 05/06/2018   History of cerebrovascular accident 11/18/2017   CP (cerebral palsy), spastic, 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[name]) 06/25/2017   Anxiety state 06/25/2017   Spasticity 06/25/2017   Allergic rhinitis 10/21/2016   Mild intermittent asthma 10/21/2016      PCP: Clayborne Dana, NP   REFERRING PROVIDER: Clayborne Dana, NP   REFERRING DIAG: G80.0 (ICD-10-CM) - CP (cerebral palsy), spastic, 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[name])    THERAPY DIAG:  Spastic quadriplegic cerebral palsy ([name])   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 Rechecking CBC, iron, and vitamin D today. She has been on supplementation (refilling iron for her). Will make additional changes based on labs if indicated. PT referral placed to [facility] at her request. No acute concerns today.      PAIN:  Are you having pain? No   PRECAUTIONS: None   WEIGHT BEARING RESTRICTIONS: No   FALLS:  Has patient fallen in last 6 months? Yes. 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 Hobbies- Runner, broadcasting/film/video, archery, baseball, tennis, dance    PLOF: Needs assistance with ADLs   PATIENT GOALS: Patient would like to improve her flexibility and balance.   NEXT MD VISIT:    OBJECTIVE:    DIAGNOSTIC FINDINGS:  Radiology consult confirms lumbar scoliosis with apex at L2-3, but since patient is asymptomatic,  recommend monitoring it and continue to strengthen.           COGNITION: Overall cognitive status: Within functional limits for tasks assessed                         SENSATION: Not tested     MUSCLE LENGTH: Hamstrings: Right 40 deg; Left 30 deg     POSTURE: left pelvic obliquity, weight shift right, and hips rotated to L with L LE held in IR, increased R WB     LOWER EXTREMITY ROM: Trunk lateraly flexed due to scoliosis, L hip unable to achieve neutral ER, abd limited, B hamstrings tight, L ankle does not achieve neutral DF or inversion. LUE with limited ROM at all joints, in all planes. she wears a splint on her wrist at night.      LOWER EXTREMITY MMT: RLE 4/5, LLE with fluctuating tone throughout, HS very weak, hips also  weak-MMT accuracy questionable due to her tone.   FUNCTIONAL TESTS:  5 times sit to stand: 14.77,  07/03/22 13 seconds, 09/30/22 = 12 seconds Timed up and go (TUG): 13.75 , 07/03/22 = 10 seconds, 09/30/22 9 seconds Berg Balance Scale: 48/56  10/21/22 = 49/56   GAIT: Distance walked: clinic distances Assistive device utilized: None Level of assistance: Modified independence Comments: Step-to pattern;Decreased step length - left;Decreased stance time - left;Decreased weight shift to left;Shuffle;Ataxic;Lateral hip instability;Trunk rotated posteriorly on left      TODAY'S TREATMENT:                                                                                                              DATE:  12/18/22 Nustep level 5 x 6 minutes Step turn hit with person on each side of her 5# leg curls left only  Leg extension 5# left eccentrics On airex corn hole CGA Right foot up on 6" step corn hole very difficult Left foot on 6" box corn hole On airex 10# pulls and 5# pulls Passive right LE stretches Foot on sliding board in supine hip abduction Walking ball hits  12/16/22 Bike level 4 x 7 minutes Step turn touch In pbars on bosu balance and then marching On rockerboard 2ways Step turn hit the ball with two people on each side back and forth, cues needed to pick up feet and to turn right and then left 40# leg press , then left only 20# with help for knee not to go into valgus Supine with slide board left hip abduction Bridges Side lying eccentric left leg hip clams Passive stretch to the left hip and eg  12/04/22 Nustep level 5 x 6 minutes 10# right arm row 5# right arm extension 5# AR press  Ball toss and hits, with some stepping Ball kicks Leg press 50# both 2x10, left only 30# 2x10 Side stepping Walking ball taps On bosu balance PROM left LE Yellow tband hip extension and abduction on the left in supine  12/02/22 Nustep level 6 x 6 minutes On airex  5# arm extension and row  right side Side step on and off airex Seated and standing volleyball Leg press 40# both legs 2x10, 20# left only 2x10 Right arm 8# farmer carry, right arm 4# overhead carry On bosu balance and rockerboard in Pbars Side stepping over bars, walking over bars Bridges Crunches PROM stretches of the left LE  11/27/22 NuStep L5 x30mins  STS on airex 2x10 SBA  Step ups 6"  Stairs step over step up and down Side and forward steps over foam beams Standing hip ext 2# 2x10 Standing at side of stairs heel taps with 2# 2x10 Left leg HS curls 10# 2x10 Left leg only extension 5# 2x10   11/25/22 Nustep level 5 x 6 minutes Standing balance volleyball and then on the airex Leg press 20# x10, 40# x10, 60# x10, then 40# at a lower spot for increase ROM, then left leg 20# 2x10 Left leg HS curls 10# 2x10 Left leg only extension 5# 2x10 5# on airex row and extension with SBA Side step on and off airex Stairs step over step up and down [employer] tband hip extension Green tband clamshells Sidelying clamshells Passive left LE stretches crunches  11/13/22 NuStep L5 x42mins  Supine bridges and then bridges with PT holding the left leg up 2x10  Passive stretch to the left hip Feet on ball K2C, rotations, bridges x10  On airex cone toe touches in II bars  Step ups 2x10 leading with each side  STS x10, then x10 with ball hit  PATIENT EDUCATION:  Education details: impact spasticity and inflexibility has on gait and patella tracking during ambulation; encouraged increase in stretch; consider botox or other appropriate intervention with MD regarding spasticity (if needed); updates to HEP Person educated: Patient and Caregiver   Education method: Explanation Education comprehension: verbalized understanding   HOME EXERCISE PROGRAM: [email or web address]  Access Code: [email or web address] URL: [email or web address] Date: 07/17/2022 Prepared by: Stacie Glaze  Exercises - Supine Hip Internal and  External Rotation  - 1 x daily - 7 x weekly - 2 sets - 10 reps - 3 hold - Supine Hip Abduction on Slider  - 1 x daily - 7 x weekly - 2 sets - 10 reps - 3 hold - Hooklying Clamshell with Resistance  - 1 x daily - 7 x weekly - 2 sets - 10 reps - 3 hold - Hip Abduction and Adduction Caregiver PROM  - 1 x daily - 7 x weekly - 1 sets - 5 reps - 30-60 hold - Caregiver PROM Hip Internal External Rotation  - 1 x daily - 7 x weekly - 1 sets - 5 reps - 30-60 hold - Supine Short Arc Quad  - 1 x daily - 7 x weekly - 2 sets - 10 reps - 3 hold ASSESSMENT:   CLINICAL IMPRESSION: Patient tried some different higher level balance, with tossing the bean bags on a double airex needed CGA, with the right foot propped up on box tossing bean bags needed min A, able to do with the left leg up with SBA.  The left hip abduction on the sliding board seems to be getting better and with less body compensation  OBJECTIVE IMPAIRMENTS: Abnormal gait, decreased balance, decreased coordination, decreased endurance, decreased mobility, difficulty walking, decreased ROM, decreased strength, increased fascial restrictions, increased muscle spasms, impaired flexibility, impaired tone, improper body mechanics, and postural dysfunction.    ACTIVITY LIMITATIONS: standing, squatting, stairs, transfers, and locomotion level   PARTICIPATION LIMITATIONS: shopping, community activity, and  occupation   PERSONAL FACTORS: 1 comorbidity: CP  are also affecting patient's functional outcome.    REHAB POTENTIAL: Good   CLINICAL DECISION MAKING: Stable/uncomplicated   EVALUATION COMPLEXITY: Moderate     GOALS: Goals reviewed with patient? Yes   SHORT TERM GOALS: Target date: 05/01/22 I with initial HEP Baseline: Goal status: MET   LONG TERM GOALS: Target date: 12/21/22   I with final HEP Baseline:  Goal status: progressing with her and caregiver 08/19/22   2.  Improve TUG time to < 12 sec Baseline:  Goal status: met  08/14/22  3.  Improve 5 x STS to <12 Baseline:  Goal status:MET 06/05/22   4.  Increase BERG score to at least 44 Baseline:  Goal status: 45/56 MET   5.  Patient will ambulate with normalized gait pattern, minimizing abnormal weight shifts and increasing LLE position to more neutral hip rotation. Baseline:  Goal status: ongoing 08/28/22, progressing 10/02/22, ongoing 11/13/22  6. Patient will report no falls in a 4 week period             Baseline: 5 falls in 8 days 11/04/22             Goal status: had a fall 2 weeks ago 12/18/22     PLAN:   PT FREQUENCY: 1-2x/week   PT DURATION: 10 weeks   PLANNED INTERVENTIONS: Therapeutic exercises, Therapeutic activity, Neuromuscular re-education, Balance training, Gait training, Patient/Family education, Self Care, Joint mobilization, Dry Needling, Cryotherapy, Moist heat, Ionotophoresis 4mg /ml Dexamethasone, and Manual therapy   PLAN FOR NEXT SESSION: work on hip ER and abduction as well as quad strength add more balance  5:01 PM,12/18/22 Stacie Glaze,  PT

## 2022-12-23 ENCOUNTER — Ambulatory Visit: Payer: BC Managed Care – PPO | Admitting: Physical Therapy

## 2022-12-23 ENCOUNTER — Encounter: Payer: Self-pay | Admitting: Physical Therapy

## 2022-12-23 ENCOUNTER — Ambulatory Visit: Payer: BC Managed Care – PPO | Admitting: Occupational Therapy

## 2022-12-23 DIAGNOSIS — R531 Weakness: Secondary | ICD-10-CM

## 2022-12-23 DIAGNOSIS — M6281 Muscle weakness (generalized): Secondary | ICD-10-CM | POA: Diagnosis not present

## 2022-12-23 DIAGNOSIS — G801 Spastic diplegic cerebral palsy: Secondary | ICD-10-CM

## 2022-12-23 DIAGNOSIS — R262 Difficulty in walking, not elsewhere classified: Secondary | ICD-10-CM

## 2022-12-23 DIAGNOSIS — G800 Spastic quadriplegic cerebral palsy: Secondary | ICD-10-CM

## 2022-12-23 DIAGNOSIS — I69954 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: Secondary | ICD-10-CM

## 2022-12-23 DIAGNOSIS — R2681 Unsteadiness on feet: Secondary | ICD-10-CM

## 2022-12-23 DIAGNOSIS — R2689 Other abnormalities of gait and mobility: Secondary | ICD-10-CM

## 2022-12-23 NOTE — Therapy (Signed)
OUTPATIENT PHYSICAL THERAPY TREATMENT   Patient Name: Kerri Robertson MRN: [id number] DOB:March 06, 2001, 22 y.o., female Today's Date: 04/10/2022   END OF SESSION:  PT End of Session - 12/23/22 1702     Visit Number 47    Date for PT Re-Evaluation 02/22/23    Authorization Type [employer]    PT Start Time 1658    PT Stop Time 1742    PT Time Calculation (min) 44 min    Activity Tolerance Patient tolerated treatment well    Behavior During Therapy WFL for tasks assessed/performed                      Past Medical History:  Diagnosis Date   Allergy     Cerebral palsy ([name])     Stroke ([name])           Past Surgical History:  Procedure Laterality Date   ARM SURGERY        2013   FOOT SURGERY        2010   HAMSTRING LENGTHENING       TIBIA / FIBIA LENGTHENING            Patient Active Problem List    Diagnosis Date Noted   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   Balance problem 05/06/2018   Syncope 05/06/2018   History of cerebrovascular accident 11/18/2017   CP (cerebral palsy), spastic, 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[name]) 06/25/2017   Anxiety state 06/25/2017   Spasticity 06/25/2017   Allergic rhinitis 10/21/2016   Mild intermittent asthma 10/21/2016      PCP: Clayborne Dana, NP   REFERRING PROVIDER: Clayborne Dana, NP   REFERRING DIAG: G80.0 (ICD-10-CM) - CP (cerebral palsy), spastic, 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[name])    THERAPY DIAG:  Spastic quadriplegic cerebral palsy ([name])   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  REports that "my hip was rotating more yesterday" PERTINENT HISTORY:     CP (cerebral palsy), spastic, 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[name]) - Primary         She is stable, at baseline (ambulatory, but with with some gait abnormalities, balance issues, and weakness). Rechecking CBC, iron, and vitamin D today. She has been on supplementation (refilling iron for her). Will make additional changes based on labs if indicated. PT referral placed to [facility] at her request. No acute concerns today.      PAIN:  Are you having pain? No   PRECAUTIONS: None   WEIGHT BEARING RESTRICTIONS: No   FALLS:  Has patient fallen in last 6 months? Yes. 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 Hobbies- Runner, broadcasting/film/video, archery, baseball, tennis, dance    PLOF: Needs assistance with ADLs   PATIENT GOALS: Patient would like to improve her flexibility and balance.   NEXT MD VISIT:    OBJECTIVE:    DIAGNOSTIC FINDINGS:  Radiology consult  confirms lumbar scoliosis with apex at L2-3, but since patient is asymptomatic, recommend monitoring it and continue to strengthen.           COGNITION: Overall cognitive status: Within functional limits for tasks assessed                         SENSATION: Not tested     MUSCLE LENGTH: Hamstrings: Right 40 deg; Left 30 deg     POSTURE: left pelvic obliquity, weight shift right, and hips rotated to L with L LE held in IR, increased R WB     LOWER EXTREMITY ROM: Trunk lateraly flexed due to scoliosis, L hip unable to achieve neutral ER, abd limited, B hamstrings tight, L ankle does not achieve neutral DF or inversion. LUE with limited ROM at all joints, in all planes. she wears a splint on her wrist at night.      LOWER EXTREMITY MMT: RLE 4/5,  LLE with fluctuating tone throughout, HS very weak, hips also weak-MMT accuracy questionable due to her tone.   FUNCTIONAL TESTS:  5 times sit to stand: 14.77,  07/03/22 13 seconds, 09/30/22 = 12 seconds Timed up and go (TUG): 13.75 , 07/03/22 = 10 seconds, 09/30/22 9 seconds Berg Balance Scale: 48/56  10/21/22 = 49/56   GAIT: Distance walked: clinic distances Assistive device utilized: None Level of assistance: Modified independence Comments: Step-to pattern;Decreased step length - left;Decreased stance time - left;Decreased weight shift to left;Shuffle;Ataxic;Lateral hip instability;Trunk rotated posteriorly on left      TODAY'S TREATMENT:                                                                                                              DATE:  12/23/22 Nustep level 5 x 7 minutes Gait outside CGA  On airex ball toss Turn hit turn hit Side stepping over objects 4" right foot up corn hole and then with left foot up Left leg press 20# 2x10 Bridges 3x10 Left leg on sliding board with 2.5 # supine hip abduction 3x10 Left leg external rotation, some assist PROM left LE  12/18/22 Nustep level 5 x 6 minutes Step turn hit with person on each side of her 5# leg curls left only  Leg extension 5# left eccentrics On airex corn hole CGA Right foot up on 6" step corn hole very difficult Left foot on 6" box corn hole On airex 10# pulls and 5# pulls Passive right LE stretches Foot on sliding board in supine hip abduction Walking ball hits  12/16/22 Bike level 4 x 7 minutes Step turn touch In pbars on bosu balance and then marching On rockerboard 2ways Step turn hit the ball with two people on each side back and forth, cues needed to pick up feet and to turn right and then left 40# leg press , then left only 20# with help for knee not to go into valgus Supine with slide board left hip abduction Bridges Side lying eccentric left leg hip clams Passive stretch  to the left hip and  eg  12/04/22 Nustep level 5 x 6 minutes 10# right arm row 5# right arm extension 5# AR press  Ball toss and hits, with some stepping Ball kicks Leg press 50# both 2x10, left only 30# 2x10 Side stepping Walking ball taps On bosu balance PROM left LE Yellow tband hip extension and abduction on the left in supine  12/02/22 Nustep level 6 x 6 minutes On airex 5# arm extension and row right side Side step on and off airex Seated and standing volleyball Leg press 40# both legs 2x10, 20# left only 2x10 Right arm 8# farmer carry, right arm 4# overhead carry On bosu balance and rockerboard in Pbars Side stepping over bars, walking over bars Bridges Crunches PROM stretches of the left LE  11/27/22 NuStep L5 x75mins  STS on airex 2x10 SBA  Step ups 6"  Stairs step over step up and down Side and forward steps over foam beams Standing hip ext 2# 2x10 Standing at side of stairs heel taps with 2# 2x10 Left leg HS curls 10# 2x10 Left leg only extension 5# 2x10   11/25/22 Nustep level 5 x 6 minutes Standing balance volleyball and then on the airex Leg press 20# x10, 40# x10, 60# x10, then 40# at a lower spot for increase ROM, then left leg 20# 2x10 Left leg HS curls 10# 2x10 Left leg only extension 5# 2x10 5# on airex row and extension with SBA Side step on and off airex Stairs step over step up and down [employer] tband hip extension Green tband clamshells Sidelying clamshells Passive left LE stretches crunches  11/13/22 NuStep L5 x79mins  Supine bridges and then bridges with PT holding the left leg up 2x10  Passive stretch to the left hip Feet on ball K2C, rotations, bridges x10  On airex cone toe touches in II bars  Step ups 2x10 leading with each side  STS x10, then x10 with ball hit  PATIENT EDUCATION:  Education details: impact spasticity and inflexibility has on gait and patella tracking during ambulation; encouraged increase in stretch; consider botox or other  appropriate intervention with MD regarding spasticity (if needed); updates to HEP Person educated: Patient and Caregiver   Education method: Explanation Education comprehension: verbalized understanding   HOME EXERCISE PROGRAM: [email or web address]  Access Code: [email or web address] URL: [email or web address] Date: 07/17/2022 Prepared by: Stacie Glaze  Exercises - Supine Hip Internal and External Rotation  - 1 x daily - 7 x weekly - 2 sets - 10 reps - 3 hold - Supine Hip Abduction on Slider  - 1 x daily - 7 x weekly - 2 sets - 10 reps - 3 hold - Hooklying Clamshell with Resistance  - 1 x daily - 7 x weekly - 2 sets - 10 reps - 3 hold - Hip Abduction and Adduction Caregiver PROM  - 1 x daily - 7 x weekly - 1 sets - 5 reps - 30-60 hold - Caregiver PROM Hip Internal External Rotation  - 1 x daily - 7 x weekly - 1 sets - 5 reps - 30-60 hold - Supine Short Arc Quad  - 1 x daily - 7 x weekly - 2 sets - 10 reps - 3 hold ASSESSMENT:   CLINICAL IMPRESSION: Patient reports that her mom says she was walking with the left hip forward and in and one step at a time, they report that this was at 9:30 PM, my hope is she was fatigued, but I do not  see this today, with the right leg up o the step she still really struggles with balance and requires min A  OBJECTIVE IMPAIRMENTS: Abnormal gait, decreased balance, decreased coordination, decreased endurance, decreased mobility, difficulty walking, decreased ROM, decreased strength, increased fascial restrictions, increased muscle spasms, impaired flexibility, impaired tone, improper body mechanics, and postural dysfunction.    ACTIVITY LIMITATIONS: standing, squatting, stairs, transfers, and locomotion level   PARTICIPATION LIMITATIONS: shopping, community activity, and occupation   PERSONAL FACTORS: 1 comorbidity: CP  are also affecting patient's functional outcome.    REHAB POTENTIAL: Good   CLINICAL DECISION MAKING: Stable/uncomplicated   EVALUATION  COMPLEXITY: Moderate     GOALS: Goals reviewed with patient? Yes   SHORT TERM GOALS: Target date: 05/01/22 I with initial HEP Baseline: Goal status: MET   LONG TERM GOALS: Target date: 12/21/22   I with final HEP Baseline:  Goal status: progressing with her and caregiver 08/19/22   2.  Improve TUG time to < 12 sec Baseline:  Goal status: met 08/14/22  3.  Improve 5 x STS to <12 Baseline:  Goal status:MET 06/05/22   4.  Increase BERG score to at least 44 Baseline:  Goal status: 45/56 MET   5.  Patient will ambulate with normalized gait pattern, minimizing abnormal weight shifts and increasing LLE position to more neutral hip rotation. Baseline:  Goal status: ongoing 08/28/22, progressing 10/02/22, ongoing 11/13/22  6. Patient will report no falls in a 4 week period             Baseline: 5 falls in 8 days 11/04/22             Goal status: had a fall 2 weeks ago 12/18/22     PLAN:   PT FREQUENCY: 1-2x/week   PT DURATION: 10 weeks   PLANNED INTERVENTIONS: Therapeutic exercises, Therapeutic activity, Neuromuscular re-education, Balance training, Gait training, Patient/Family education, Self Care, Joint mobilization, Dry Needling, Cryotherapy, Moist heat, Ionotophoresis 4mg /ml Dexamethasone, and Manual therapy   PLAN FOR NEXT SESSION: work on hip ER and abduction as well as quad strength add more balance  5:03 PM,12/23/22 Stacie Glaze,  PT

## 2022-12-23 NOTE — Therapy (Deleted)
OUTPATIENT OCCUPATIONAL THERAPY NEURO  treatment  Patient Name: Kerri Robertson MRN: [id number] DOB:2001/03/18, 22 y.o., female Today's Date: 12/23/2022  PCP: none REFERRING PROVIDER: Dr. Reola Calkins  END OF SESSION:    Past Medical History:  Diagnosis Date   Allergy    Cerebral palsy ([name])    CP (cerebral palsy) ([name])    Stroke [facility])    Past Surgical History:  Procedure Laterality Date   ARM SURGERY     2013   FOOT SURGERY     2010   HAMSTRING LENGTHENING     TIBIA / FIBIA LENGTHENING     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  Rationale for Evaluation and Treatment: Rehabilitation  SUBJECTIVE:   SUBJECTIVE STATEMENT: Pt reports difficulty using her LUE for ADLs. 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 WUJ:WJXBJYN of multiple surgeries, particularly on the upper and lower extremities, generalized weakness, more pronounced on the left side, and a contracture in the left arm   PRECAUTIONS: Fall  WEIGHT BEARING RESTRICTIONS: No  PAIN:  Are you having pain? No  FALLS: Has patient fallen in last 6 months? Yes.   LIVING ENVIRONMENT: Lives with: lives with their family Lives  in: House/apartment   PLOF: Needs assistance with ADLs and Needs assistance with homemaking  PATIENT GOALS: increase LUE functional use  OBJECTIVE:   HAND DOMINANCE: Right  ADLs:Pt reports difficulty with the following tasks; button jeans, donn/ doff earrings, open water bottles Pt/ mom report she uses LUE only 5% of the time. Overall ADLs: increased time required Transfers/ambulation related to ADLs:mod I with hx of falls Eating: needs assist with cutting food , difficulty with spreading cream cheese Grooming: needs assist with brushing and putty hair in a pony tail difficulty opening mascara and lip gloss UB Dressing: mod I  LB Dressing: needs assist  with tying and getting shoes on Toileting: mod I Bathing: needs assist with washing hair, difficulty washing R side Tub Shower transfers:  has sliding tub bench , supervision Equipment: Transfer tub bench  IADLs: Shopping: need assist for  Light housekeeping: does laundry , cleans bathroom, dusts  Meal Prep: pt makes mac and cheese, garlic  bread in frying pan  however pt needs tortilla bag Community mobility: mod I Medication management: needs reminders, pt sorts  R handed   MOBILITY STATUS: Independent    UPPER EXTREMITY ROM:    Active ROM Right eval Left eval  Shoulder flexion  105  Shoulder abduction    Shoulder adduction    Shoulder extension    Shoulder internal rotation    Shoulder external rotation    Elbow flexion  125  Elbow extension  -35  Wrist flexion  -65 at rest  Wrist extension  -65/-15 P/ROM  Wrist ulnar deviation    Wrist radial deviation    Wrist pronation    Wrist supination  Unable/ 75% P/ROM  (Blank rows = not tested)  HAND FUNCTION: Grip strength: Right: NT lbs; Left: NT lbs  COORDINATION: LUE is impaired pt was able to pick up a block 1/3 trials to place in container  SENSATION: Not tested    MUSCLE TONE: LUE: Mild and Hypertonic  COGNITION: Overall cognitive status:   decreased short term memory per PT report    VISION ASSESSMENT: Not tested     OBSERVATIONS: Pt is motivated  and pleasant .   TODAY'S TREATMENT:                                                                                                                              DATE: 12/17/22- eval only   PATIENT EDUCATION: Education details: role of OT, potential goals Person educated: Patient and Parent Education method: Explanation Education comprehension: verbalized understanding  HOME EXERCISE PROGRAM: N/a   GOALS: Goals reviewed with patient? Yes  SHORT TERM GOALS: Target date: 01/16/23  I with initial HEP.  Goal status: INITIAL  2.  Pt will report using LUE as a gross assist at least 10% of the time for ADLs.   Goal status: INITIAL  3.  Pt will demonstrate ability to pick up a 1 inch block with LUE 2/5 trials consistently.  Goal status: INITIAL  4.  I with positioning to minimize contracture including splinting prn Baseline:  Goal status: INITIAL  5.  Pt will increase LUE A/ROM shoulder flexion to 110* for increased ease with ADLs. Baseline:  Goal status: INITIAL  6.    LONG TERM GOALS: Target date: 03/11/23  I with updated HEP.  Goal status: INITIAL  2.  Pt will report increased ease with fastening jeans, opening water bottles and donning/ doffing earrings.  Goal status: INITIAL  3.  Pt will use LUE to assist with brushing her hair. Baseline:  Goal status: INITIAL  4.   Pt will report using LUE as a gross / active assist at least 15% of the time for ADLs.  Goal status: INITIAL  5.  Pt will demonstrate -30 elbow extension in prep for functional reach.  Goal status: INITIAL  6. Assess for vivistim prn Baseline: inital    ASSESSMENT:  CLINICAL IMPRESSION: Patient is a 22 y.o. female who was seen today for occupational therapy evaluation for G80.2 (ICD-10-CM) - Spastic hemiplegic cerebral palsy ([name]). Pt presents with deficits which impede  performance of ADLs/IADLS. Pt can benefit from skilled OT to address these deficits.  PERFORMANCE DEFICITS: in functional skills including ADLs, IADLs, coordination, dexterity, proprioception, sensation, tone, ROM, strength, flexibility, Fine motor control, Gross motor control, mobility, balance, endurance, decreased knowledge of precautions, decreased knowledge of use of DME, vision, and UE functional use, cognitive skills including memory and thought, and psychosocial skills including coping strategies, environmental adaptation, habits, interpersonal interactions, and routines  and behaviors.   IMPAIRMENTS: are limiting patient from ADLs, IADLs, work, play, leisure, and social participation.   CO-MORBIDITIES: may have co-morbidities  that affects occupational performance. Patient will benefit from skilled OT to address above impairments and improve overall function.  MODIFICATION OR ASSISTANCE TO COMPLETE EVALUATION: No modification of tasks or assist necessary to complete an evaluation.  OT OCCUPATIONAL PROFILE AND HISTORY: Detailed assessment: Review of records and additional review of physical, cognitive, psychosocial history related to current functional performance.  CLINICAL DECISION MAKING: LOW - limited treatment options, no task modification necessary  REHAB POTENTIAL: Good  EVALUATION COMPLEXITY: Low    PLAN:  OT FREQUENCY: 2x/week plus eval  OT DURATION: 12 weeks  PLANNED INTERVENTIONS: self care/ADL training, therapeutic exercise, therapeutic activity, neuromuscular re-education, manual therapy, passive range of motion, functional mobility training, splinting, electrical stimulation, ultrasound, paraffin, moist heat, cryotherapy, contrast bath, patient/family education, cognitive remediation/compensation, visual/perceptual remediation/compensation, energy conservation, coping strategies training, DME and/or AE instructions, and Re-evaluation  RECOMMENDED OTHER SERVICES:  PT  CONSULTED AND AGREED WITH PLAN OF CARE: Patient and family member/caregiver  PLAN FOR NEXT SESSION: initial HEP   Myria Steenbergen, OT 12/23/2022, 1:30 PM

## 2022-12-24 ENCOUNTER — Encounter: Payer: Self-pay | Admitting: Physical Therapy

## 2022-12-25 ENCOUNTER — Ambulatory Visit: Payer: BC Managed Care – PPO | Admitting: Physical Therapy

## 2022-12-25 ENCOUNTER — Ambulatory Visit: Payer: BC Managed Care – PPO

## 2022-12-25 DIAGNOSIS — I69954 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: Secondary | ICD-10-CM

## 2022-12-25 DIAGNOSIS — R278 Other lack of coordination: Secondary | ICD-10-CM

## 2022-12-25 DIAGNOSIS — R2681 Unsteadiness on feet: Secondary | ICD-10-CM

## 2022-12-25 DIAGNOSIS — R262 Difficulty in walking, not elsewhere classified: Secondary | ICD-10-CM

## 2022-12-25 DIAGNOSIS — M6281 Muscle weakness (generalized): Secondary | ICD-10-CM

## 2022-12-25 DIAGNOSIS — G800 Spastic quadriplegic cerebral palsy: Secondary | ICD-10-CM

## 2022-12-25 DIAGNOSIS — R2689 Other abnormalities of gait and mobility: Secondary | ICD-10-CM

## 2022-12-25 DIAGNOSIS — G801 Spastic diplegic cerebral palsy: Secondary | ICD-10-CM

## 2022-12-25 DIAGNOSIS — R531 Weakness: Secondary | ICD-10-CM

## 2022-12-25 NOTE — Therapy (Signed)
OUTPATIENT PHYSICAL THERAPY TREATMENT   Patient Name: Kerri Robertson MRN: [id number] DOB:07/22/2000, 22 y.o., female Today's Date: 04/10/2022   END OF SESSION:  PT End of Session - 12/25/22 0924     Visit Number 48    Date for PT Re-Evaluation 02/22/23    Authorization Type [employer]    PT Start Time 0925    PT Stop Time 1010    PT Time Calculation (min) 45 min    Activity Tolerance Patient tolerated treatment well    Behavior During Therapy WFL for tasks assessed/performed                       Past Medical History:  Diagnosis Date   Allergy     Cerebral palsy ([name])     Stroke [facility])           Past Surgical History:  Procedure Laterality Date   ARM SURGERY        2013   FOOT SURGERY        2010   HAMSTRING LENGTHENING       TIBIA / FIBIA LENGTHENING            Patient Active Problem List    Diagnosis Date Noted   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   Balance problem 05/06/2018   Syncope 05/06/2018   History of cerebrovascular accident 11/18/2017   CP (cerebral palsy), spastic, 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[name]) 06/25/2017   Anxiety state 06/25/2017   Spasticity 06/25/2017   Allergic rhinitis 10/21/2016   Mild intermittent asthma 10/21/2016      PCP: Clayborne Dana, NP   REFERRING PROVIDER: Clayborne Dana, NP   REFERRING DIAG: G80.0 (ICD-10-CM) - CP (cerebral palsy), spastic, 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[name])    THERAPY DIAG:  Spastic quadriplegic cerebral palsy ([name])   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  REports that "my hip was rotating more yesterday" PERTINENT HISTORY:     CP (cerebral palsy), spastic, 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[name]) - Primary         She is stable, at baseline (ambulatory, but with with some gait abnormalities, balance issues, and weakness). Rechecking CBC, iron, and vitamin D today. She has been on supplementation (refilling iron for her). Will make additional changes based on labs if indicated. PT referral placed to [facility] at her request. No acute concerns today.      PAIN:  Are you having pain? No   PRECAUTIONS: None   WEIGHT BEARING RESTRICTIONS: No   FALLS:  Has patient fallen in last 6 months? Yes. 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 Hobbies- Runner, broadcasting/film/video, archery, baseball, tennis, dance    PLOF: Needs assistance with ADLs   PATIENT GOALS: Patient would like to improve her flexibility and balance.   NEXT MD VISIT:    OBJECTIVE:    DIAGNOSTIC FINDINGS:  Radiology  consult confirms lumbar scoliosis with apex at L2-3, but since patient is asymptomatic, recommend monitoring it and continue to strengthen.           COGNITION: Overall cognitive status: Within functional limits for tasks assessed                         SENSATION: Not tested     MUSCLE LENGTH: Hamstrings: Right 40 deg; Left 30 deg     POSTURE: left pelvic obliquity, weight shift right, and hips rotated to L with L LE held in IR, increased R WB     LOWER EXTREMITY ROM: Trunk lateraly flexed due to scoliosis, L hip unable to achieve neutral ER, abd limited, B hamstrings tight, L ankle does not achieve neutral DF or inversion. LUE with limited ROM at all joints, in all planes. she wears a splint on her wrist at night.      LOWER EXTREMITY MMT: RLE 4/5,  LLE with fluctuating tone throughout, HS very weak, hips also weak-MMT accuracy questionable due to her tone.   FUNCTIONAL TESTS:  5 times sit to stand: 14.77,  07/03/22 13 seconds, 09/30/22 = 12 seconds Timed up and go (TUG): 13.75 , 07/03/22 = 10 seconds, 09/30/22 9 seconds Berg Balance Scale: 48/56  10/21/22 = 49/56   GAIT: Distance walked: clinic distances Assistive device utilized: None Level of assistance: Modified independence Comments: Step-to pattern;Decreased step length - left;Decreased stance time - left;Decreased weight shift to left;Shuffle;Ataxic;Lateral hip instability;Trunk rotated posteriorly on left      TODAY'S TREATMENT:                                                                                                              DATE:  12/25/22 NuStep L5 x88mins Gait outside CGA small island  Side stepson beam Turn and hit ball Side step onto airex In pbars on bosu balance On rockerboard 2ways Standing on airex rows and ext green band 2x10 40# leg press, 20# LLE x10    12/23/22 Nustep level 5 x 7 minutes Gait outside CGA  On airex ball toss Turn hit turn hit Side stepping over objects 4" right foot up corn hole and then with left foot up Left leg press 20# 2x10 Bridges 3x10 Left leg on sliding board with 2.5 # supine hip abduction 3x10 Left leg external rotation, some assist PROM left LE  12/18/22 Nustep level 5 x 6 minutes Step turn hit with person on each side of her 5# leg curls left only  Leg extension 5# left eccentrics On airex corn hole CGA Right foot up on 6" step corn hole very difficult Left foot on 6" box corn hole On airex 10# pulls and 5# pulls Passive right LE stretches Foot on sliding board in supine hip abduction Walking ball hits  12/16/22 Bike level 4 x 7 minutes Step turn touch In pbars on bosu balance and then marching On rockerboard 2ways Step turn hit the ball with two people on each side back and  forth, cues needed to pick up  feet and to turn right and then left 40# leg press , then left only 20# with help for knee not to go into valgus Supine with slide board left hip abduction Bridges Side lying eccentric left leg hip clams Passive stretch to the left hip and eg  12/04/22 Nustep level 5 x 6 minutes 10# right arm row 5# right arm extension 5# AR press  Ball toss and hits, with some stepping Ball kicks Leg press 50# both 2x10, left only 30# 2x10 Side stepping Walking ball taps On bosu balance PROM left LE Yellow tband hip extension and abduction on the left in supine  12/02/22 Nustep level 6 x 6 minutes On airex 5# arm extension and row right side Side step on and off airex Seated and standing volleyball Leg press 40# both legs 2x10, 20# left only 2x10 Right arm 8# farmer carry, right arm 4# overhead carry On bosu balance and rockerboard in Pbars Side stepping over bars, walking over bars Bridges Crunches PROM stretches of the left LE  11/27/22 NuStep L5 x35mins  STS on airex 2x10 SBA  Step ups 6"  Stairs step over step up and down Side and forward steps over foam beams Standing hip ext 2# 2x10 Standing at side of stairs heel taps with 2# 2x10 Left leg HS curls 10# 2x10 Left leg only extension 5# 2x10   11/25/22 Nustep level 5 x 6 minutes Standing balance volleyball and then on the airex Leg press 20# x10, 40# x10, 60# x10, then 40# at a lower spot for increase ROM, then left leg 20# 2x10 Left leg HS curls 10# 2x10 Left leg only extension 5# 2x10 5# on airex row and extension with SBA Side step on and off airex Stairs step over step up and down [employer] tband hip extension Green tband clamshells Sidelying clamshells Passive left LE stretches crunches  11/13/22 NuStep L5 x1mins  Supine bridges and then bridges with PT holding the left leg up 2x10  Passive stretch to the left hip Feet on ball K2C, rotations, bridges x10  On airex cone toe touches in II bars  Step ups 2x10  leading with each side  STS x10, then x10 with ball hit  PATIENT EDUCATION:  Education details: impact spasticity and inflexibility has on gait and patella tracking during ambulation; encouraged increase in stretch; consider botox or other appropriate intervention with MD regarding spasticity (if needed); updates to HEP Person educated: Patient and Caregiver   Education method: Explanation Education comprehension: verbalized understanding   HOME EXERCISE PROGRAM: [email or web address]  Access Code: [email or web address] URL: [email or web address] Date: 07/17/2022 Prepared by: Stacie Glaze  Exercises - Supine Hip Internal and External Rotation  - 1 x daily - 7 x weekly - 2 sets - 10 reps - 3 hold - Supine Hip Abduction on Slider  - 1 x daily - 7 x weekly - 2 sets - 10 reps - 3 hold - Hooklying Clamshell with Resistance  - 1 x daily - 7 x weekly - 2 sets - 10 reps - 3 hold - Hip Abduction and Adduction Caregiver PROM  - 1 x daily - 7 x weekly - 1 sets - 5 reps - 30-60 hold - Caregiver PROM Hip Internal External Rotation  - 1 x daily - 7 x weekly - 1 sets - 5 reps - 30-60 hold - Supine Short Arc Quad  - 1 x daily - 7 x weekly - 2 sets - 10 reps -  3 hold ASSESSMENT:   CLINICAL IMPRESSION: Patient is doing well. She was able to demonstrate good balance on the BOSU and rocker board today. Unable to do small marches on BOSU. She seems to do better balance and stability wise in the mornings, may be more fatigued with afternoon appointments.    OBJECTIVE IMPAIRMENTS: Abnormal gait, decreased balance, decreased coordination, decreased endurance, decreased mobility, difficulty walking, decreased ROM, decreased strength, increased fascial restrictions, increased muscle spasms, impaired flexibility, impaired tone, improper body mechanics, and postural dysfunction.    ACTIVITY LIMITATIONS: standing, squatting, stairs, transfers, and locomotion level   PARTICIPATION LIMITATIONS: shopping, community  activity, and occupation   PERSONAL FACTORS: 1 comorbidity: CP  are also affecting patient's functional outcome.    REHAB POTENTIAL: Good   CLINICAL DECISION MAKING: Stable/uncomplicated   EVALUATION COMPLEXITY: Moderate     GOALS: Goals reviewed with patient? Yes   SHORT TERM GOALS: Target date: 05/01/22 I with initial HEP Baseline: Goal status: MET   LONG TERM GOALS: Target date: 12/21/22   I with final HEP Baseline:  Goal status: progressing with her and caregiver 08/19/22   2.  Improve TUG time to < 12 sec Baseline:  Goal status: met 08/14/22  3.  Improve 5 x STS to <12 Baseline:  Goal status:MET 06/05/22   4.  Increase BERG score to at least 44 Baseline:  Goal status: 45/56 MET   5.  Patient will ambulate with normalized gait pattern, minimizing abnormal weight shifts and increasing LLE position to more neutral hip rotation. Baseline:  Goal status: ongoing 08/28/22, progressing 10/02/22, ongoing 11/13/22  6. Patient will report no falls in a 4 week period             Baseline: 5 falls in 8 days 11/04/22             Goal status: had a fall 2 weeks ago 12/18/22     PLAN:   PT FREQUENCY: 1-2x/week   PT DURATION: 10 weeks   PLANNED INTERVENTIONS: Therapeutic exercises, Therapeutic activity, Neuromuscular re-education, Balance training, Gait training, Patient/Family education, Self Care, Joint mobilization, Dry Needling, Cryotherapy, Moist heat, Ionotophoresis 4mg /ml Dexamethasone, and Manual therapy   PLAN FOR NEXT SESSION: work on hip ER and abduction as well as quad strength add more balance  10:12 AM,12/25/22 Stacie Glaze,  PT

## 2022-12-29 ENCOUNTER — Encounter: Payer: Self-pay | Admitting: Occupational Therapy

## 2022-12-29 ENCOUNTER — Ambulatory Visit: Payer: BC Managed Care – PPO | Admitting: Occupational Therapy

## 2022-12-29 DIAGNOSIS — I69954 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: Secondary | ICD-10-CM

## 2022-12-29 DIAGNOSIS — R278 Other lack of coordination: Secondary | ICD-10-CM

## 2022-12-29 DIAGNOSIS — M6281 Muscle weakness (generalized): Secondary | ICD-10-CM | POA: Diagnosis not present

## 2022-12-29 DIAGNOSIS — R293 Abnormal posture: Secondary | ICD-10-CM

## 2022-12-29 DIAGNOSIS — R2681 Unsteadiness on feet: Secondary | ICD-10-CM

## 2022-12-29 NOTE — Therapy (Signed)
OUTPATIENT OCCUPATIONAL THERAPY NEURO  treatment  Patient Name: Kerri Robertson MRN: [id number] DOB:02-18-2001, 22 y.o., female Today's Date: 12/29/2022  PCP: none REFERRING PROVIDER: Dr. Reola Calkins  END OF SESSION:  OT End of Session - 12/29/22 0824     Visit Number 2    Number of Visits 25    Date for OT Re-Evaluation 03/11/23    Authorization Time Period 12 weeks    OT Start Time 0801    OT Stop Time 0843    OT Time Calculation (min) 42 min    Behavior During Therapy [facility] for tasks assessed/performed              Past Medical History:  Diagnosis Date   Allergy    Cerebral palsy ([name])    CP (cerebral palsy) ([name])    Stroke ([name])    Past Surgical History:  Procedure Laterality Date   ARM SURGERY     2013   FOOT SURGERY     2010   HAMSTRING LENGTHENING     TIBIA / FIBIA LENGTHENING     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   Balance problem 05/06/2018   Syncope 05/06/2018   History of cerebrovascular accident 11/18/2017   Anxiety state 06/25/2017   Spasticity 06/25/2017   Allergic rhinitis 10/21/2016   Mild intermittent asthma 10/21/2016    ONSET DATE: 12/01/22  REFERRING DIAG: G80.2 (ICD-10-CM) - Spastic hemiplegic cerebral palsy ([name])  THERAPY DIAG:  Muscle weakness (generalized)  Unsteadiness on feet  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 CZY:[id number] of multiple surgeries, particularly on the upper and lower extremities, generalized weakness, more pronounced on the left side, and a contracture in the left arm   PRECAUTIONS: Fall  WEIGHT BEARING RESTRICTIONS: No  PAIN:  Are you having pain? No  FALLS: Has patient fallen in last 6 months? Yes.   LIVING ENVIRONMENT: Lives with: lives with their family Lives in: House/apartment   PLOF: Needs assistance with ADLs and Needs assistance with homemaking  PATIENT GOALS: increase LUE functional use  OBJECTIVE:   HAND DOMINANCE: Right  ADLs:Pt reports difficulty with the following tasks; button jeans, donn/ doff earrings, open water bottles Pt/ mom report she uses LUE only 5% of the time. Overall ADLs: increased time required Transfers/ambulation related to ADLs:mod I with hx of falls Eating: needs assist with cutting food , difficulty with spreading cream cheese Grooming: needs assist with brushing and putty hair in a pony tail difficulty opening mascara and lip gloss UB Dressing: mod I  LB Dressing: needs assist  with tying and getting shoes on Toileting: mod I Bathing: needs assist with washing hair, difficulty washing R side Tub Shower transfers:  has sliding tub bench , supervision Equipment: Transfer tub bench  IADLs: Shopping: need assist for  Light housekeeping: does laundry , cleans bathroom, dusts  Meal Prep:  pt makes mac and cheese, garlic  bread in frying pan  however pt needs tortilla bag Community mobility: mod I Medication management: needs reminders, pt sorts  R handed   MOBILITY STATUS: Independent    UPPER EXTREMITY ROM:    Active ROM Right eval Left eval  Shoulder flexion  105  Shoulder abduction    Shoulder adduction    Shoulder extension    Shoulder internal rotation    Shoulder external rotation     Elbow flexion  125  Elbow extension  -35  Wrist flexion  -65 at rest  Wrist extension  -65/-15 P/ROM  Wrist ulnar deviation    Wrist radial deviation    Wrist pronation    Wrist supination  Unable/ 75% P/ROM  (Blank rows = not tested)  HAND FUNCTION: Grip strength: Right: NT lbs; Left: NT lbs  COORDINATION: LUE is impaired pt was able to pick up a block 1/3 trials to place in container  SENSATION: Not tested    MUSCLE TONE: LUE: Mild and Hypertonic  COGNITION: Overall cognitive status:  decreased short term memory per PT report    VISION ASSESSMENT: Not tested     OBSERVATIONS: Pt is motivated  and pleasant .   TODAY'S TREATMENT:                                                                                                                              DATE:12/29/22- Seated edge of mat weightbearing through left elbow over ball with body on arm movements and lateral trunk flexion, min-mod facilitation/ v.c Standing edge of mat weightbearing through physio ball on mat for shoulder flexion and weightbearing, min facilitation P/ROM forearm supination, then pt performed on herself with min v.c and demonstration P/ROM finger and thumb extension Functional grasp/ release of wooden dowel pegs to remove from pegboard then to replace with min-mod facilitation/ v.c, mod difficulty. Pt simulated washing her legs with LUE using hand over hand assist with washcloth.       12/17/22- eval only   PATIENT EDUCATION: Education details:  see above Person educated: Patient and caregiver Education method: Explanation Education comprehension: verbalized understanding  HOME EXERCISE PROGRAM: N/a   GOALS: Goals reviewed with patient? Yes  SHORT TERM GOALS: Target date: 01/16/23  I with initial HEP.  Goal status: INITIAL  2.  Pt will report using LUE as a gross assist at least 10% of the time for ADLs.   Goal status: INITIAL  3.  Pt will demonstrate ability to pick up  a 1 inch block with LUE 2/5 trials consistently.  Goal status: INITIAL  4.  I with positioning to minimize contracture including splinting prn Baseline:  Goal status: INITIAL  5.  Pt will increase LUE A/ROM shoulder flexion to 110* for increased ease with ADLs. Baseline:  Goal status: INITIAL  6.    LONG TERM GOALS: Target date: 03/11/23  I with updated HEP.  Goal status: INITIAL  2.  Pt will report increased ease with fastening jeans, opening water bottles and donning/ doffing earrings.  Goal status: INITIAL  3.  Pt will use LUE to assist with brushing her hair. Baseline:  Goal status: INITIAL  4.   Pt will report using LUE as a gross / active assist at least 15% of the time for ADLs.  Goal status: INITIAL  5.  Pt will demonstrate -30 elbow extension in prep for functional reach.  Goal status: INITIAL  6. Assess for vivistim prn Baseline: inital    ASSESSMENT:  CLINICAL IMPRESSION: Patient is progressing towards goals for LUE functional use.  PERFORMANCE DEFICITS: in functional skills including ADLs, IADLs, coordination, dexterity, proprioception, sensation, tone, ROM, strength, flexibility, Fine motor control, Gross motor control, mobility, balance, endurance, decreased knowledge of precautions, decreased knowledge of use of DME, vision, and UE functional use, cognitive skills including memory and thought, and psychosocial skills including coping strategies, environmental adaptation, habits, interpersonal interactions, and routines and behaviors.   IMPAIRMENTS: are limiting patient from ADLs, IADLs, work, play, leisure, and social participation.   CO-MORBIDITIES: may have co-morbidities  that affects occupational performance. Patient will benefit from skilled OT to address above impairments and improve overall function.  MODIFICATION OR ASSISTANCE TO COMPLETE EVALUATION: No modification of tasks or assist necessary to complete an evaluation.  OT OCCUPATIONAL  PROFILE AND HISTORY: Detailed assessment: Review of records and additional review of physical, cognitive, psychosocial history related to current functional performance.  CLINICAL DECISION MAKING: LOW - limited treatment options, no task modification necessary  REHAB POTENTIAL: Good  EVALUATION COMPLEXITY: Low    PLAN:  OT FREQUENCY: 2x/week plus eval  OT DURATION: 12 weeks  PLANNED INTERVENTIONS: self care/ADL training, therapeutic exercise, therapeutic activity, neuromuscular re-education, manual therapy, passive range of motion, functional mobility training, splinting, electrical stimulation, ultrasound, paraffin, moist heat, cryotherapy, contrast bath, patient/family education, cognitive remediation/compensation, visual/perceptual remediation/compensation, energy conservation, coping strategies training, DME and/or AE instructions, and Re-evaluation  RECOMMENDED OTHER SERVICES: PT  CONSULTED AND AGREED WITH PLAN OF CARE: Patient and family member/caregiver  PLAN FOR NEXT SESSION: NMR, initial HEP   Dayrin Stallone, OT 12/29/2022, 9:12 AM

## 2022-12-30 ENCOUNTER — Ambulatory Visit: Payer: BC Managed Care – PPO

## 2022-12-30 ENCOUNTER — Ambulatory Visit: Payer: BC Managed Care – PPO | Admitting: Occupational Therapy

## 2022-12-30 DIAGNOSIS — I69954 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: Secondary | ICD-10-CM

## 2022-12-30 DIAGNOSIS — R278 Other lack of coordination: Secondary | ICD-10-CM

## 2022-12-30 DIAGNOSIS — M6281 Muscle weakness (generalized): Secondary | ICD-10-CM | POA: Diagnosis not present

## 2022-12-30 DIAGNOSIS — R262 Difficulty in walking, not elsewhere classified: Secondary | ICD-10-CM

## 2022-12-30 DIAGNOSIS — R2681 Unsteadiness on feet: Secondary | ICD-10-CM

## 2022-12-30 DIAGNOSIS — R293 Abnormal posture: Secondary | ICD-10-CM

## 2022-12-30 DIAGNOSIS — R2689 Other abnormalities of gait and mobility: Secondary | ICD-10-CM

## 2022-12-30 NOTE — Patient Instructions (Signed)
Lateral Weight Shift: Upper Trunk Leading   Sit with feet flat on floor. Bring __left__ shoulder, head and arm toward side until forearm/elbow just touches sitting surface. Hold __10__ seconds.  Reach with right arm, Then thread the needle Repeat _10___ times per session. Do __2__ sessions per day.   ed.  Marland Kitchen

## 2022-12-30 NOTE — Therapy (Signed)
OUTPATIENT OCCUPATIONAL THERAPY NEURO  treatment  Patient Name: Kerri Robertson MRN: [id number] DOB:[birth year], 22 y.o., female Today's Date: 12/31/2022  PCP: none REFERRING PROVIDER: Dr. Reola Calkins  END OF SESSION:  OT End of Session - 12/31/22 0813     Visit Number 3    Number of Visits 25    Date for OT Re-Evaluation 03/11/23    Authorization Type BCBS/Medicaid    Authorization Time Period 12 weeks    OT Start Time 1448    OT Stop Time 1530    OT Time Calculation (min) 42 min    Behavior During Therapy [facility] for tasks assessed/performed               Past Medical History:  Diagnosis Date   Allergy    Cerebral palsy ([name])    CP (cerebral palsy) ([name])    Stroke ([name])    Past Surgical History:  Procedure Laterality Date   ARM SURGERY     2013   FOOT SURGERY     2010   HAMSTRING LENGTHENING     TIBIA / FIBIA LENGTHENING     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   Balance problem 05/06/2018   Syncope 05/06/2018   History of cerebrovascular accident 11/18/2017   Anxiety state 06/25/2017   Spasticity 06/25/2017   Allergic rhinitis 10/21/2016   Mild intermittent asthma 10/21/2016    ONSET DATE: 12/01/22  REFERRING DIAG: G80.2 (ICD-10-CM) - Spastic hemiplegic cerebral palsy ([name])  THERAPY DIAG:  Muscle weakness (generalized)  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)  Other lack of coordination  Abnormal posture  Rationale for Evaluation and Treatment: Rehabilitation  SUBJECTIVE:   SUBJECTIVE STATEMENT: Pt reports difficulty using  her LUE for ADLs. 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 NWG:[id number] of multiple surgeries, particularly on the upper and lower extremities, generalized weakness, more pronounced on the left side, and a contracture in the left arm   PRECAUTIONS: Fall  WEIGHT BEARING RESTRICTIONS: No  PAIN:  Are you having pain? No  FALLS: Has patient fallen in last 6 months? Yes.   LIVING ENVIRONMENT: Lives with: lives with their family Lives in: House/apartment   PLOF: Needs assistance with ADLs and Needs assistance with homemaking  PATIENT GOALS: increase LUE functional use  OBJECTIVE:   HAND DOMINANCE: Right  ADLs:Pt reports difficulty with the following tasks; button jeans, donn/ doff earrings, open water bottles Pt/ mom report she uses LUE only 5% of the time. Overall ADLs: increased time required Transfers/ambulation related to ADLs:mod I with hx of falls Eating: needs assist with cutting food , difficulty with spreading cream cheese Grooming: needs assist with brushing and putty hair in a pony tail difficulty opening mascara and lip gloss UB Dressing: mod I  LB Dressing: needs assist  with tying and getting shoes on Toileting: mod I Bathing: needs assist with washing hair, difficulty washing R side Tub Shower transfers:  has sliding tub bench , supervision Equipment: Transfer tub bench  IADLs: Shopping: need assist for  Light housekeeping: does laundry , cleans bathroom, dusts  Meal Prep: pt makes mac and cheese, garlic  bread in frying pan  however pt needs tortilla bag Community mobility: mod I Medication management: needs reminders, pt sorts  R handed   MOBILITY STATUS: Independent    UPPER EXTREMITY ROM:    Active ROM Right eval Left eval  Shoulder flexion  105  Shoulder abduction    Shoulder adduction    Shoulder extension    Shoulder internal rotation    Shoulder external  rotation    Elbow flexion  125  Elbow extension  -35  Wrist flexion  -65 at rest  Wrist extension  -65/-15 P/ROM  Wrist ulnar deviation    Wrist radial deviation    Wrist pronation    Wrist supination  Unable/ 75% P/ROM  (Blank rows = not tested)  HAND FUNCTION: Grip strength: Right: NT lbs; Left: NT lbs  COORDINATION: LUE is impaired pt was able to pick up a block 1/3 trials to place in container  SENSATION: Not tested    MUSCLE TONE: LUE: Mild and Hypertonic  COGNITION: Overall cognitive status:  decreased short term memory per PT report    VISION ASSESSMENT: Not tested     OBSERVATIONS: Pt is motivated  and pleasant .   TODAY'S TREATMENT:                                                                                                                              DATE:12/30/22-sidelying on mat, therapist performed scapular mobs, followed by supine, pt self ROM shoulder flexion with therapist facilitating scapula. Seated edge of mat weightbearing through left elbow over ball with body on arm movements and lateral trunk flexion, followed by thread the needle min facilitation/ v.c, (Pt repeated with elbow supported on 2 pillows) Prone on elbows, lifting chest for weightbearing and scapular activation, min facilitation/ v.c. Quadraped weightbearing through physio ball on elbows rolling forwards and backwards for shoulder flexion and weightbearing, min facilitation P/ROM forearm supination, then P/ROM finger and thumb extension Self stretch reach for the floor, min v.c Functional grasp/ release of 1 inch blocks to place in container, mod facilitation/ v.c  12/29/22- Seated edge of mat weightbearing through left elbow over ball with body on arm movements and lateral trunk flexion, min-mod facilitation/ v.c Standing edge of mat weightbearing through physio ball on mat for shoulder flexion and weightbearing, min facilitation P/ROM forearm supination, then pt performed on  herself with min v.c and demonstration P/ROM finger and thumb extension Functional grasp/ release of wooden dowel pegs to remove from pegboard then to replace with min-mod facilitation/ v.c, mod difficulty. Pt simulated washing her legs with LUE using hand over hand assist with washcloth.       12/17/22- eval only   PATIENT EDUCATION: Education details:   weightbearing through elbow- pt was instructed to only perform with supervision Person educated: Patient and caregiver Education method: Explanation, demonstration, v.c Education comprehension: verbalized understanding, returned demonstration  HOME EXERCISE PROGRAM:  N/a   GOALS: Goals reviewed with patient? Yes  SHORT TERM GOALS: Target date: 01/16/23  I with initial HEP.  Goal status:  ongoing, needs reinforcement  2.  Pt will report using LUE as a gross assist at least 10% of the time for ADLs.   Goal status: INITIAL  3.  Pt will demonstrate ability to pick up a 1 inch block with LUE 2/5 trials consistently.  Goal status: INITIAL  4.  I with positioning to minimize contracture including splinting prn Baseline:  Goal status: INITIAL  5.  Pt will increase LUE A/ROM shoulder flexion to 110* for increased ease with ADLs. Baseline:  Goal status: INITIAL  6.    LONG TERM GOALS: Target date: 03/11/23  I with updated HEP.  Goal status: INITIAL  2.  Pt will report increased ease with fastening jeans, opening water bottles and donning/ doffing earrings.  Goal status: INITIAL  3.  Pt will use LUE to assist with brushing her hair. Baseline:  Goal status: INITIAL  4.   Pt will report using LUE as a gross / active assist at least 15% of the time for ADLs.  Goal status: INITIAL  5.  Pt will demonstrate -30 elbow extension in prep for functional reach.  Goal status: INITIAL  6. Assess for vivistim prn Baseline: inital    ASSESSMENT:  CLINICAL IMPRESSION: Patient is progressing towards goals. She  demonstrates understanding of initial HEP.  PERFORMANCE DEFICITS: in functional skills including ADLs, IADLs, coordination, dexterity, proprioception, sensation, tone, ROM, strength, flexibility, Fine motor control, Gross motor control, mobility, balance, endurance, decreased knowledge of precautions, decreased knowledge of use of DME, vision, and UE functional use, cognitive skills including memory and thought, and psychosocial skills including coping strategies, environmental adaptation, habits, interpersonal interactions, and routines and behaviors.   IMPAIRMENTS: are limiting patient from ADLs, IADLs, work, play, leisure, and social participation.   CO-MORBIDITIES: may have co-morbidities  that affects occupational performance. Patient will benefit from skilled OT to address above impairments and improve overall function.  MODIFICATION OR ASSISTANCE TO COMPLETE EVALUATION: No modification of tasks or assist necessary to complete an evaluation.  OT OCCUPATIONAL PROFILE AND HISTORY: Detailed assessment: Review of records and additional review of physical, cognitive, psychosocial history related to current functional performance.  CLINICAL DECISION MAKING: LOW - limited treatment options, no task modification necessary  REHAB POTENTIAL: Good  EVALUATION COMPLEXITY: Low    PLAN:  OT FREQUENCY: 2x/week plus eval  OT DURATION: 12 weeks  PLANNED INTERVENTIONS: self care/ADL training, therapeutic exercise, therapeutic activity, neuromuscular re-education, manual therapy, passive range of motion, functional mobility training, splinting, electrical stimulation, ultrasound, paraffin, moist heat, cryotherapy, contrast bath, patient/family education, cognitive remediation/compensation, visual/perceptual remediation/compensation, energy conservation, coping strategies training, DME and/or AE instructions, and Re-evaluation  RECOMMENDED OTHER SERVICES: PT  CONSULTED AND AGREED WITH PLAN OF CARE:  Patient and family member/caregiver  PLAN FOR NEXT SESSION: NMR,  functional use of LUE.   Knox Holdman, OT 12/31/2022, 8:14 AM

## 2022-12-30 NOTE — Therapy (Signed)
OUTPATIENT PHYSICAL THERAPY TREATMENT   Patient Name: Roselena Reyman MRN: [id number] DOB:2000/11/26, 22 y.o., female Today's Date: 04/10/2022   END OF SESSION:  PT End of Session - 12/30/22 1711     Visit Number 49    Date for PT Re-Evaluation 02/22/23    Authorization Type BCBS    PT Start Time 1713    PT Stop Time 1756    PT Time Calculation (min) 43 min    Activity Tolerance Patient tolerated treatment well    Behavior During Therapy WFL for tasks assessed/performed                        Past Medical History:  Diagnosis Date   Allergy     Cerebral palsy ([name])     Stroke ([name])           Past Surgical History:  Procedure Laterality Date   ARM SURGERY        2013   FOOT SURGERY        2010   HAMSTRING LENGTHENING       TIBIA / FIBIA LENGTHENING            Patient Active Problem List    Diagnosis Date Noted   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   Balance problem 05/06/2018   Syncope 05/06/2018   History of cerebrovascular accident 11/18/2017   CP (cerebral palsy), spastic, 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[name]) 06/25/2017   Anxiety state 06/25/2017   Spasticity 06/25/2017   Allergic rhinitis 10/21/2016   Mild intermittent asthma 10/21/2016      PCP: Clayborne Dana, NP   REFERRING PROVIDER: Clayborne Dana, NP   REFERRING DIAG: G80.0 (ICD-10-CM) - CP (cerebral palsy), spastic, 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[name])    THERAPY DIAG:  Spastic quadriplegic cerebral palsy ([name])   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)   Other lack of coordination   Unsteadiness on feet   Difficulty in walking, not elsewhere classified   Left-sided weakness   Muscle weakness (generalized)   Abnormal posture   Cerebral palsy with spastic diplegia ([name])   Rationale for Evaluation and Treatment: Rehabilitation   ONSET DATE: 03/17/22   SUBJECTIVE:    SUBJECTIVE  STATEMENT: I had a fall recently, it was in the bathroom. I was able to get up on my own.    PERTINENT HISTORY:     CP (cerebral palsy), spastic, 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[name]) - Primary         She is stable, at baseline (ambulatory, but with with some gait abnormalities, balance issues, and weakness). Rechecking CBC, iron, and vitamin D today. She has been on supplementation (refilling iron for her). Will make additional changes based on labs if indicated. PT referral placed to [facility] at her request. No acute concerns today.      PAIN:  Are you having pain? No   PRECAUTIONS: None   WEIGHT BEARING RESTRICTIONS: No   FALLS:  Has patient fallen in last 6 months? Yes. 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 Hobbies- Runner, broadcasting/film/video, archery, baseball, tennis, dance    PLOF: Needs assistance with ADLs   PATIENT GOALS: Patient would like to improve her flexibility and balance.   NEXT MD VISIT:  OBJECTIVE:    DIAGNOSTIC FINDINGS:  Radiology consult confirms lumbar scoliosis with apex at L2-3, but since patient is asymptomatic, recommend monitoring it and continue to strengthen.           COGNITION: Overall cognitive status: Within functional limits for tasks assessed                         SENSATION: Not tested     MUSCLE LENGTH: Hamstrings: Right 40 deg; Left 30 deg     POSTURE: left pelvic obliquity, weight shift right, and hips rotated to L with L LE held in IR, increased R WB     LOWER EXTREMITY ROM: Trunk lateraly flexed due to scoliosis, L hip unable to achieve neutral ER, abd limited, B hamstrings tight, L ankle does not achieve neutral DF or inversion. LUE with limited ROM at all joints, in all planes. she wears a splint on her wrist at night.      LOWER EXTREMITY MMT: RLE  4/5, LLE with fluctuating tone throughout, HS very weak, hips also weak-MMT accuracy questionable due to her tone.   FUNCTIONAL TESTS:  5 times sit to stand: 14.77,  07/03/22 13 seconds, 09/30/22 = 12 seconds Timed up and go (TUG): 13.75 , 07/03/22 = 10 seconds, 09/30/22 9 seconds Berg Balance Scale: 48/56  10/21/22 = 49/56   GAIT: Distance walked: clinic distances Assistive device utilized: None Level of assistance: Modified independence Comments: Step-to pattern;Decreased step length - left;Decreased stance time - left;Decreased weight shift to left;Shuffle;Ataxic;Lateral hip instability;Trunk rotated posteriorly on left      TODAY'S TREATMENT:                                                                                                              DATE:  12/30/22 NuStep L5 x20mins  5# leg curls left only 2x10 Leg extension 5# left eccentrics 2x10 Side stepping over obstacles  Stepping and hitting Ball kicks 2x10 each side  Left leg on sliding board with 2.5 # supine hip abduction and flexion 2x10 Bridges 2x10    12/25/22 NuStep L5 x83mins Gait outside CGA small island  Side stepson beam Turn and hit ball Side step onto airex In pbars on bosu balance On rockerboard 2ways Standing on airex rows and ext green band 2x10 40# leg press, 20# LLE x10    12/23/22 Nustep level 5 x 7 minutes Gait outside CGA  On airex ball toss Turn hit turn hit Side stepping over objects 4" right foot up corn hole and then with left foot up Left leg press 20# 2x10 Bridges 3x10 Left leg on sliding board with 2.5 # supine hip abduction 3x10 Left leg external rotation, some assist PROM left LE  12/18/22 Nustep level 5 x 6 minutes Step turn hit with person on each side of her 5# leg curls left only  Leg extension 5# left eccentrics On airex corn hole CGA Right foot up on 6" step corn hole very difficult Left foot on 6" box corn  hole On airex 10# pulls and 5# pulls Passive right LE  stretches Foot on sliding board in supine hip abduction Walking ball hits  12/16/22 Bike level 4 x 7 minutes Step turn touch In pbars on bosu balance and then marching On rockerboard 2ways Step turn hit the ball with two people on each side back and forth, cues needed to pick up feet and to turn right and then left 40# leg press , then left only 20# with help for knee not to go into valgus Supine with slide board left hip abduction Bridges Side lying eccentric left leg hip clams Passive stretch to the left hip and eg  12/04/22 Nustep level 5 x 6 minutes 10# right arm row 5# right arm extension 5# AR press  Ball toss and hits, with some stepping Ball kicks Leg press 50# both 2x10, left only 30# 2x10 Side stepping Walking ball taps On bosu balance PROM left LE Yellow tband hip extension and abduction on the left in supine  12/02/22 Nustep level 6 x 6 minutes On airex 5# arm extension and row right side Side step on and off airex Seated and standing volleyball Leg press 40# both legs 2x10, 20# left only 2x10 Right arm 8# farmer carry, right arm 4# overhead carry On bosu balance and rockerboard in Pbars Side stepping over bars, walking over bars Bridges Crunches PROM stretches of the left LE  11/27/22 NuStep L5 x35mins  STS on airex 2x10 SBA  Step ups 6"  Stairs step over step up and down Side and forward steps over foam beams Standing hip ext 2# 2x10 Standing at side of stairs heel taps with 2# 2x10 Left leg HS curls 10# 2x10 Left leg only extension 5# 2x10   11/25/22 Nustep level 5 x 6 minutes Standing balance volleyball and then on the airex Leg press 20# x10, 40# x10, 60# x10, then 40# at a lower spot for increase ROM, then left leg 20# 2x10 Left leg HS curls 10# 2x10 Left leg only extension 5# 2x10 5# on airex row and extension with SBA Side step on and off airex Stairs step over step up and down [employer] tband hip extension Green tband  clamshells Sidelying clamshells Passive left LE stretches crunches  11/13/22 NuStep L5 x57mins  Supine bridges and then bridges with PT holding the left leg up 2x10  Passive stretch to the left hip Feet on ball K2C, rotations, bridges x10  On airex cone toe touches in II bars  Step ups 2x10 leading with each side  STS x10, then x10 with ball hit  PATIENT EDUCATION:  Education details: impact spasticity and inflexibility has on gait and patella tracking during ambulation; encouraged increase in stretch; consider botox or other appropriate intervention with MD regarding spasticity (if needed); updates to HEP Person educated: Patient and Caregiver   Education method: Explanation Education comprehension: verbalized understanding   HOME EXERCISE PROGRAM: [email or web address]  Access Code: [email or web address] URL: [email or web address] Date: 07/17/2022 Prepared by: Stacie Glaze  Exercises - Supine Hip Internal and External Rotation  - 1 x daily - 7 x weekly - 2 sets - 10 reps - 3 hold - Supine Hip Abduction on Slider  - 1 x daily - 7 x weekly - 2 sets - 10 reps - 3 hold - Hooklying Clamshell with Resistance  - 1 x daily - 7 x weekly - 2 sets - 10 reps - 3 hold - Hip Abduction and Adduction Caregiver PROM  - 1 x daily -  7 x weekly - 1 sets - 5 reps - 30-60 hold - Caregiver PROM Hip Internal External Rotation  - 1 x daily - 7 x weekly - 1 sets - 5 reps - 30-60 hold - Supine Short Arc Quad  - 1 x daily - 7 x weekly - 2 sets - 10 reps - 3 hold ASSESSMENT:   CLINICAL IMPRESSION: Patient reports another fall while going to the bathroom. Was able to get up on her own and denies any injuries. With side steps over obstacles, she takes extra steps to gather her balance. Is able to do without taking extra steps after cueing. She was able to do stepping and hitting without doing a 360 turn today. Continue to work on her balance and functional strengthening. Mom has some concerns of her left hip/foot  turning in more than usual.   OBJECTIVE IMPAIRMENTS: Abnormal gait, decreased balance, decreased coordination, decreased endurance, decreased mobility, difficulty walking, decreased ROM, decreased strength, increased fascial restrictions, increased muscle spasms, impaired flexibility, impaired tone, improper body mechanics, and postural dysfunction.    ACTIVITY LIMITATIONS: standing, squatting, stairs, transfers, and locomotion level   PARTICIPATION LIMITATIONS: shopping, community activity, and occupation   PERSONAL FACTORS: 1 comorbidity: CP  are also affecting patient's functional outcome.    REHAB POTENTIAL: Good   CLINICAL DECISION MAKING: Stable/uncomplicated   EVALUATION COMPLEXITY: Moderate     GOALS: Goals reviewed with patient? Yes   SHORT TERM GOALS: Target date: 05/01/22 I with initial HEP Baseline: Goal status: MET   LONG TERM GOALS: Target date: 12/21/22   I with final HEP Baseline:  Goal status: progressing with her and caregiver 08/19/22   2.  Improve TUG time to < 12 sec Baseline:  Goal status: met 08/14/22  3.  Improve 5 x STS to <12 Baseline:  Goal status:MET 06/05/22   4.  Increase BERG score to at least 44 Baseline:  Goal status: 45/56 MET   5.  Patient will ambulate with normalized gait pattern, minimizing abnormal weight shifts and increasing LLE position to more neutral hip rotation. Baseline:  Goal status: ongoing 08/28/22, progressing 10/02/22, ongoing 11/13/22  6. Patient will report no falls in a 4 week period             Baseline: 5 falls in 8 days 11/04/22             Goal status: had a fall 2 weeks ago 12/18/22     PLAN:   PT FREQUENCY: 1-2x/week   PT DURATION: 10 weeks   PLANNED INTERVENTIONS: Therapeutic exercises, Therapeutic activity, Neuromuscular re-education, Balance training, Gait training, Patient/Family education, Self Care, Joint mobilization, Dry Needling, Cryotherapy, Moist heat, Ionotophoresis 4mg /ml Dexamethasone, and Manual  therapy   PLAN FOR NEXT SESSION: work on hip ER and abduction as well as quad strength add more balance  5:56 PM,12/30/22 Stacie Glaze,  PT

## 2022-12-31 ENCOUNTER — Encounter: Payer: Self-pay | Admitting: Occupational Therapy

## 2023-01-01 ENCOUNTER — Ambulatory Visit: Payer: BC Managed Care – PPO

## 2023-01-07 ENCOUNTER — Ambulatory Visit: Payer: BC Managed Care – PPO | Attending: Neurology | Admitting: Occupational Therapy

## 2023-01-07 DIAGNOSIS — R293 Abnormal posture: Secondary | ICD-10-CM | POA: Diagnosis present

## 2023-01-07 DIAGNOSIS — R2681 Unsteadiness on feet: Secondary | ICD-10-CM | POA: Diagnosis present

## 2023-01-07 DIAGNOSIS — R2689 Other abnormalities of gait and mobility: Secondary | ICD-10-CM | POA: Insufficient documentation

## 2023-01-07 DIAGNOSIS — R278 Other lack of coordination: Secondary | ICD-10-CM | POA: Insufficient documentation

## 2023-01-07 DIAGNOSIS — M6281 Muscle weakness (generalized): Secondary | ICD-10-CM | POA: Diagnosis present

## 2023-01-07 DIAGNOSIS — I69954 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: Secondary | ICD-10-CM | POA: Diagnosis present

## 2023-01-07 DIAGNOSIS — G800 Spastic quadriplegic cerebral palsy: Secondary | ICD-10-CM | POA: Insufficient documentation

## 2023-01-07 NOTE — Therapy (Unsigned)
OUTPATIENT OCCUPATIONAL THERAPY NEURO  treatment  Patient Name: Kerri Robertson MRN: [id number] DOB:06/04/2000, 22 y.o., female Today's Date: 01/08/2023  PCP: none REFERRING PROVIDER: Dr. Reola Calkins  END OF SESSION:  OT End of Session - 01/08/23 1435     Visit Number 4    Number of Visits 25    Date for OT Re-Evaluation 03/11/23    Authorization Type BCBS/Medicaid    Authorization Time Period 12 weeks    OT Start Time 1450    OT Stop Time 1530    OT Time Calculation (min) 40 min                Past Medical History:  Diagnosis Date   Allergy    Cerebral palsy ([name])    CP (cerebral palsy) ([name])    Stroke ([name])    Past Surgical History:  Procedure Laterality Date   ARM SURGERY     2013   FOOT SURGERY     2010   HAMSTRING LENGTHENING     TIBIA / FIBIA LENGTHENING     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   Balance problem 05/06/2018   Syncope 05/06/2018   History of cerebrovascular accident 11/18/2017   Anxiety state 06/25/2017   Spasticity 06/25/2017   Allergic rhinitis 10/21/2016   Mild intermittent asthma 10/21/2016    ONSET DATE: 12/01/22  REFERRING DIAG: G80.2 (ICD-10-CM) - Spastic hemiplegic cerebral palsy ([name])  THERAPY DIAG:  Muscle weakness (generalized)  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)  Other lack of coordination  Abnormal posture  Unsteadiness on feet  Other abnormalities of gait and mobility  Rationale for Evaluation and Treatment: Rehabilitation  SUBJECTIVE:   SUBJECTIVE STATEMENT: Pt reports using  her LUE more at home Pt accompanied QM:[id number]  PERTINENT HISTORY: 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 BMW:[id number] of multiple surgeries, particularly on the upper and lower extremities, generalized weakness, more pronounced on the left side, and a contracture in the left arm   PRECAUTIONS: Fall  WEIGHT BEARING RESTRICTIONS: No  PAIN:  Are you having pain? No  FALLS: Has patient fallen in last 6 months? Yes.   LIVING ENVIRONMENT: Lives with: lives with their family Lives in: House/apartment   PLOF: Needs assistance with ADLs and Needs assistance with homemaking  PATIENT GOALS: increase LUE functional use  OBJECTIVE:   HAND DOMINANCE: Right  ADLs:Pt reports difficulty with the following tasks; button jeans, donn/ doff earrings, open water bottles Pt/ mom report she uses LUE only 5% of the time. Overall ADLs: increased time required Transfers/ambulation related to ADLs:mod I with hx of falls Eating: needs assist with cutting food , difficulty with spreading cream cheese Grooming: needs assist with brushing and putty hair in a pony tail difficulty opening mascara and lip gloss UB Dressing: mod I  LB Dressing: needs assist  with tying and getting shoes on Toileting: mod I Bathing: needs assist with washing hair, difficulty washing R side Tub Shower transfers:  has sliding tub bench , supervision Equipment: Transfer tub bench  IADLs: Shopping: need assist for  Light housekeeping: does laundry , cleans bathroom, dusts  Meal Prep: pt makes mac and cheese, garlic  bread in frying pan  however pt needs tortilla bag Community mobility: mod I Medication management: needs reminders, pt sorts  R handed   MOBILITY STATUS: Independent    UPPER EXTREMITY ROM:    Active ROM Right eval Left eval  Shoulder flexion  105  Shoulder abduction    Shoulder adduction    Shoulder extension    Shoulder internal rotation    Shoulder external rotation     Elbow flexion  125  Elbow extension  -35  Wrist flexion  -65 at rest  Wrist extension  -65/-15 P/ROM  Wrist ulnar deviation    Wrist radial deviation    Wrist pronation    Wrist supination  Unable/ 75% P/ROM  (Blank rows = not tested)  HAND FUNCTION: Grip strength: Right: NT lbs; Left: NT lbs  COORDINATION: LUE is impaired pt was able to pick up a block 1/3 trials to place in container  SENSATION: Not tested    MUSCLE TONE: LUE: Mild and Hypertonic  COGNITION: Overall cognitive status:  decreased short term memory per PT report    VISION ASSESSMENT: Not tested     OBSERVATIONS: Pt is motivated  and pleasant .   TODAY'S TREATMENT:                                                                                                                              DATE:01/07/23 Pt brought in her previously issued pre-fab splints which she has not been wearing consistently. Pt donned the thumb spica sleeve first. Gentle passive stretch to digits followed by grasp/ release of wooden dowel pegs while wear splint and without, min-mod facilitation/ v.c. Thumb spica portion appears to assist with grasp. Therapist then assessed the pan / resting splint portion. It appears to fit well and pt was encouraged to wear for 30 mins- 1 hour daily when watching TV.   12/30/22-sidelying on mat, therapist performed scapular mobs, followed by supine, pt self ROM shoulder flexion with therapist facilitating scapula. Seated edge of mat weightbearing through left elbow over ball with body on arm movements and lateral trunk flexion, followed by thread the needle min facilitation/ v.c, (Pt repeated with elbow supported on 2 pillows) Prone on elbows, lifting chest for weightbearing and scapular activation, min facilitation/ v.c. Quadraped weightbearing through physio ball on elbows rolling forwards and backwards for shoulder flexion and weightbearing, min facilitation P/ROM forearm supination, then P/ROM  finger and thumb extension Self stretch reach for the floor, min v.c Functional grasp/ release of 1 inch blocks to place in container, mod facilitation/ v.c  12/29/22- Seated edge of mat weightbearing through left elbow over ball with body on arm movements and lateral trunk flexion, min-mod facilitation/ v.c Standing edge of mat weightbearing through physio ball on mat for shoulder flexion and weightbearing, min facilitation P/ROM forearm supination, then pt performed on herself with min v.c and demonstration P/ROM finger and thumb  extension Functional grasp/ release of wooden dowel pegs to remove from pegboard then to replace with min-mod facilitation/ v.c, mod difficulty. Pt simulated washing her legs with LUE using hand over hand assist with washcloth.       12/17/22- eval only   PATIENT EDUCATION: Education details:   splint recommendations Person educated: Patient and caregiver Education method: Explanation, demonstration, v.c Education comprehension: verbalized understanding, returned demonstration  HOME EXERCISE PROGRAM: N/a   GOALS: Goals reviewed with patient? Yes  SHORT TERM GOALS: Target date: 01/16/23  I with initial HEP.  Goal status:  ongoing, needs reinforcement 01/07/23  2.  Pt will report using LUE as a gross assist at least 10% of the time for ADLs.   Goal status: INITIAL  3.  Pt will demonstrate ability to pick up a 1 inch block with LUE 2/5 trials consistently.  Goal status: INITIAL  4.  I with positioning to minimize contracture including splinting prn Baseline:  Goal status:  ongoing 01/07/23  5.  Pt will increase LUE A/ROM shoulder flexion to 110* for increased ease with ADLs. Baseline:  Goal status: INITIAL  6.    LONG TERM GOALS: Target date: 03/11/23  I with updated HEP.  Goal status: INITIAL  2.  Pt will report increased ease with fastening jeans, opening water bottles and donning/ doffing earrings.  Goal status: INITIAL  3.  Pt will  use LUE to assist with brushing her hair. Baseline:  Goal status: INITIAL  4.   Pt will report using LUE as a gross / active assist at least 15% of the time for ADLs.  Goal status: INITIAL  5.  Pt will demonstrate -30 elbow extension in prep for functional reach.  Goal status: INITIAL  6. Assess for vivistim prn Baseline: inital    ASSESSMENT:  CLINICAL IMPRESSION: Patient is progressing towards goals. She  verbalizes understanding of splint wear recommendations.  PERFORMANCE DEFICITS: in functional skills including ADLs, IADLs, coordination, dexterity, proprioception, sensation, tone, ROM, strength, flexibility, Fine motor control, Gross motor control, mobility, balance, endurance, decreased knowledge of precautions, decreased knowledge of use of DME, vision, and UE functional use, cognitive skills including memory and thought, and psychosocial skills including coping strategies, environmental adaptation, habits, interpersonal interactions, and routines and behaviors.   IMPAIRMENTS: are limiting patient from ADLs, IADLs, work, play, leisure, and social participation.   CO-MORBIDITIES: may have co-morbidities  that affects occupational performance. Patient will benefit from skilled OT to address above impairments and improve overall function.  MODIFICATION OR ASSISTANCE TO COMPLETE EVALUATION: No modification of tasks or assist necessary to complete an evaluation.  OT OCCUPATIONAL PROFILE AND HISTORY: Detailed assessment: Review of records and additional review of physical, cognitive, psychosocial history related to current functional performance.  CLINICAL DECISION MAKING: LOW - limited treatment options, no task modification necessary  REHAB POTENTIAL: Good  EVALUATION COMPLEXITY: Low    PLAN:  OT FREQUENCY: 2x/week plus eval  OT DURATION: 12 weeks  PLANNED INTERVENTIONS: self care/ADL training, therapeutic exercise, therapeutic activity, neuromuscular re-education,  manual therapy, passive range of motion, functional mobility training, splinting, electrical stimulation, ultrasound, paraffin, moist heat, cryotherapy, contrast bath, patient/family education, cognitive remediation/compensation, visual/perceptual remediation/compensation, energy conservation, coping strategies training, DME and/or AE instructions, and Re-evaluation  RECOMMENDED OTHER SERVICES: PT  CONSULTED AND AGREED WITH PLAN OF CARE: Patient and family member/caregiver  PLAN FOR NEXT SESSION: NMR,  functional use of LUE.   Trashawn Oquendo, OT 01/08/2023, 2:36 PM

## 2023-01-07 NOTE — Patient Instructions (Signed)
Wear your thumb brace and pan splint for 30 minutes to 1 hour each evening while watching TV.  Remove splint if pressure areas, pain or redness. Gradually increase wear time to 2 hours  at a time if no problems.  You can wear the thumb brace portion only while picking up objects, work on extending fingers.

## 2023-01-12 ENCOUNTER — Encounter: Payer: Self-pay | Admitting: Occupational Therapy

## 2023-01-12 ENCOUNTER — Ambulatory Visit: Payer: BC Managed Care – PPO | Admitting: Occupational Therapy

## 2023-01-12 DIAGNOSIS — I69954 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: Secondary | ICD-10-CM

## 2023-01-12 DIAGNOSIS — M6281 Muscle weakness (generalized): Secondary | ICD-10-CM

## 2023-01-12 DIAGNOSIS — R2681 Unsteadiness on feet: Secondary | ICD-10-CM

## 2023-01-12 DIAGNOSIS — R278 Other lack of coordination: Secondary | ICD-10-CM

## 2023-01-12 DIAGNOSIS — R2689 Other abnormalities of gait and mobility: Secondary | ICD-10-CM

## 2023-01-12 DIAGNOSIS — R293 Abnormal posture: Secondary | ICD-10-CM

## 2023-01-12 DIAGNOSIS — G800 Spastic quadriplegic cerebral palsy: Secondary | ICD-10-CM

## 2023-01-12 NOTE — Therapy (Signed)
OUTPATIENT OCCUPATIONAL THERAPY NEURO  treatment  Patient Name: Kerri Robertson MRN: [id number] DOB:[birth year], 22 y.o., female Today's Date: 01/12/2023  PCP: none REFERRING PROVIDER: Dr. Reola Calkins  END OF SESSION:  OT End of Session - 01/12/23 1443     Visit Number 5    Number of Visits 25    Date for OT Re-Evaluation 03/11/23    Authorization Type BCBS/Medicaid    Authorization Time Period 12 weeks    OT Start Time 1022    OT Stop Time 1102    OT Time Calculation (min) 40 min    Activity Tolerance Patient tolerated treatment well    Behavior During Therapy WFL for tasks assessed/performed                Past Medical History:  Diagnosis Date   Allergy    Cerebral palsy ([name])    CP (cerebral palsy) ([name])    Stroke [facility])    Past Surgical History:  Procedure Laterality Date   ARM SURGERY     2013   FOOT SURGERY     2010   HAMSTRING LENGTHENING     TIBIA / FIBIA LENGTHENING     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   Balance problem 05/06/2018   Syncope 05/06/2018   History of cerebrovascular accident 11/18/2017   Anxiety state 06/25/2017   Spasticity 06/25/2017   Allergic rhinitis 10/21/2016   Mild intermittent asthma 10/21/2016    ONSET DATE: 12/01/22  REFERRING DIAG: G80.2 (ICD-10-CM) - Spastic hemiplegic cerebral palsy ([name])  THERAPY DIAG:  Muscle weakness (generalized)  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)  Other lack of coordination  Abnormal posture  Unsteadiness on feet  Other abnormalities of gait and  mobility  Spastic quadriplegic cerebral palsy ([name])  Rationale for Evaluation and Treatment: Rehabilitation  SUBJECTIVE:   SUBJECTIVE STATEMENT: Pt  denies pain Pt accompanied [id number]  PERTINENT HISTORY: 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 [id number] of multiple surgeries, particularly on the upper and lower extremities, generalized weakness, more pronounced on the left side, and a contracture in the left arm   PRECAUTIONS: Fall  WEIGHT BEARING RESTRICTIONS: No  PAIN:  Are you having pain? No  FALLS: Has patient fallen in last 6 months? Yes.   LIVING ENVIRONMENT: Lives with: lives with their family Lives in: House/apartment   PLOF: Needs assistance with ADLs and Needs assistance with homemaking  PATIENT GOALS: increase LUE functional use  OBJECTIVE:   HAND DOMINANCE: Right  ADLs:Pt reports difficulty with the following tasks; button jeans, donn/ doff earrings, open water bottles Pt/ mom report she uses LUE only 5% of the time. Overall ADLs: increased time required Transfers/ambulation related to ADLs:mod I with hx of falls Eating: needs assist with cutting food , difficulty with spreading cream cheese Grooming: needs assist with brushing and putty hair in a pony tail difficulty opening mascara and lip gloss UB Dressing: mod I  LB Dressing: needs assist  with tying and getting shoes on Toileting: mod I Bathing: needs assist with washing hair, difficulty washing R side Tub Shower transfers:  has sliding tub bench ,  supervision Equipment: Transfer tub bench  IADLs: Shopping: need assist for  Light housekeeping: does laundry , cleans bathroom, dusts  Meal Prep: pt makes mac and cheese, garlic  bread in frying pan  however pt needs tortilla bag Community mobility: mod I Medication management: needs reminders, pt sorts  R handed   MOBILITY STATUS: Independent    UPPER EXTREMITY ROM:    Active ROM Right eval Left eval   Shoulder flexion  105  Shoulder abduction    Shoulder adduction    Shoulder extension    Shoulder internal rotation    Shoulder external rotation    Elbow flexion  125  Elbow extension  -35  Wrist flexion  -65 at rest  Wrist extension  -65/-15 P/ROM  Wrist ulnar deviation    Wrist radial deviation    Wrist pronation    Wrist supination  Unable/ 75% P/ROM  (Blank rows = not tested)  HAND FUNCTION: Grip strength: Right: NT lbs; Left: NT lbs  COORDINATION: LUE is impaired pt was able to pick up a block 1/3 trials to place in container  SENSATION: Not tested    MUSCLE TONE: LUE: Mild and Hypertonic  COGNITION: Overall cognitive status:  decreased short term memory per PT report    VISION ASSESSMENT: Not tested     OBSERVATIONS: Pt is motivated  and pleasant .   TODAY'S TREATMENT:                                                                                                                              DATE:01/12/23-Pt reports wearing splint 30 mins-1 hour at home without issues.Seated edge of mat weightbearing through left elbow over foam roll, with body on arm movements and lateral trunk flexion, min-mod facilitation/ v.c Supine, self ROM shoulder flexion with therapist facilitating scapula, min facilitation/ v.c Supine, passive shoulder abduction stretch with lower trunk rotation, min v.c and mod facilitation. Wearing thumb portion of Beneke splint, pt performed activities to increase functional use of LUE: functional grasp/ release of cones, picking up 1 inch blocks to place in container, and folding wash cloths, mod facilitation/ v.c passive stretch to forearm and digits prior to task. 01/07/23 Pt brought in her previously issued pre-fab splints which she has not been wearing consistently. Pt donned the thumb spica sleeve first. Gentle passive stretch to digits followed by grasp/ release of wooden dowel pegs while wear splint and without, min-mod facilitation/  v.c. Thumb spica portion appears to assist with grasp. Therapist then assessed the pan / resting splint portion. It appears to fit well and pt was encouraged to wear for 30 mins- 1 hour daily when watching TV.   12/30/22-sidelying on mat, therapist performed scapular mobs, followed by supine, pt self ROM shoulder flexion with therapist facilitating scapula. Seated edge of mat weightbearing through left elbow over ball with body on arm movements and lateral trunk flexion, followed by thread the needle min facilitation/ v.c, (Pt repeated with  elbow supported on 2 pillows) Prone on elbows, lifting chest for weightbearing and scapular activation, min facilitation/ v.c. Quadraped weightbearing through physio ball on elbows rolling forwards and backwards for shoulder flexion and weightbearing, min facilitation P/ROM forearm supination, then P/ROM finger and thumb extension Self stretch reach for the floor, min v.c Functional grasp/ release of 1 inch blocks to place in container, mod facilitation/ v.c  12/29/22- Seated edge of mat weightbearing through left elbow over ball with body on arm movements and lateral trunk flexion, min-mod facilitation/ v.c Standing edge of mat weightbearing through physio ball on mat for shoulder flexion and weightbearing, min facilitation P/ROM forearm supination, then pt performed on herself with min v.c and demonstration P/ROM finger and thumb extension Functional grasp/ release of wooden dowel pegs to remove from pegboard then to replace with min-mod facilitation/ v.c, mod difficulty. Pt simulated washing her legs with LUE using hand over hand assist with washcloth.       12/17/22- eval only   PATIENT EDUCATION: Education details:   supine shoulder flexion and tableslides Person educated: Patient and caregiver Education method: Explanation, demonstration, v.c, handout Education comprehension: verbalized understanding, returned demonstration  HOME EXERCISE  PROGRAM: N/a   GOALS: Goals reviewed with patient? Yes  SHORT TERM GOALS: Target date: 01/16/23  I with initial HEP.  Goal status:  ongoing, needs reinforcement 01/07/23  2.  Pt will report using LUE as a gross assist at least 10% of the time for ADLs.   Goal status: ongoing 01/12/23  3.  Pt will demonstrate ability to pick up a 1 inch block with LUE 2/5 trials consistently.  Goal status: I ongoing 01/12/23  4.  I with positioning to minimize contracture including splinting prn Baseline:  Goal status:  ongoing 01/07/23  5.  Pt will increase LUE A/ROM shoulder flexion to 110* for increased ease with ADLs. Baseline:  Goal status: ongoing, 01/12/23  6.    LONG TERM GOALS: Target date: 03/11/23  I with updated HEP.  Goal status: INITIAL  2.  Pt will report increased ease with fastening jeans, opening water bottles and donning/ doffing earrings.  Goal status: INITIAL  3.  Pt will use LUE to assist with brushing her hair. Baseline:  Goal status: INITIAL  4.   Pt will report using LUE as a gross / active assist at least 15% of the time for ADLs.  Goal status: INITIAL  5.  Pt will demonstrate -30 elbow extension in prep for functional reach.  Goal status: INITIAL  6. Assess for vivistim prn Baseline: inital    ASSESSMENT:  CLINICAL IMPRESSION: Patient is progressing towards goals for increased LUE functional use.  PERFORMANCE DEFICITS: in functional skills including ADLs, IADLs, coordination, dexterity, proprioception, sensation, tone, ROM, strength, flexibility, Fine motor control, Gross motor control, mobility, balance, endurance, decreased knowledge of precautions, decreased knowledge of use of DME, vision, and UE functional use, cognitive skills including memory and thought, and psychosocial skills including coping strategies, environmental adaptation, habits, interpersonal interactions, and routines and behaviors.   IMPAIRMENTS: are limiting patient from ADLs,  IADLs, work, play, leisure, and social participation.   CO-MORBIDITIES: may have co-morbidities  that affects occupational performance. Patient will benefit from skilled OT to address above impairments and improve overall function.  MODIFICATION OR ASSISTANCE TO COMPLETE EVALUATION: No modification of tasks or assist necessary to complete an evaluation.  OT OCCUPATIONAL PROFILE AND HISTORY: Detailed assessment: Review of records and additional review of physical, cognitive, psychosocial history related to current functional  performance.  CLINICAL DECISION MAKING: LOW - limited treatment options, no task modification necessary  REHAB POTENTIAL: Good  EVALUATION COMPLEXITY: Low    PLAN:  OT FREQUENCY: 2x/week plus eval  OT DURATION: 12 weeks  PLANNED INTERVENTIONS: self care/ADL training, therapeutic exercise, therapeutic activity, neuromuscular re-education, manual therapy, passive range of motion, functional mobility training, splinting, electrical stimulation, ultrasound, paraffin, moist heat, cryotherapy, contrast bath, patient/family education, cognitive remediation/compensation, visual/perceptual remediation/compensation, energy conservation, coping strategies training, DME and/or AE instructions, and Re-evaluation  RECOMMENDED OTHER SERVICES: PT  CONSULTED AND AGREED WITH PLAN OF CARE: Patient and family member/caregiver  PLAN FOR NEXT SESSION: NMR,  functional use of LUE, consider Fugl-meyer in the future.   Arnie Maiolo, OT 01/12/2023, 2:44 PM

## 2023-01-12 NOTE — Patient Instructions (Signed)
Flexion (Assistive)    Clasp hands together and raise arms above head, keeping elbows as straight as possible Perform laying down Repeat _10___ times. Do _1-2___ sessions per day.  Copyright  VHI. All rights reserved.  SHOULDER: Flexion On Table    Place hands on table,bend and straighten elbows pushing towel or washcloth forwards and back.  Press hands down into table. Hold _5__ seconds. _10__ reps per set, _1-2__ sets per day, 7___ days per week   Copyright  VHI. All rights reserved.

## 2023-01-14 ENCOUNTER — Ambulatory Visit: Payer: BC Managed Care – PPO | Admitting: Occupational Therapy

## 2023-01-14 DIAGNOSIS — R2689 Other abnormalities of gait and mobility: Secondary | ICD-10-CM

## 2023-01-14 DIAGNOSIS — M6281 Muscle weakness (generalized): Secondary | ICD-10-CM

## 2023-01-14 DIAGNOSIS — R2681 Unsteadiness on feet: Secondary | ICD-10-CM

## 2023-01-14 DIAGNOSIS — R278 Other lack of coordination: Secondary | ICD-10-CM

## 2023-01-14 DIAGNOSIS — I69954 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: Secondary | ICD-10-CM

## 2023-01-14 DIAGNOSIS — R293 Abnormal posture: Secondary | ICD-10-CM

## 2023-01-15 NOTE — Therapy (Addendum)
OUTPATIENT OCCUPATIONAL THERAPY NEURO  treatment  Patient Name: Kerri Robertson MRN: [id number] DOB:[birth year], 22 y.o., female Today's Date: 01/15/2023  PCP: none REFERRING PROVIDER: Dr. Reola Calkins  END OF SESSION:  OT End of Session - 01/14/23 1508     Visit Number 6    Number of Visits 25    Date for OT Re-Evaluation 03/11/23    Authorization Type BCBS/Medicaid    Authorization Time Period 12 weeks    OT Start Time 1447    OT Stop Time 1515    OT Time Calculation (min) 28 min    Activity Tolerance Patient tolerated treatment well    Behavior During Therapy WFL for tasks assessed/performed                Past Medical History:  Diagnosis Date   Allergy    Cerebral palsy ([name])    CP (cerebral palsy) ([name])    Stroke [facility])    Past Surgical History:  Procedure Laterality Date   ARM SURGERY     2013   FOOT SURGERY     2010   HAMSTRING LENGTHENING     TIBIA / FIBIA LENGTHENING     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   Balance problem 05/06/2018   Syncope 05/06/2018   History of cerebrovascular accident 11/18/2017   Anxiety state 06/25/2017   Spasticity 06/25/2017   Allergic rhinitis 10/21/2016   Mild intermittent asthma 10/21/2016    ONSET DATE: 12/01/22  REFERRING DIAG: G80.2 (ICD-10-CM) - Spastic hemiplegic cerebral palsy ([name])  THERAPY DIAG:  Muscle weakness (generalized)  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)  Other lack of coordination  Abnormal posture  Unsteadiness on feet  Other abnormalities of gait and  mobility  Rationale for Evaluation and Treatment: Rehabilitation  SUBJECTIVE:   SUBJECTIVE STATEMENT: Pt  denies pain Pt accompanied [id number]  PERTINENT HISTORY: 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 [id number] of multiple surgeries, particularly on the upper and lower extremities, generalized weakness, more pronounced on the left side, and a contracture in the left arm   PRECAUTIONS: Fall  WEIGHT BEARING RESTRICTIONS: No  PAIN:  Are you having pain? No  FALLS: Has patient fallen in last 6 months? Yes.   LIVING ENVIRONMENT: Lives with: lives with their family Lives in: House/apartment   PLOF: Needs assistance with ADLs and Needs assistance with homemaking  PATIENT GOALS: increase LUE functional use  OBJECTIVE:   HAND DOMINANCE: Right  ADLs:Pt reports difficulty with the following tasks; button jeans, donn/ doff earrings, open water bottles Pt/ mom report she uses LUE only 5% of the time. Overall ADLs: increased time required Transfers/ambulation related to ADLs:mod I with hx of falls Eating: needs assist with cutting food , difficulty with spreading cream cheese Grooming: needs assist with brushing and putty hair in a pony tail difficulty opening mascara and lip gloss UB Dressing: mod I  LB Dressing: needs assist  with tying and getting shoes on Toileting: mod I Bathing: needs assist with washing hair, difficulty washing R side Tub Shower transfers:  has sliding tub bench , supervision Equipment: Transfer tub bench  IADLs: Shopping: need assist for  Light housekeeping: does laundry , cleans bathroom, dusts  Meal Prep: pt makes mac and cheese, garlic  bread in frying pan  however pt needs tortilla bag Community mobility: mod I Medication management: needs reminders, pt sorts  R handed   MOBILITY STATUS: Independent    UPPER EXTREMITY ROM:    Active ROM Right eval Left eval  Shoulder flexion  105  Shoulder abduction     Shoulder adduction    Shoulder extension    Shoulder internal rotation    Shoulder external rotation    Elbow flexion  125  Elbow extension  -35  Wrist flexion  -65 at rest  Wrist extension  -65/-15 P/ROM  Wrist ulnar deviation    Wrist radial deviation    Wrist pronation    Wrist supination  Unable/ 75% P/ROM  (Blank rows = not tested)  HAND FUNCTION: Grip strength: Right: NT lbs; Left: NT lbs  COORDINATION: LUE is impaired pt was able to pick up a block 1/3 trials to place in container  SENSATION: Not tested    MUSCLE TONE: LUE: Mild and Hypertonic  COGNITION: Overall cognitive status:  decreased short term memory per PT report    VISION ASSESSMENT: Not tested     OBSERVATIONS: Pt is motivated  and pleasant .   TODAY'S TREATMENT:                                                                                                                              DATE:01/14/23 Seated edge of mat weightbearing through left elbow over ball with body on arm movements and lateral trunk flexion, and performing diagonals with RUE, min-mod facilitation/ v.cP Pt's thumb spica portion of the Beneke splint was applied then pt performed the following exercise. Supine on mat holding PVC pipe, pt performed chest press, and shoulder flexion with min-mod facilitation/ v.c. Pt's caregiver took a video so that pt can perform at home. Pt/ caregiver verbalize understanding Seated at table pt performed shoulder flexion and abduction table slides min v.c  Functional grasp/ release of wooden dowel pegs while wearing splint, min-mod facilitation/ v.c Pt practiced using bilateral UE's to open an water bottle with mod facilitation. Pt requested to leave early for voice lessons.   01/12/23-Pt reports wearing splint 30 mins-1 hour at home without issues.Seated edge of mat weightbearing through left elbow over foam roll, with body on arm movements and lateral trunk flexion, min-mod facilitation/  v.c Supine, self ROM shoulder flexion with therapist facilitating scapula, min facilitation/ v.c Supine, passive shoulder abduction stretch with lower trunk rotation, min v.c and mod facilitation. Wearing thumb portion of Beneke splint, pt performed activities to increase functional use of LUE: functional grasp/ release of cones, picking up 1 inch blocks to place in container, and folding wash cloths, mod facilitation/ v.c passive stretch to forearm and digits prior to task. 01/07/23 Pt brought in her previously issued pre-fab splints which she  has not been wearing consistently. Pt donned the thumb spica sleeve first. Gentle passive stretch to digits followed by grasp/ release of wooden dowel pegs while wear splint and without, min-mod facilitation/ v.c. Thumb spica portion appears to assist with grasp. Therapist then assessed the pan / resting splint portion. It appears to fit well and pt was encouraged to wear for 30 mins- 1 hour daily when watching TV.   12/30/22-sidelying on mat, therapist performed scapular mobs, followed by supine, pt self ROM shoulder flexion with therapist facilitating scapula. Seated edge of mat weightbearing through left elbow over ball with body on arm movements and lateral trunk flexion, followed by thread the needle min facilitation/ v.c, (Pt repeated with elbow supported on 2 pillows) Prone on elbows, lifting chest for weightbearing and scapular activation, min facilitation/ v.c. Quadraped weightbearing through physio ball on elbows rolling forwards and backwards for shoulder flexion and weightbearing, min facilitation P/ROM forearm supination, then P/ROM finger and thumb extension Self stretch reach for the floor, min v.c Functional grasp/ release of 1 inch blocks to place in container, mod facilitation/ v.c  12/29/22- Seated edge of mat weightbearing through left elbow over ball with body on arm movements and lateral trunk flexion, min-mod facilitation/ v.c Standing  edge of mat weightbearing through physio ball on mat for shoulder flexion and weightbearing, min facilitation P/ROM forearm supination, then pt performed on herself with min v.c and demonstration P/ROM finger and thumb extension Functional grasp/ release of wooden dowel pegs to remove from pegboard then to replace with min-mod facilitation/ v.c, mod difficulty. Pt simulated washing her legs with LUE using hand over hand assist with washcloth.       12/17/22- eval only   PATIENT EDUCATION: Education details:    supine shoulder flexion and chest press with cane Person educated: Patient and caregiver Education method: Explanation, demonstration, v.c,  caregiver took video with pt's phone Education comprehension: verbalized understanding, returned demonstration  HOME EXERCISE PROGRAM: Weightbearing and supine shoulder flexion   GOALS: Goals reviewed with patient? Yes  SHORT TERM GOALS: Target date: 01/16/23  I with initial HEP.  Goal status:  ongoing, needs reinforcement 01/07/23  2.  Pt will report using LUE as a gross assist at least 10% of the time for ADLs.   Goal status: ongoing 01/12/23  3.  Pt will demonstrate ability to pick up a 1 inch block with LUE 2/5 trials consistently.  Goal status:  ongoing 01/12/23  4.  I with positioning to minimize contracture including splinting prn Baseline:  Goal status: met, pt is wearing her splint with resting pan portion daily 01/14/23  5.  Pt will increase LUE A/ROM shoulder flexion to 110* for increased ease with ADLs. Baseline:  Goal status: ongoing, 01/12/23  6.    LONG TERM GOALS: Target date: 03/11/23  I with updated HEP.  Goal status: INITIAL  2.  Pt will report increased ease with fastening jeans, opening water bottles and donning/ doffing earrings.  Goal status: INITIAL  3.  Pt will use LUE to assist with brushing her hair. Baseline:  Goal status: INITIAL  4.   Pt will report using LUE as a gross / active assist at  least 15% of the time for ADLs.  Goal status: INITIAL  5.  Pt will demonstrate -30 elbow extension in prep for functional reach.  Goal status: INITIAL  6. Assess for vivistim prn Baseline: inital    ASSESSMENT:  CLINICAL IMPRESSION: Patient is progressing towards goals. She demonstrates improving functional  use particularly while wearing splint.  PERFORMANCE DEFICITS: in functional skills including ADLs, IADLs, coordination, dexterity, proprioception, sensation, tone, ROM, strength, flexibility, Fine motor control, Gross motor control, mobility, balance, endurance, decreased knowledge of precautions, decreased knowledge of use of DME, vision, and UE functional use, cognitive skills including memory and thought, and psychosocial skills including coping strategies, environmental adaptation, habits, interpersonal interactions, and routines and behaviors.   IMPAIRMENTS: are limiting patient from ADLs, IADLs, work, play, leisure, and social participation.   CO-MORBIDITIES: may have co-morbidities  that affects occupational performance. Patient will benefit from skilled OT to address above impairments and improve overall function.  MODIFICATION OR ASSISTANCE TO COMPLETE EVALUATION: No modification of tasks or assist necessary to complete an evaluation.  OT OCCUPATIONAL PROFILE AND HISTORY: Detailed assessment: Review of records and additional review of physical, cognitive, psychosocial history related to current functional performance.  CLINICAL DECISION MAKING: LOW - limited treatment options, no task modification necessary  REHAB POTENTIAL: Good  EVALUATION COMPLEXITY: Low    PLAN:  OT FREQUENCY: 2x/week plus eval  OT DURATION: 12 weeks  PLANNED INTERVENTIONS: self care/ADL training, therapeutic exercise, therapeutic activity, neuromuscular re-education, manual therapy, passive range of motion, functional mobility training, splinting, electrical stimulation, ultrasound,  paraffin, moist heat, cryotherapy, contrast bath, patient/family education, cognitive remediation/compensation, visual/perceptual remediation/compensation, energy conservation, coping strategies training, DME and/or AE instructions, and Re-evaluation  RECOMMENDED OTHER SERVICES: PT  CONSULTED AND AGREED WITH PLAN OF CARE: Patient and family member/caregiver  PLAN FOR NEXT SESSION: NMR,  functional use of LUE, consider Fugl-meyer in the future.   Geovana Gebel, OT 01/15/2023, 8:42 AM

## 2023-01-19 ENCOUNTER — Ambulatory Visit: Payer: BC Managed Care – PPO | Admitting: Occupational Therapy

## 2023-01-19 DIAGNOSIS — R2681 Unsteadiness on feet: Secondary | ICD-10-CM

## 2023-01-19 DIAGNOSIS — R2689 Other abnormalities of gait and mobility: Secondary | ICD-10-CM

## 2023-01-19 DIAGNOSIS — G800 Spastic quadriplegic cerebral palsy: Secondary | ICD-10-CM

## 2023-01-19 DIAGNOSIS — R278 Other lack of coordination: Secondary | ICD-10-CM

## 2023-01-19 DIAGNOSIS — R293 Abnormal posture: Secondary | ICD-10-CM

## 2023-01-19 DIAGNOSIS — M6281 Muscle weakness (generalized): Secondary | ICD-10-CM

## 2023-01-19 DIAGNOSIS — I69954 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: Secondary | ICD-10-CM

## 2023-01-19 NOTE — Therapy (Addendum)
OUTPATIENT OCCUPATIONAL THERAPY NEURO  treatment  Patient Name: Kerri Robertson MRN: [id number] DOB:Dec 17, 2000, 22 y.o., female Today's Date: 01/19/2023  PCP: none REFERRING PROVIDER: Dr. Reola Calkins  END OF SESSION:  OT End of Session - 01/19/23 1319     Visit Number 7    Number of Visits 25    Date for OT Re-Evaluation 03/11/23    Authorization Type BCBS/Medicaid    Authorization Time Period 12 weeks    OT Start Time 0850    OT Stop Time 0928    OT Time Calculation (min) 38 min    Activity Tolerance Patient tolerated treatment well    Behavior During Therapy WFL for tasks assessed/performed                Past Medical History:  Diagnosis Date   Allergy    Cerebral palsy ([name])    CP (cerebral palsy) ([name])    Stroke ([name])    Past Surgical History:  Procedure Laterality Date   ARM SURGERY     2013   FOOT SURGERY     2010   HAMSTRING LENGTHENING     TIBIA / FIBIA LENGTHENING     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   Balance problem 05/06/2018   Syncope 05/06/2018   History of cerebrovascular accident 11/18/2017   Anxiety state 06/25/2017   Spasticity 06/25/2017   Allergic rhinitis 10/21/2016   Mild intermittent asthma 10/21/2016    ONSET DATE: 12/01/22  REFERRING DIAG: G80.2 (ICD-10-CM) - Spastic hemiplegic cerebral palsy ([name])  THERAPY DIAG:  Muscle weakness (generalized)  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)  Other lack of coordination  Abnormal posture  Unsteadiness on feet  Other abnormalities of gait and  mobility  Spastic quadriplegic cerebral palsy ([name])  Rationale for Evaluation and Treatment: Rehabilitation  SUBJECTIVE:   SUBJECTIVE STATEMENT: Pt  denies pain, reports picking up objects at home Pt accompanied NW:[id number]  PERTINENT HISTORY: 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 MVH:QIONGEX of multiple surgeries, particularly on the upper and lower extremities, generalized weakness, more pronounced on the left side, and a contracture in the left arm   PRECAUTIONS: Fall  WEIGHT BEARING RESTRICTIONS: No  PAIN:  Are you having pain? No  FALLS: Has patient fallen in last 6 months? Yes.   LIVING ENVIRONMENT: Lives with: lives with their family Lives in: House/apartment   PLOF: Needs assistance with ADLs and Needs assistance with homemaking  PATIENT GOALS: increase LUE functional use  OBJECTIVE:   HAND DOMINANCE: Right  ADLs:Pt reports difficulty with the following tasks; button jeans, donn/ doff earrings, open water bottles Pt/ mom report she uses LUE only 5% of the time. Overall ADLs: increased time required Transfers/ambulation related to ADLs:mod I with hx of falls Eating: needs assist with cutting food , difficulty with spreading cream cheese Grooming: needs assist with brushing and putty hair in a pony tail difficulty opening mascara and lip gloss UB Dressing: mod I  LB Dressing: needs assist  with tying and getting shoes on Toileting: mod I Bathing: needs assist with washing hair, difficulty washing R side Tub Shower transfers:  has sliding tub bench , supervision Equipment: Transfer tub bench  IADLs: Shopping: need assist for  Light housekeeping: does laundry , cleans bathroom, dusts  Meal Prep: pt makes mac and cheese, garlic  bread in frying pan  however pt needs tortilla bag Community mobility: mod I Medication management: needs reminders, pt sorts  R handed   MOBILITY STATUS: Independent    UPPER EXTREMITY ROM:     Active ROM Right eval Left eval  Shoulder flexion  105  Shoulder abduction    Shoulder adduction    Shoulder extension    Shoulder internal rotation    Shoulder external rotation    Elbow flexion  125  Elbow extension  -35  Wrist flexion  -65 at rest  Wrist extension  -65/-15 P/ROM  Wrist ulnar deviation    Wrist radial deviation    Wrist pronation    Wrist supination  Unable/ 75% P/ROM  (Blank rows = not tested)  HAND FUNCTION: Grip strength: Right: NT lbs; Left: NT lbs  COORDINATION: LUE is impaired pt was able to pick up a block 1/3 trials to place in container  SENSATION: Not tested    MUSCLE TONE: LUE: Mild and Hypertonic  COGNITION: Overall cognitive status:  decreased short term memory per PT report    VISION ASSESSMENT: Not tested     OBSERVATIONS: Pt is motivated  and pleasant .   TODAY'S TREATMENT:                                                                                                                              DATE:01/19/23-Seated on mat gentle passive stretch to left forearm in supination, and digits in extension.  Pt performed self stretch shoulder flexion reaching for floor, min v.c Seated edge of mat weightbearing through left elbow over ball with body on arm movements and lateral trunk flexion, and performing diagonals with RUE, min-mod facilitation/ v.c Supine on mat holding PVC pipe, pt performed chest press, and shoulder flexion with min-mod facilitation/ v.c for left arm and scapula positioning while wearing thumb spica (benke)splint  Pt's reports she has not attempted at home. Seated at table pt performed shoulder flexion and abduction table slides , 10 reps each min v.c  Functional grasp/ release of wooden dowel pegs while wearing splint, min-mod facilitation/ v.c, to remove and replace pegs with emphasis on more normal movement patterns while wearing splint. Pt reports improving ability to use her left hand for grasp/  release.  01/14/23 Seated edge of mat weightbearing through left elbow over ball with body on arm movements and lateral trunk flexion, and performing diagonals with RUE, min-mod facilitation/ v.c Pt's thumb spica portion of the Beneke splint was applied then pt performed the following exercise. Supine on mat holding PVC pipe, pt performed chest press, and shoulder flexion with min-mod facilitation/ v.c. Pt's caregiver took a video so that pt can perform at home. Pt/ caregiver verbalize understanding Seated at table pt  performed shoulder flexion and abduction table slides min v.c  Functional grasp/ release of wooden dowel pegs while wearing splint, min-mod facilitation/ v.c Pt practiced using bilateral UE's to open an water bottle with mod facilitation.Pt requested to leave early for voice lessons.   01/12/23-Pt reports wearing splint 30 mins-1 hour at home without issues.Seated edge of mat weightbearing through left elbow over foam roll, with body on arm movements and lateral trunk flexion, min-mod facilitation/ v.c Supine, self ROM shoulder flexion with therapist facilitating scapula, min facilitation/ v.c Supine, passive shoulder abduction stretch with lower trunk rotation, min v.c and mod facilitation. Wearing thumb portion of Beneke splint, pt performed activities to increase functional use of LUE: functional grasp/ release of cones, picking up 1 inch blocks to place in container, and folding wash cloths, mod facilitation/ v.c passive stretch to forearm and digits prior to task. 01/07/23 Pt brought in her previously issued pre-fab splints which she has not been wearing consistently. Pt donned the thumb spica sleeve first. Gentle passive stretch to digits followed by grasp/ release of wooden dowel pegs while wear splint and without, min-mod facilitation/ v.c. Thumb spica portion appears to assist with grasp. Therapist then assessed the pan / resting splint portion. It appears to fit well and pt was  encouraged to wear for 30 mins- 1 hour daily when watching TV.   12/30/22-sidelying on mat, therapist performed scapular mobs, followed by supine, pt self ROM shoulder flexion with therapist facilitating scapula. Seated edge of mat weightbearing through left elbow over ball with body on arm movements and lateral trunk flexion, followed by thread the needle min facilitation/ v.c, (Pt repeated with elbow supported on 2 pillows) Prone on elbows, lifting chest for weightbearing and scapular activation, min facilitation/ v.c. Quadraped weightbearing through physio ball on elbows rolling forwards and backwards for shoulder flexion and weightbearing, min facilitation P/ROM forearm supination, then P/ROM finger and thumb extension Self stretch reach for the floor, min v.c Functional grasp/ release of 1 inch blocks to place in container, mod facilitation/ v.c  12/29/22- Seated edge of mat weightbearing through left elbow over ball with body on arm movements and lateral trunk flexion, min-mod facilitation/ v.c Standing edge of mat weightbearing through physio ball on mat for shoulder flexion and weightbearing, min facilitation P/ROM forearm supination, then pt performed on herself with min v.c and demonstration P/ROM finger and thumb extension Functional grasp/ release of wooden dowel pegs to remove from pegboard then to replace with min-mod facilitation/ v.c, mod difficulty. Pt simulated washing her legs with LUE using hand over hand assist with washcloth.       12/17/22- eval only   PATIENT EDUCATION: Education details:   reviewed  supine shoulder flexion and chest press with cane Person educated: Patient and caregiver Education method: Explanation, demonstration, v.c,  Education comprehension: verbalized understanding, returned demonstration  HOME EXERCISE PROGRAM: Weightbearing and supine shoulder flexion   GOALS: Goals reviewed with patient? Yes  SHORT TERM GOALS: Target date:  01/16/23  I with initial HEP.  Goal status:  met, pt demonstrates understanding 01/19/23  2.  Pt will report using LUE as a gross assist at least 10% of the time for ADLs.   Goal status: ongoing 01/12/23  3.  Pt will demonstrate ability to pick up a 1 inch block with LUE 2/5 trials consistently.  Goal status:  ongoing 01/12/23  4.  I with positioning to minimize contracture including splinting prn Baseline:  Goal status: met, pt is wearing her splint with resting  pan portion daily 01/14/23  5.  Pt will increase LUE A/ROM shoulder flexion to 110* for increased ease with ADLs. Baseline:  Goal status: ongoing, 01/12/23  6.    LONG TERM GOALS: Target date: 03/11/23  I with updated HEP.  Goal status: INITIAL  2.  Pt will report increased ease with fastening jeans, opening water bottles and donning/ doffing earrings.  Goal status: INITIAL  3.  Pt will use LUE to assist with brushing her hair. Baseline:  Goal status: INITIAL  4.   Pt will report using LUE as a gross / active assist at least 15% of the time for ADLs.  Goal status: INITIAL  5.  Pt will demonstrate -30 elbow extension in prep for functional reach.  Goal status: INITIAL  6. Assess for vivistim prn Baseline: inital    ASSESSMENT:  CLINICAL IMPRESSION: Patient is progressing towards goals. She reports improving ability to use her LUE functionally and states she thinks she is able to stretch her left elbow out more in extension.   PERFORMANCE DEFICITS: in functional skills including ADLs, IADLs, coordination, dexterity, proprioception, sensation, tone, ROM, strength, flexibility, Fine motor control, Gross motor control, mobility, balance, endurance, decreased knowledge of precautions, decreased knowledge of use of DME, vision, and UE functional use, cognitive skills including memory and thought, and psychosocial skills including coping strategies, environmental adaptation, habits, interpersonal interactions, and  routines and behaviors.   IMPAIRMENTS: are limiting patient from ADLs, IADLs, work, play, leisure, and social participation.   CO-MORBIDITIES: may have co-morbidities  that affects occupational performance. Patient will benefit from skilled OT to address above impairments and improve overall function.  MODIFICATION OR ASSISTANCE TO COMPLETE EVALUATION: No modification of tasks or assist necessary to complete an evaluation.  OT OCCUPATIONAL PROFILE AND HISTORY: Detailed assessment: Review of records and additional review of physical, cognitive, psychosocial history related to current functional performance.  CLINICAL DECISION MAKING: LOW - limited treatment options, no task modification necessary  REHAB POTENTIAL: Good  EVALUATION COMPLEXITY: Low    PLAN:  OT FREQUENCY: 2x/week plus eval  OT DURATION: 12 weeks  PLANNED INTERVENTIONS: self care/ADL training, therapeutic exercise, therapeutic activity, neuromuscular re-education, manual therapy, passive range of motion, functional mobility training, splinting, electrical stimulation, ultrasound, paraffin, moist heat, cryotherapy, contrast bath, patient/family education, cognitive remediation/compensation, visual/perceptual remediation/compensation, energy conservation, coping strategies training, DME and/or AE instructions, and Re-evaluation  RECOMMENDED OTHER SERVICES: PT  CONSULTED AND AGREED WITH PLAN OF CARE: Patient and family member/caregiver  PLAN FOR NEXT SESSION: NMR,  functional use of LUE, consider Fugl-meyer in the future.   Antoria Lanza, OT 01/19/2023, 1:20 PM

## 2023-01-21 ENCOUNTER — Encounter: Payer: Self-pay | Admitting: Occupational Therapy

## 2023-01-21 ENCOUNTER — Ambulatory Visit: Payer: BC Managed Care – PPO | Admitting: Occupational Therapy

## 2023-01-21 DIAGNOSIS — R2681 Unsteadiness on feet: Secondary | ICD-10-CM

## 2023-01-21 DIAGNOSIS — R2689 Other abnormalities of gait and mobility: Secondary | ICD-10-CM

## 2023-01-21 DIAGNOSIS — R278 Other lack of coordination: Secondary | ICD-10-CM

## 2023-01-21 DIAGNOSIS — M6281 Muscle weakness (generalized): Secondary | ICD-10-CM | POA: Diagnosis not present

## 2023-01-21 DIAGNOSIS — R293 Abnormal posture: Secondary | ICD-10-CM

## 2023-01-21 DIAGNOSIS — I69954 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: Secondary | ICD-10-CM

## 2023-01-21 NOTE — Therapy (Signed)
OUTPATIENT OCCUPATIONAL THERAPY NEURO  treatment  Patient Name: Kerri Robertson MRN: [id number] DOB:01-16-2001, 22 y.o., female Today's Date: 01/21/2023  PCP: none REFERRING PROVIDER: Dr. Reola Calkins  END OF SESSION:  OT End of Session - 01/21/23 1443     Visit Number 8    Date for OT Re-Evaluation 03/11/23    Authorization Type BCBS/Medicaid    Authorization Time Period 12 weeks    OT Start Time 1445    OT Stop Time 1528    OT Time Calculation (min) 43 min                Past Medical History:  Diagnosis Date   Allergy    Cerebral palsy ([name])    CP (cerebral palsy) ([name])    Stroke ([name])    Past Surgical History:  Procedure Laterality Date   ARM SURGERY     2013   FOOT SURGERY     2010   HAMSTRING LENGTHENING     TIBIA / FIBIA LENGTHENING     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   Balance problem 05/06/2018   Syncope 05/06/2018   History of cerebrovascular accident 11/18/2017   Anxiety state 06/25/2017   Spasticity 06/25/2017   Allergic rhinitis 10/21/2016   Mild intermittent asthma 10/21/2016    ONSET DATE: 12/01/22  REFERRING DIAG: G80.2 (ICD-10-CM) - Spastic hemiplegic cerebral palsy ([name])  THERAPY DIAG:  Muscle weakness (generalized)  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)  Other lack of coordination  Abnormal posture  Unsteadiness on feet  Other abnormalities of gait and mobility  Rationale for Evaluation and Treatment: Rehabilitation  SUBJECTIVE:   SUBJECTIVE STATEMENT: Pt reports she has theatre  tonight  PERTINENT HISTORY: 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 FIE:PPIRJJO of multiple surgeries, particularly on the upper and lower extremities, generalized weakness, more pronounced on the left side, and a contracture in the left arm   PRECAUTIONS: Fall  WEIGHT BEARING RESTRICTIONS: No  PAIN:  Are you having pain? No  FALLS: Has patient fallen in last 6 months? Yes.   LIVING ENVIRONMENT: Lives with: lives with their family Lives in: House/apartment   PLOF: Needs assistance with ADLs and Needs assistance with homemaking  PATIENT GOALS: increase LUE functional use  OBJECTIVE:   HAND DOMINANCE: Right  ADLs:Pt reports difficulty with the following tasks; button jeans, donn/ doff earrings, open water bottles Pt/ mom report she uses LUE only 5% of the time. Overall ADLs: increased time required Transfers/ambulation related to ADLs:mod I with hx of falls Eating: needs assist with cutting food , difficulty with spreading cream cheese Grooming: needs assist with brushing and putty hair in a pony tail difficulty opening mascara and lip gloss UB Dressing: mod I  LB Dressing: needs assist  with tying and getting shoes on Toileting: mod I Bathing: needs assist with washing hair, difficulty washing R side Tub Shower transfers:  has sliding tub bench , supervision Equipment: Transfer tub bench  IADLs: Shopping: need assist for  Light housekeeping: does laundry , cleans bathroom, dusts  Meal Prep: pt makes mac and cheese, garlic  bread in frying  pan  however pt needs tortilla bag Community mobility: mod I Medication management: needs reminders, pt sorts  R handed   MOBILITY STATUS: Independent    UPPER EXTREMITY ROM:    Active ROM Right eval Left eval  Shoulder flexion  105  Shoulder abduction    Shoulder adduction    Shoulder extension    Shoulder internal rotation    Shoulder external rotation    Elbow flexion  125  Elbow extension   -35  Wrist flexion  -65 at rest  Wrist extension  -65/-15 P/ROM  Wrist ulnar deviation    Wrist radial deviation    Wrist pronation    Wrist supination  Unable/ 75% P/ROM  (Blank rows = not tested)  HAND FUNCTION: Grip strength: Right: NT lbs; Left: NT lbs  COORDINATION: LUE is impaired pt was able to pick up a block 1/3 trials to place in container  SENSATION: Not tested    MUSCLE TONE: LUE: Mild and Hypertonic  COGNITION: Overall cognitive status:  decreased short term memory per PT report    VISION ASSESSMENT: Not tested     OBSERVATIONS: Pt is motivated  and pleasant .   TODAY'S TREATMENT:                                                                                                                              DATE:01/21/23- supine on mat passive stretch to LUE in abduction and elbow extension, while pt performed lower trunk rotation. Seated edge of mat weightbearing through left elbow over ball with body on arm movements and lateral trunk flexion, and performing diagonals with RUE, min-mod facilitation/ v.c Supine on mat holding PVC pipe, pt performed chest press, and shoulder flexion with min-mod facilitation/ v.c for left arm and scapula positioning while wearing thumb spica (benke)splint  Self stretch elbow extension reaching for the floor. Standing at table wearing splint for weightbearing through ball for circumduction and rolling forwards and back with bilateral UE's, mood facilitation. Drumming with LUE to hit target mod facilitation for LUE forearm rotation, elbow flexion/ extension. Ar, bike x 3 mins with hand wrapped mod facilitation Pt requests to leave early for voice class.   01/19/23-Seated on mat gentle passive stretch to left forearm in supination, and digits in extension.  Pt performed self stretch shoulder flexion reaching for floor, min v.c Seated edge of mat weightbearing through left elbow over ball with body on arm movements and lateral trunk  flexion, and performing diagonals with RUE, min-mod facilitation/ v.c Supine on mat holding PVC pipe, pt performed chest press, and shoulder flexion with min-mod facilitation/ v.c for left arm and scapula positioning while wearing thumb spica (benke)splint  Pt's reports she has not attempted at home. Seated at table pt performed shoulder flexion and abduction table slides , 10 reps each min v.c  Functional grasp/ release of wooden dowel pegs while wearing splint, min-mod facilitation/ v.c, to remove and replace pegs with emphasis on  more normal movement patterns while wearing splint. Pt reports improving ability to use her left hand for grasp/ release.  01/14/23 Seated edge of mat weightbearing through left elbow over ball with body on arm movements and lateral trunk flexion, and performing diagonals with RUE, min-mod facilitation/ v.c Pt's thumb spica portion of the Beneke splint was applied then pt performed the following exercise. Supine on mat holding PVC pipe, pt performed chest press, and shoulder flexion with min-mod facilitation/ v.c. Pt's caregiver took a video so that pt can perform at home. Pt/ caregiver verbalize understanding Seated at table pt performed shoulder flexion and abduction table slides min v.c  Functional grasp/ release of wooden dowel pegs while wearing splint, min-mod facilitation/ v.c Pt practiced using bilateral UE's to open an water bottle with mod facilitation.Pt requested to leave early for voice lessons.   01/12/23-Pt reports wearing splint 30 mins-1 hour at home without issues.Seated edge of mat weightbearing through left elbow over foam roll, with body on arm movements and lateral trunk flexion, min-mod facilitation/ v.c Supine, self ROM shoulder flexion with therapist facilitating scapula, min facilitation/ v.c Supine, passive shoulder abduction stretch with lower trunk rotation, min v.c and mod facilitation. Wearing thumb portion of Beneke splint, pt performed  activities to increase functional use of LUE: functional grasp/ release of cones, picking up 1 inch blocks to place in container, and folding wash cloths, mod facilitation/ v.c passive stretch to forearm and digits prior to task. 01/07/23 Pt brought in her previously issued pre-fab splints which she has not been wearing consistently. Pt donned the thumb spica sleeve first. Gentle passive stretch to digits followed by grasp/ release of wooden dowel pegs while wear splint and without, min-mod facilitation/ v.c. Thumb spica portion appears to assist with grasp. Therapist then assessed the pan / resting splint portion. It appears to fit well and pt was encouraged to wear for 30 mins- 1 hour daily when watching TV.   12/30/22-sidelying on mat, therapist performed scapular mobs, followed by supine, pt self ROM shoulder flexion with therapist facilitating scapula. Seated edge of mat weightbearing through left elbow over ball with body on arm movements and lateral trunk flexion, followed by thread the needle min facilitation/ v.c, (Pt repeated with elbow supported on 2 pillows) Prone on elbows, lifting chest for weightbearing and scapular activation, min facilitation/ v.c. Quadraped weightbearing through physio ball on elbows rolling forwards and backwards for shoulder flexion and weightbearing, min facilitation P/ROM forearm supination, then P/ROM finger and thumb extension Self stretch reach for the floor, min v.c Functional grasp/ release of 1 inch blocks to place in container, mod facilitation/ v.c  12/29/22- Seated edge of mat weightbearing through left elbow over ball with body on arm movements and lateral trunk flexion, min-mod facilitation/ v.c Standing edge of mat weightbearing through physio ball on mat for shoulder flexion and weightbearing, min facilitation P/ROM forearm supination, then pt performed on herself with min v.c and demonstration P/ROM finger and thumb extension Functional grasp/  release of wooden dowel pegs to remove from pegboard then to replace with min-mod facilitation/ v.c, mod difficulty. Pt simulated washing her legs with LUE using hand over hand assist with washcloth.       12/17/22- eval only   PATIENT EDUCATION: Education details:  see above Person educated: Patient and caregiver Education method: Explanation, demonstration, v.c,  Education comprehension: verbalized understanding, returned demonstration  HOME EXERCISE PROGRAM: Weightbearing and supine shoulder flexion   GOALS: Goals reviewed with patient? Yes  SHORT TERM GOALS:  Target date: 01/16/23  I with initial HEP.  Goal status:  met, pt demonstrates understanding 01/19/23  2.  Pt will report using LUE as a gross assist at least 10% of the time for ADLs.   Goal status: ongoing 01/12/23  3.  Pt will demonstrate ability to pick up a 1 inch block with LUE 2/5 trials consistently.  Goal status:  ongoing 01/12/23  4.  I with positioning to minimize contracture including splinting prn Baseline:  Goal status: met, pt is wearing her splint with resting pan portion daily 01/14/23  5.  Pt will increase LUE A/ROM shoulder flexion to 110* for increased ease with ADLs. Baseline:  Goal status: ongoing, 01/12/23  6.    LONG TERM GOALS: Target date: 03/11/23  I with updated HEP.  Goal status: INITIAL  2.  Pt will report increased ease with fastening jeans, opening water bottles and donning/ doffing earrings.  Goal status: INITIAL  3.  Pt will use LUE to assist with brushing her hair. Baseline:  Goal status: INITIAL  4.   Pt will report using LUE as a gross / active assist at least 15% of the time for ADLs.  Goal status: INITIAL  5.  Pt will demonstrate -30 elbow extension in prep for functional reach.  Goal status: INITIAL  6. Assess for vivistim prn Baseline: inital    ASSESSMENT:  CLINICAL IMPRESSION: Patient is progressing towards goals. Pt continues to use LUE for  functional grasp/ release at home. PERFORMANCE DEFICITS: in functional skills including ADLs, IADLs, coordination, dexterity, proprioception, sensation, tone, ROM, strength, flexibility, Fine motor control, Gross motor control, mobility, balance, endurance, decreased knowledge of precautions, decreased knowledge of use of DME, vision, and UE functional use, cognitive skills including memory and thought, and psychosocial skills including coping strategies, environmental adaptation, habits, interpersonal interactions, and routines and behaviors.   IMPAIRMENTS: are limiting patient from ADLs, IADLs, work, play, leisure, and social participation.   CO-MORBIDITIES: may have co-morbidities  that affects occupational performance. Patient will benefit from skilled OT to address above impairments and improve overall function.  MODIFICATION OR ASSISTANCE TO COMPLETE EVALUATION: No modification of tasks or assist necessary to complete an evaluation.  OT OCCUPATIONAL PROFILE AND HISTORY: Detailed assessment: Review of records and additional review of physical, cognitive, psychosocial history related to current functional performance.  CLINICAL DECISION MAKING: LOW - limited treatment options, no task modification necessary  REHAB POTENTIAL: Good  EVALUATION COMPLEXITY: Low    PLAN:  OT FREQUENCY: 2x/week plus eval  OT DURATION: 12 weeks  PLANNED INTERVENTIONS: self care/ADL training, therapeutic exercise, therapeutic activity, neuromuscular re-education, manual therapy, passive range of motion, functional mobility training, splinting, electrical stimulation, ultrasound, paraffin, moist heat, cryotherapy, contrast bath, patient/family education, cognitive remediation/compensation, visual/perceptual remediation/compensation, energy conservation, coping strategies training, DME and/or AE instructions, and Re-evaluation  RECOMMENDED OTHER SERVICES: PT  CONSULTED AND AGREED WITH PLAN OF CARE: Patient and  family member/caregiver  PLAN FOR NEXT SESSION: check short term goals next visit.   Sansa Alkema, OT 01/21/2023, 2:44 PM

## 2023-01-28 ENCOUNTER — Ambulatory Visit: Payer: BC Managed Care – PPO | Admitting: Occupational Therapy

## 2023-01-28 DIAGNOSIS — R2689 Other abnormalities of gait and mobility: Secondary | ICD-10-CM

## 2023-01-28 DIAGNOSIS — R2681 Unsteadiness on feet: Secondary | ICD-10-CM

## 2023-01-28 DIAGNOSIS — M6281 Muscle weakness (generalized): Secondary | ICD-10-CM

## 2023-01-28 DIAGNOSIS — R293 Abnormal posture: Secondary | ICD-10-CM

## 2023-01-28 DIAGNOSIS — R278 Other lack of coordination: Secondary | ICD-10-CM

## 2023-01-28 DIAGNOSIS — I69954 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: Secondary | ICD-10-CM

## 2023-01-28 NOTE — Therapy (Signed)
- OUTPATIENT OCCUPATIONAL THERAPY NEURO  treatment  Patient Name: Asenat Ezekiel MRN: [id number] DOB:[birth year], 22 y.o., female Today's Date: 01/28/2023  PCP: none REFERRING PROVIDER: Dr. Reola Calkins  END OF SESSION:  OT End of Session - 01/28/23 1444     Visit Number 9    Number of Visits 25    Date for OT Re-Evaluation 03/11/23    Authorization Type BCBS/Medicaid    Authorization Time Period 12 weeks    OT Start Time 1445    OT Stop Time 1520    OT Time Calculation (min) 35 min    Activity Tolerance Patient tolerated treatment well    Behavior During Therapy WFL for tasks assessed/performed                Past Medical History:  Diagnosis Date   Allergy    Cerebral palsy ([name])    CP (cerebral palsy) ([name])    Stroke [facility])    Past Surgical History:  Procedure Laterality Date   ARM SURGERY     2013   FOOT SURGERY     2010   HAMSTRING LENGTHENING     TIBIA / FIBIA LENGTHENING     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   Balance problem 05/06/2018   Syncope 05/06/2018   History of cerebrovascular accident 11/18/2017   Anxiety state 06/25/2017   Spasticity 06/25/2017   Allergic rhinitis 10/21/2016   Mild intermittent asthma 10/21/2016    ONSET DATE: 12/01/22  REFERRING DIAG: G80.2 (ICD-10-CM) - Spastic hemiplegic cerebral palsy ([name])  THERAPY DIAG:  Muscle weakness (generalized)  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)  Other lack of coordination  Abnormal posture  Unsteadiness on feet  Other abnormalities of  gait and mobility  Rationale for Evaluation and Treatment: Rehabilitation  SUBJECTIVE:   SUBJECTIVE STATEMENT: Pt reports she has voice lessons today, she needs to leave early  PERTINENT HISTORY: 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 ACZ:YSAYTKZ of multiple surgeries, particularly on the upper and lower extremities, generalized weakness, more pronounced on the left side, and a contracture in the left arm   PRECAUTIONS: Fall  WEIGHT BEARING RESTRICTIONS: No  PAIN:  Are you having pain? No  FALLS: Has patient fallen in last 6 months? Yes.   LIVING ENVIRONMENT: Lives with: lives with their family Lives in: House/apartment   PLOF: Needs assistance with ADLs and Needs assistance with homemaking  PATIENT GOALS: increase LUE functional use  OBJECTIVE:   HAND DOMINANCE: Right  ADLs:Pt reports difficulty with the following tasks; button jeans, donn/ doff earrings, open water bottles Pt/ mom report she uses LUE only 5% of the time. Overall ADLs: increased time required Transfers/ambulation related to ADLs:mod I with hx of falls Eating: needs assist with cutting food , difficulty with spreading cream cheese Grooming: needs assist with brushing and putty hair in a pony tail difficulty opening mascara and lip gloss UB Dressing: mod I  LB Dressing: needs assist  with tying and getting shoes on Toileting: mod I Bathing: needs assist with washing hair, difficulty washing R side Tub  Shower transfers:  has sliding tub bench , supervision Equipment: Transfer tub bench  IADLs: Shopping: need assist for  Light housekeeping: does laundry , cleans bathroom, dusts  Meal Prep: pt makes mac and cheese, garlic  bread in frying pan  however pt needs tortilla bag Community mobility: mod I Medication management: needs reminders, pt sorts  R handed   MOBILITY STATUS: Independent    UPPER EXTREMITY ROM:    Active ROM Right eval Left eval  Shoulder flexion   105  Shoulder abduction    Shoulder adduction    Shoulder extension    Shoulder internal rotation    Shoulder external rotation    Elbow flexion  125  Elbow extension  -35  Wrist flexion  -65 at rest  Wrist extension  -65/-15 P/ROM  Wrist ulnar deviation    Wrist radial deviation    Wrist pronation    Wrist supination  Unable/ 75% P/ROM  (Blank rows = not tested)  HAND FUNCTION: Grip strength: Right: NT lbs; Left: NT lbs  COORDINATION: LUE is impaired pt was able to pick up a block 1/3 trials to place in container  SENSATION: Not tested    MUSCLE TONE: LUE: Mild and Hypertonic  COGNITION: Overall cognitive status:  decreased short term memory per PT report    VISION ASSESSMENT: Not tested     OBSERVATIONS: Pt is motivated  and pleasant .   TODAY'S TREATMENT:                                                                                                                              DATE:01/28/23- Supine on mat, gentle passive stretch to LUE in abduction, and shoulder flexion, while facilitating scapula.Supine on mat holding PVC pipe, pt performed chest press, and shoulder flexion with min-mod facilitation/ v.c for left arm and scapula positioning while wearing thumb spica (benke)splint  Therapist checked progress towards short term goals. Pt was able to grasp and pick up/ release a 1 inch block today, 4/5 trials with stretching in between. Functional grasp/ release of wooden dowel pegs to place on target, mod difficulty/ v.c Note sent to patients mother regarding potential custom resting splint, plans for the Fugl-meyer and potential break from therapy in October   01/21/23- supine on mat passive stretch to LUE in abduction and elbow extension, while pt performed lower trunk rotation. Seated edge of mat weightbearing through left elbow over ball with body on arm movements and lateral trunk flexion, and performing diagonals with RUE, min-mod facilitation/ v.c Supine  on mat holding PVC pipe, pt performed chest press, and shoulder flexion with min-mod facilitation/ v.c for left arm and scapula positioning while wearing thumb spica (benke)splint  Self stretch elbow extension reaching for the floor. Standing at table wearing splint for weightbearing through ball for circumduction and rolling forwards and back with bilateral UE's, mood facilitation. Drumming with LUE to hit target mod facilitation for LUE forearm rotation, elbow flexion/ extension.  Ar, bike x 3 mins with hand wrapped mod facilitation Pt requests to leave early for voice class.   01/19/23-Seated on mat gentle passive stretch to left forearm in supination, and digits in extension.  Pt performed self stretch shoulder flexion reaching for floor, min v.c Seated edge of mat weightbearing through left elbow over ball with body on arm movements and lateral trunk flexion, and performing diagonals with RUE, min-mod facilitation/ v.c Supine on mat holding PVC pipe, pt performed chest press, and shoulder flexion with min-mod facilitation/ v.c for left arm and scapula positioning while wearing thumb spica (benke)splint  Pt's reports she has not attempted at home. Seated at table pt performed shoulder flexion and abduction table slides , 10 reps each min v.c  Functional grasp/ release of wooden dowel pegs while wearing splint, min-mod facilitation/ v.c, to remove and replace pegs with emphasis on more normal movement patterns while wearing splint. Pt reports improving ability to use her left hand for grasp/ release.  01/14/23 Seated edge of mat weightbearing through left elbow over ball with body on arm movements and lateral trunk flexion, and performing diagonals with RUE, min-mod facilitation/ v.c Pt's thumb spica portion of the Beneke splint was applied then pt performed the following exercise. Supine on mat holding PVC pipe, pt performed chest press, and shoulder flexion with min-mod facilitation/ v.c. Pt's  caregiver took a video so that pt can perform at home. Pt/ caregiver verbalize understanding Seated at table pt performed shoulder flexion and abduction table slides min v.c  Functional grasp/ release of wooden dowel pegs while wearing splint, min-mod facilitation/ v.c Pt practiced using bilateral UE's to open an water bottle with mod facilitation.Pt requested to leave early for voice lessons.   01/12/23-Pt reports wearing splint 30 mins-1 hour at home without issues.Seated edge of mat weightbearing through left elbow over foam roll, with body on arm movements and lateral trunk flexion, min-mod facilitation/ v.c Supine, self ROM shoulder flexion with therapist facilitating scapula, min facilitation/ v.c Supine, passive shoulder abduction stretch with lower trunk rotation, min v.c and mod facilitation. Wearing thumb portion of Beneke splint, pt performed activities to increase functional use of LUE: functional grasp/ release of cones, picking up 1 inch blocks to place in container, and folding wash cloths, mod facilitation/ v.c passive stretch to forearm and digits prior to task. 01/07/23 Pt brought in her previously issued pre-fab splints which she has not been wearing consistently. Pt donned the thumb spica sleeve first. Gentle passive stretch to digits followed by grasp/ release of wooden dowel pegs while wear splint and without, min-mod facilitation/ v.c. Thumb spica portion appears to assist with grasp. Therapist then assessed the pan / resting splint portion. It appears to fit well and pt was encouraged to wear for 30 mins- 1 hour daily when watching TV.   12/30/22-sidelying on mat, therapist performed scapular mobs, followed by supine, pt self ROM shoulder flexion with therapist facilitating scapula. Seated edge of mat weightbearing through left elbow over ball with body on arm movements and lateral trunk flexion, followed by thread the needle min facilitation/ v.c, (Pt repeated with elbow supported  on 2 pillows) Prone on elbows, lifting chest for weightbearing and scapular activation, min facilitation/ v.c. Quadraped weightbearing through physio ball on elbows rolling forwards and backwards for shoulder flexion and weightbearing, min facilitation P/ROM forearm supination, then P/ROM finger and thumb extension Self stretch reach for the floor, min v.c Functional grasp/ release of 1 inch blocks to place in container, mod facilitation/  v.c  12/29/22- Seated edge of mat weightbearing through left elbow over ball with body on arm movements and lateral trunk flexion, min-mod facilitation/ v.c Standing edge of mat weightbearing through physio ball on mat for shoulder flexion and weightbearing, min facilitation P/ROM forearm supination, then pt performed on herself with min v.c and demonstration P/ROM finger and thumb extension Functional grasp/ release of wooden dowel pegs to remove from pegboard then to replace with min-mod facilitation/ v.c, mod difficulty. Pt simulated washing her legs with LUE using hand over hand assist with washcloth.       12/17/22- eval only   PATIENT EDUCATION: Education details:  see above Person educated: Patient and caregiver Education method: Explanation, demonstration, v.c,  Education comprehension: verbalized understanding, returned demonstration  HOME EXERCISE PROGRAM: Weightbearing and supine shoulder flexion   GOALS: Goals reviewed with patient? Yes  SHORT TERM GOALS: Target date: 01/16/23  I with initial HEP.  Goal status:  met, pt demonstrates understanding 01/19/23  2.  Pt will report using LUE as a gross assist at least 10% of the time for ADLs.   Goal status: ongoing 01/12/23  3.  Pt will demonstrate ability to pick up a 1 inch block with LUE 2/5 trials consistently.  Goal status:  met, 01/28/23 today 4/5 trials, with stretching in between trials  4.  I with positioning to minimize contracture including splinting prn  Goal status: met,  pt is wearing her splint with resting pan portion daily 01/14/23  5.  Pt will increase LUE A/ROM shoulder flexion to 110* for increased ease with ADLs. Baseline: 105 Goal status: partially met 110 with mod compensation  01/28/23  6.    LONG TERM GOALS: Target date: 03/11/23  I with updated HEP.  Goal status: INITIAL  2.  Pt will report increased ease with fastening jeans, opening water bottles and donning/ doffing earrings.  Goal status: INITIAL  3.  Pt will use LUE to assist with brushing her hair. Baseline:  Goal status: INITIAL  4.   Pt will report using LUE as a gross / active assist at least 15% of the time for ADLs.  Goal status: INITIAL  5.  Pt will demonstrate -30 elbow extension in prep for functional reach.  Goal status: INITIAL  6. Assess for vivistim prn Baseline: inital    ASSESSMENT:  CLINICAL IMPRESSION: Patient is progressing towards goals. She demonstrates improving  functional grasp/ release. PERFORMANCE DEFICITS: in functional skills including ADLs, IADLs, coordination, dexterity, proprioception, sensation, tone, ROM, strength, flexibility, Fine motor control, Gross motor control, mobility, balance, endurance, decreased knowledge of precautions, decreased knowledge of use of DME, vision, and UE functional use, cognitive skills including memory and thought, and psychosocial skills including coping strategies, environmental adaptation, habits, interpersonal interactions, and routines and behaviors.   IMPAIRMENTS: are limiting patient from ADLs, IADLs, work, play, leisure, and social participation.   CO-MORBIDITIES: may have co-morbidities  that affects occupational performance. Patient will benefit from skilled OT to address above impairments and improve overall function.  MODIFICATION OR ASSISTANCE TO COMPLETE EVALUATION: No modification of tasks or assist necessary to complete an evaluation.  OT OCCUPATIONAL PROFILE AND HISTORY: Detailed assessment:  Review of records and additional review of physical, cognitive, psychosocial history related to current functional performance.  CLINICAL DECISION MAKING: LOW - limited treatment options, no task modification necessary  REHAB POTENTIAL: Good  EVALUATION COMPLEXITY: Low    PLAN:  OT FREQUENCY: 2x/week plus eval  OT DURATION: 12 weeks  PLANNED INTERVENTIONS: self care/ADL  training, therapeutic exercise, therapeutic activity, neuromuscular re-education, manual therapy, passive range of motion, functional mobility training, splinting, electrical stimulation, ultrasound, paraffin, moist heat, cryotherapy, contrast bath, patient/family education, cognitive remediation/compensation, visual/perceptual remediation/compensation, energy conservation, coping strategies training, DME and/or AE instructions, and Re-evaluation  RECOMMENDED OTHER SERVICES: PT  CONSULTED AND AGREED WITH PLAN OF CARE: Patient and family member/caregiver  PLAN FOR NEXT SESSION:  functional use of LUE, potential splint   Yoshua Geisinger, OT 01/28/2023, 4:00 PM

## 2023-01-28 NOTE — Patient Instructions (Signed)
Dear Kerri Robertson,  I feel like Kerri Robertson may benefit from a custom resting hand splint to provide greater stretch to the digits and thumb.  Kerri Robertson is open to giving it a try, please let me know if this is ok with you.  Would you like for me to perform the Fugl-Meyer on Kerri Robertson? It's the screening tool for the vivistim.  She is scheduled through mid October, are we thinking about taking a break at that time?  That will probably be close to 8 weeks of therapy.  Thanks, Samara Deist

## 2023-02-02 ENCOUNTER — Encounter: Payer: Self-pay | Admitting: Occupational Therapy

## 2023-02-02 ENCOUNTER — Ambulatory Visit: Payer: BC Managed Care – PPO | Admitting: Occupational Therapy

## 2023-02-02 DIAGNOSIS — R2681 Unsteadiness on feet: Secondary | ICD-10-CM

## 2023-02-02 DIAGNOSIS — R278 Other lack of coordination: Secondary | ICD-10-CM

## 2023-02-02 DIAGNOSIS — R2689 Other abnormalities of gait and mobility: Secondary | ICD-10-CM

## 2023-02-02 DIAGNOSIS — R293 Abnormal posture: Secondary | ICD-10-CM

## 2023-02-02 DIAGNOSIS — M6281 Muscle weakness (generalized): Secondary | ICD-10-CM | POA: Diagnosis not present

## 2023-02-02 DIAGNOSIS — I69954 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: Secondary | ICD-10-CM

## 2023-02-02 NOTE — Patient Instructions (Signed)
Good morning Kelli,  We started fabrication of a custom resting hand splint. Her previous splint does not appear to stretch the thumb much. We will complete the splint then consider the Fugl-Meyer if she is doing well. I feel as though her ROM and light functional use has improved since starting therapy.  Thanks, [employer], OTR/L

## 2023-02-02 NOTE — Therapy (Signed)
- OUTPATIENT OCCUPATIONAL THERAPY NEURO  treatment  Patient Name: Kerri Robertson MRN: [id number] DOB:11-22-2000, 22 y.o., female Today's Date: 02/02/2023  PCP: none REFERRING PROVIDER: Dr. Reola Calkins  END OF SESSION:  OT End of Session - 02/02/23 1221     Visit Number 10    Number of Visits 25    Date for OT Re-Evaluation 03/11/23    Authorization Type BCBS/Medicaid    Authorization Time Period 12 weeks    OT Start Time 0853    OT Stop Time 0932    OT Time Calculation (min) 39 min    Activity Tolerance Patient tolerated treatment well    Behavior During Therapy WFL for tasks assessed/performed                 Past Medical History:  Diagnosis Date   Allergy    Cerebral palsy ([name])    CP (cerebral palsy) ([name])    Stroke [facility])    Past Surgical History:  Procedure Laterality Date   ARM SURGERY     2013   FOOT SURGERY     2010   HAMSTRING LENGTHENING     TIBIA / FIBIA LENGTHENING     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   Balance problem 05/06/2018   Syncope 05/06/2018   History of cerebrovascular accident 11/18/2017   Anxiety state 06/25/2017   Spasticity 06/25/2017   Allergic rhinitis 10/21/2016   Mild intermittent asthma 10/21/2016    ONSET DATE: 12/01/22  REFERRING DIAG: G80.2 (ICD-10-CM) - Spastic hemiplegic cerebral palsy ([name])  THERAPY DIAG:  Muscle weakness (generalized)  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)  Other lack of coordination  Abnormal posture  Unsteadiness on feet  Other abnormalities  of gait and mobility  Rationale for Evaluation and Treatment: Rehabilitation  SUBJECTIVE:   SUBJECTIVE STATEMENT: Pt denies pain  PERTINENT HISTORY: 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 YQM:VHQIONG of multiple surgeries, particularly on the upper and lower extremities, generalized weakness, more pronounced on the left side, and a contracture in the left arm   PRECAUTIONS: Fall  WEIGHT BEARING RESTRICTIONS: No  PAIN:  Are you having pain? No  FALLS: Has patient fallen in last 6 months? Yes.   LIVING ENVIRONMENT: Lives with: lives with their family Lives in: House/apartment   PLOF: Needs assistance with ADLs and Needs assistance with homemaking  PATIENT GOALS: increase LUE functional use  OBJECTIVE:   HAND DOMINANCE: Right  ADLs:Pt reports difficulty with the following tasks; button jeans, donn/ doff earrings, open water bottles Pt/ mom report she uses LUE only 5% of the time. Overall ADLs: increased time required Transfers/ambulation related to ADLs:mod I with hx of falls Eating: needs assist with cutting food , difficulty with spreading cream cheese Grooming: needs assist with brushing and putty hair in a pony tail difficulty opening mascara and lip gloss UB Dressing: mod I  LB Dressing: needs assist  with tying and getting shoes on Toileting: mod I Bathing: needs assist with washing hair, difficulty washing R side Tub Shower transfers:  has sliding tub bench ,  supervision Equipment: Transfer tub bench  IADLs: Shopping: need assist for  Light housekeeping: does laundry , cleans bathroom, dusts  Meal Prep: pt makes mac and cheese, garlic  bread in frying pan  however pt needs tortilla bag Community mobility: mod I Medication management: needs reminders, pt sorts  R handed   MOBILITY STATUS: Independent    UPPER EXTREMITY ROM:    Active ROM Right eval Left eval  Shoulder flexion  105  Shoulder abduction    Shoulder adduction     Shoulder extension    Shoulder internal rotation    Shoulder external rotation    Elbow flexion  125  Elbow extension  -35  Wrist flexion  -65 at rest  Wrist extension  -65/-15 P/ROM  Wrist ulnar deviation    Wrist radial deviation    Wrist pronation    Wrist supination  Unable/ 75% P/ROM  (Blank rows = not tested)  HAND FUNCTION: Grip strength: Right: NT lbs; Left: NT lbs  COORDINATION: LUE is impaired pt was able to pick up a block 1/3 trials to place in container  SENSATION: Not tested    MUSCLE TONE: LUE: Mild and Hypertonic  COGNITION: Overall cognitive status:  decreased short term memory per PT report    VISION ASSESSMENT: Not tested     OBSERVATIONS: Pt is motivated  and pleasant .   TODAY'S TREATMENT:                                                                                                                              DATE:02/02/23-Pt performed tableslides for gentle stretch for shoulder flexion abduction. Functional grasp/ release of wooden dowel pegs to remove and replace in pegboard, min-mod difficulty.Therapist started fabrication of a custom resting hand splint for improved stretch to left thumb. Splint was not issued due to time constraints.  01/28/23- Supine on mat, gentle passive stretch to LUE in abduction, and shoulder flexion, while facilitating scapula.Supine on mat holding PVC pipe, pt performed chest press, and shoulder flexion with min-mod facilitation/ v.c for left arm and scapula positioning while wearing thumb spica (benke)splint  Therapist checked progress towards short term goals. Pt was able to grasp and pick up/ release a 1 inch block today, 4/5 trials with stretching in between. Functional grasp/ release of wooden dowel pegs to place on target, mod difficulty/ v.c Note sent to patients mother regarding potential custom resting splint, plans for the [employer]l[place] and potential break from therapy in October   01/21/23- supine on mat  passive stretch to LUE in abduction and elbow extension, while pt performed lower trunk rotation. Seated edge of mat weightbearing through left elbow over ball with body on arm movements and lateral trunk flexion, and performing diagonals with RUE, min-mod facilitation/ v.c Supine on mat holding PVC pipe, pt performed chest press, and shoulder flexion with min-mod facilitation/ v.c for left arm and scapula positioning while wearing thumb spica (benke)splint  Self stretch elbow extension  reaching for the floor. Standing at table wearing splint for weightbearing through ball for circumduction and rolling forwards and back with bilateral UE's, mood facilitation. Drumming with LUE to hit target mod facilitation for LUE forearm rotation, elbow flexion/ extension. Ar, bike x 3 mins with hand wrapped mod facilitation Pt requests to leave early for voice class.   01/19/23-Seated on mat gentle passive stretch to left forearm in supination, and digits in extension.  Pt performed self stretch shoulder flexion reaching for floor, min v.c Seated edge of mat weightbearing through left elbow over ball with body on arm movements and lateral trunk flexion, and performing diagonals with RUE, min-mod facilitation/ v.c Supine on mat holding PVC pipe, pt performed chest press, and shoulder flexion with min-mod facilitation/ v.c for left arm and scapula positioning while wearing thumb spica (benke)splint  Pt's reports she has not attempted at home. Seated at table pt performed shoulder flexion and abduction table slides , 10 reps each min v.c  Functional grasp/ release of wooden dowel pegs while wearing splint, min-mod facilitation/ v.c, to remove and replace pegs with emphasis on more normal movement patterns while wearing splint. Pt reports improving ability to use her left hand for grasp/ release.  01/14/23 Seated edge of mat weightbearing through left elbow over ball with body on arm movements and lateral trunk  flexion, and performing diagonals with RUE, min-mod facilitation/ v.c Pt's thumb spica portion of the Beneke splint was applied then pt performed the following exercise. Supine on mat holding PVC pipe, pt performed chest press, and shoulder flexion with min-mod facilitation/ v.c. Pt's caregiver took a video so that pt can perform at home. Pt/ caregiver verbalize understanding Seated at table pt performed shoulder flexion and abduction table slides min v.c  Functional grasp/ release of wooden dowel pegs while wearing splint, min-mod facilitation/ v.c Pt practiced using bilateral UE's to open an water bottle with mod facilitation.Pt requested to leave early for voice lessons.   01/12/23-Pt reports wearing splint 30 mins-1 hour at home without issues.Seated edge of mat weightbearing through left elbow over foam roll, with body on arm movements and lateral trunk flexion, min-mod facilitation/ v.c Supine, self ROM shoulder flexion with therapist facilitating scapula, min facilitation/ v.c Supine, passive shoulder abduction stretch with lower trunk rotation, min v.c and mod facilitation. Wearing thumb portion of Beneke splint, pt performed activities to increase functional use of LUE: functional grasp/ release of cones, picking up 1 inch blocks to place in container, and folding wash cloths, mod facilitation/ v.c passive stretch to forearm and digits prior to task. 01/07/23 Pt brought in her previously issued pre-fab splints which she has not been wearing consistently. Pt donned the thumb spica sleeve first. Gentle passive stretch to digits followed by grasp/ release of wooden dowel pegs while wear splint and without, min-mod facilitation/ v.c. Thumb spica portion appears to assist with grasp. Therapist then assessed the pan / resting splint portion. It appears to fit well and pt was encouraged to wear for 30 mins- 1 hour daily when watching TV.   12/30/22-sidelying on mat, therapist performed scapular mobs,  followed by supine, pt self ROM shoulder flexion with therapist facilitating scapula. Seated edge of mat weightbearing through left elbow over ball with body on arm movements and lateral trunk flexion, followed by thread the needle min facilitation/ v.c, (Pt repeated with elbow supported on 2 pillows) Prone on elbows, lifting chest for weightbearing and scapular activation, min facilitation/ v.c. Quadraped weightbearing through physio ball on elbows rolling  forwards and backwards for shoulder flexion and weightbearing, min facilitation P/ROM forearm supination, then P/ROM finger and thumb extension Self stretch reach for the floor, min v.c Functional grasp/ release of 1 inch blocks to place in container, mod facilitation/ v.c  12/29/22- Seated edge of mat weightbearing through left elbow over ball with body on arm movements and lateral trunk flexion, min-mod facilitation/ v.c Standing edge of mat weightbearing through physio ball on mat for shoulder flexion and weightbearing, min facilitation P/ROM forearm supination, then pt performed on herself with min v.c and demonstration P/ROM finger and thumb extension Functional grasp/ release of wooden dowel pegs to remove from pegboard then to replace with min-mod facilitation/ v.c, mod difficulty. Pt simulated washing her legs with LUE using hand over hand assist with washcloth.       12/17/22- eval only   PATIENT EDUCATION: Education details:  see above Person educated: Patient and caregiver Education method: Explanation, demonstration, v.c,  Education comprehension: verbalized understanding, returned demonstration  HOME EXERCISE PROGRAM: Weightbearing and supine shoulder flexion   GOALS: Goals reviewed with patient? Yes  SHORT TERM GOALS: Target date: 01/16/23  I with initial HEP.  Goal status:  met, pt demonstrates understanding 01/19/23  2.  Pt will report using LUE as a gross assist at least 10% of the time for ADLs.   Goal  status: ongoing 01/12/23  3.  Pt will demonstrate ability to pick up a 1 inch block with LUE 2/5 trials consistently.  Goal status:  met, 01/28/23 today 4/5 trials, with stretching in between trials  4.  I with positioning to minimize contracture including splinting prn  Goal status: met, pt is wearing her splint with resting pan portion daily 01/14/23  5.  Pt will increase LUE A/ROM shoulder flexion to 110* for increased ease with ADLs. Baseline: 105 Goal status: partially met 110 with mod compensation  01/28/23  6.    LONG TERM GOALS: Target date: 03/11/23  I with updated HEP.  Goal status: INITIAL  2.  Pt will report increased ease with fastening jeans, opening water bottles and donning/ doffing earrings.  Goal status: INITIAL  3.  Pt will use LUE to assist with brushing her hair. Baseline:  Goal status: INITIAL  4.   Pt will report using LUE as a gross / active assist at least 15% of the time for ADLs.  Goal status: INITIAL  5.  Pt will demonstrate -30 elbow extension in prep for functional reach.  Goal status: INITIAL  6. Assess for vivistim prn Baseline: inital    ASSESSMENT:  CLINICAL IMPRESSION: Patient is progressing towards goals. Note sent with patient to communicate with pt's mom. Pt continues to demonstrate improving simple functional use of LUE. PERFORMANCE DEFICITS: in functional skills including ADLs, IADLs, coordination, dexterity, proprioception, sensation, tone, ROM, strength, flexibility, Fine motor control, Gross motor control, mobility, balance, endurance, decreased knowledge of precautions, decreased knowledge of use of DME, vision, and UE functional use, cognitive skills including memory and thought, and psychosocial skills including coping strategies, environmental adaptation, habits, interpersonal interactions, and routines and behaviors.   IMPAIRMENTS: are limiting patient from ADLs, IADLs, work, play, leisure, and social participation.    CO-MORBIDITIES: may have co-morbidities  that affects occupational performance. Patient will benefit from skilled OT to address above impairments and improve overall function.  MODIFICATION OR ASSISTANCE TO COMPLETE EVALUATION: No modification of tasks or assist necessary to complete an evaluation.  OT OCCUPATIONAL PROFILE AND HISTORY: Detailed assessment: Review of records and additional  review of physical, cognitive, psychosocial history related to current functional performance.  CLINICAL DECISION MAKING: LOW - limited treatment options, no task modification necessary  REHAB POTENTIAL: Good  EVALUATION COMPLEXITY: Low    PLAN:  OT FREQUENCY: 2x/week plus eval  OT DURATION: 12 weeks  PLANNED INTERVENTIONS: self care/ADL training, therapeutic exercise, therapeutic activity, neuromuscular re-education, manual therapy, passive range of motion, functional mobility training, splinting, electrical stimulation, ultrasound, paraffin, moist heat, cryotherapy, contrast bath, patient/family education, cognitive remediation/compensation, visual/perceptual remediation/compensation, energy conservation, coping strategies training, DME and/or AE instructions, and Re-evaluation  RECOMMENDED OTHER SERVICES: PT  CONSULTED AND AGREED WITH PLAN OF CARE: Patient and family member/caregiver  PLAN FOR NEXT SESSION:  complete splint and issue next visit Airyonna Franklyn, OT 02/02/2023, 12:24 PM

## 2023-02-03 ENCOUNTER — Ambulatory Visit: Payer: BC Managed Care – PPO | Attending: Neurology | Admitting: Occupational Therapy

## 2023-02-03 DIAGNOSIS — R2681 Unsteadiness on feet: Secondary | ICD-10-CM | POA: Insufficient documentation

## 2023-02-03 DIAGNOSIS — R2689 Other abnormalities of gait and mobility: Secondary | ICD-10-CM | POA: Insufficient documentation

## 2023-02-03 DIAGNOSIS — M6281 Muscle weakness (generalized): Secondary | ICD-10-CM | POA: Diagnosis present

## 2023-02-03 DIAGNOSIS — R278 Other lack of coordination: Secondary | ICD-10-CM | POA: Diagnosis present

## 2023-02-03 DIAGNOSIS — I69954 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: Secondary | ICD-10-CM | POA: Insufficient documentation

## 2023-02-03 DIAGNOSIS — R293 Abnormal posture: Secondary | ICD-10-CM | POA: Insufficient documentation

## 2023-02-03 NOTE — Patient Instructions (Signed)
Your Splint This splint should initially be fitted by a healthcare practitioner.  The healthcare practitioner is responsible for providing wearing instructions and precautions to the patient, other healthcare practitioners and care provider involved in the patient's care.  This splint was custom made for you. Please read the following instructions to learn about wearing and caring for your splint.  Precautions Should your splint cause any of the following problems, remove the splint immediately and contact your therapist/physician. Swelling Severe Pain Pressure Areas Stiffness Numbness   When To Wear Your Splint Where your splint according to your therapist/physician instructions. Daytime for 30 mins to 1 hour initally then check your skin for pressure areas or redness. If no problems you can wear for 1-2 hours at a time while awake but relaxing. Remove splint if any problems and bring to next therapy visit.  Care and Cleaning of Your Splint Keep your splint away from open flames. Your splint will lose its shape in temperatures over 135 degrees Farenheit, ( in car windows, near radiators, ovens or in hot water).  Never make any adjustments to your splint, if the splint needs adjusting remove it and make an appointment to see your therapist. Your splint, including the cushion liner may be cleaned with soap and lukewarm water.  Do not immerse in hot water over 135 degrees Farenheit. Straps may be washed with soap and water, but do not moisten the self-adhesive portion.

## 2023-02-03 NOTE — Therapy (Signed)
- OUTPATIENT OCCUPATIONAL THERAPY NEURO  treatment  Patient Name: Kerri Robertson MRN: [id number] DOB:[birth year], 22 y.o., female Today's Date: 02/03/2023  PCP: none REFERRING PROVIDER: Dr. Reola Calkins  END OF SESSION:  OT End of Session - 02/03/23 1621     Visit Number 11    Number of Visits 25    Date for OT Re-Evaluation 03/11/23    Authorization Type BCBS/Medicaid    Authorization Time Period 12 weeks    OT Start Time 1515    OT Stop Time 1603    OT Time Calculation (min) 48 min                 Past Medical History:  Diagnosis Date   Allergy    Cerebral palsy ([name])    CP (cerebral palsy) ([name])    Stroke ([name])    Past Surgical History:  Procedure Laterality Date   ARM SURGERY     2013   FOOT SURGERY     2010   HAMSTRING LENGTHENING     TIBIA / FIBIA LENGTHENING     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   Balance problem 05/06/2018   Syncope 05/06/2018   History of cerebrovascular accident 11/18/2017   Anxiety state 06/25/2017   Spasticity 06/25/2017   Allergic rhinitis 10/21/2016   Mild intermittent asthma 10/21/2016    ONSET DATE: 12/01/22  REFERRING DIAG: G80.2 (ICD-10-CM) - Spastic hemiplegic cerebral palsy ([name])  THERAPY DIAG:  Muscle weakness (generalized)  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)  Other lack of coordination  Abnormal posture  Unsteadiness on feet  Other abnormalities of gait and mobility  Rationale for Evaluation and Treatment: Rehabilitation  SUBJECTIVE:   SUBJECTIVE  STATEMENT: Pt denies pain  PERTINENT HISTORY: 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 NWG:NFAOZHY of multiple surgeries, particularly on the upper and lower extremities, generalized weakness, more pronounced on the left side, and a contracture in the left arm   PRECAUTIONS: Fall  WEIGHT BEARING RESTRICTIONS: No  PAIN:  Are you having pain? No  FALLS: Has patient fallen in last 6 months? Yes.   LIVING ENVIRONMENT: Lives with: lives with their family Lives in: House/apartment   PLOF: Needs assistance with ADLs and Needs assistance with homemaking  PATIENT GOALS: increase LUE functional use  OBJECTIVE:   HAND DOMINANCE: Right  ADLs:Pt reports difficulty with the following tasks; button jeans, donn/ doff earrings, open water bottles Pt/ mom report she uses LUE only 5% of the time. Overall ADLs: increased time required Transfers/ambulation related to ADLs:mod I with hx of falls Eating: needs assist with cutting food , difficulty with spreading cream cheese Grooming: needs assist with brushing and putty hair in a pony tail difficulty opening mascara and lip gloss UB Dressing: mod I  LB Dressing: needs assist  with tying and getting shoes on Toileting: mod I Bathing: needs assist with washing hair, difficulty washing R side Tub Shower transfers:  has sliding tub bench , supervision Equipment: Transfer tub bench  IADLs: Shopping: need assist for  Light housekeeping: does laundry , cleans bathroom,  dusts  Meal Prep: pt makes mac and cheese, garlic  bread in frying pan  however pt needs tortilla bag Community mobility: mod I Medication management: needs reminders, pt sorts  R handed   MOBILITY STATUS: Independent    UPPER EXTREMITY ROM:    Active ROM Right eval Left eval  Shoulder flexion  105  Shoulder abduction    Shoulder adduction    Shoulder extension    Shoulder internal rotation    Shoulder external rotation    Elbow flexion  125   Elbow extension  -35  Wrist flexion  -65 at rest  Wrist extension  -65/-15 P/ROM  Wrist ulnar deviation    Wrist radial deviation    Wrist pronation    Wrist supination  Unable/ 75% P/ROM  (Blank rows = not tested)  HAND FUNCTION: Grip strength: Right: NT lbs; Left: NT lbs  COORDINATION: LUE is impaired pt was able to pick up a block 1/3 trials to place in container  SENSATION: Not tested    MUSCLE TONE: LUE: Mild and Hypertonic  COGNITION: Overall cognitive status:  decreased short term memory per PT report    VISION ASSESSMENT: Not tested     OBSERVATIONS: Pt is motivated  and pleasant .   TODAY'S TREATMENT:                                                                                                                              DATE:02/03/23--Pt performed tableslides for gentle stretch for shoulder flexion, abduction. Therapist completed fabrication and fitting of a custom resting hand splint for improved positioning of digits and thumb. Pt was instructed in splint wear, care and precautions. Pt's hired caregiver was present. Pt took pictures on her phone of correct positioning. Pt wore the splint for approximately 8-10 mins while therapist provided instructions.  02/02/23-Pt performed tableslides for gentle stretch for shoulder flexion, abduction. Functional grasp/ release of wooden dowel pegs to remove and replace in pegboard, min-mod difficulty.Therapist started fabrication of a custom resting hand splint for improved stretch to left thumb. Splint was not issued due to time constraints.  01/28/23- Supine on mat, gentle passive stretch to LUE in abduction, and shoulder flexion, while facilitating scapula.Supine on mat holding PVC pipe, pt performed chest press, and shoulder flexion with min-mod facilitation/ v.c for left arm and scapula positioning while wearing thumb spica (benke)splint  Therapist checked progress towards short term goals. Pt was able to grasp and  pick up/ release a 1 inch block today, 4/5 trials with stretching in between. Functional grasp/ release of wooden dowel pegs to place on target, mod difficulty/ v.c Note sent to patients mother regarding potential custom resting splint, plans for the Fugl-meyer and potential break from therapy in October   01/21/23- supine on mat passive stretch to LUE in abduction and elbow extension, while pt performed lower trunk rotation. Seated edge of mat weightbearing through left elbow over ball with body on arm movements  and lateral trunk flexion, and performing diagonals with RUE, min-mod facilitation/ v.c Supine on mat holding PVC pipe, pt performed chest press, and shoulder flexion with min-mod facilitation/ v.c for left arm and scapula positioning while wearing thumb spica (benke)splint  Self stretch elbow extension reaching for the floor. Standing at table wearing splint for weightbearing through ball for circumduction and rolling forwards and back with bilateral UE's, mood facilitation. Drumming with LUE to hit target mod facilitation for LUE forearm rotation, elbow flexion/ extension. Ar, bike x 3 mins with hand wrapped mod facilitation Pt requests to leave early for voice class.   01/19/23-Seated on mat gentle passive stretch to left forearm in supination, and digits in extension.  Pt performed self stretch shoulder flexion reaching for floor, min v.c Seated edge of mat weightbearing through left elbow over ball with body on arm movements and lateral trunk flexion, and performing diagonals with RUE, min-mod facilitation/ v.c Supine on mat holding PVC pipe, pt performed chest press, and shoulder flexion with min-mod facilitation/ v.c for left arm and scapula positioning while wearing thumb spica (benke)splint  Pt's reports she has not attempted at home. Seated at table pt performed shoulder flexion and abduction table slides , 10 reps each min v.c  Functional grasp/ release of wooden dowel pegs while  wearing splint, min-mod facilitation/ v.c, to remove and replace pegs with emphasis on more normal movement patterns while wearing splint. Pt reports improving ability to use her left hand for grasp/ release.  01/14/23 Seated edge of mat weightbearing through left elbow over ball with body on arm movements and lateral trunk flexion, and performing diagonals with RUE, min-mod facilitation/ v.c Pt's thumb spica portion of the Beneke splint was applied then pt performed the following exercise. Supine on mat holding PVC pipe, pt performed chest press, and shoulder flexion with min-mod facilitation/ v.c. Pt's caregiver took a video so that pt can perform at home. Pt/ caregiver verbalize understanding Seated at table pt performed shoulder flexion and abduction table slides min v.c  Functional grasp/ release of wooden dowel pegs while wearing splint, min-mod facilitation/ v.c Pt practiced using bilateral UE's to open an water bottle with mod facilitation.Pt requested to leave early for voice lessons.   01/12/23-Pt reports wearing splint 30 mins-1 hour at home without issues.Seated edge of mat weightbearing through left elbow over foam roll, with body on arm movements and lateral trunk flexion, min-mod facilitation/ v.c Supine, self ROM shoulder flexion with therapist facilitating scapula, min facilitation/ v.c Supine, passive shoulder abduction stretch with lower trunk rotation, min v.c and mod facilitation. Wearing thumb portion of Beneke splint, pt performed activities to increase functional use of LUE: functional grasp/ release of cones, picking up 1 inch blocks to place in container, and folding wash cloths, mod facilitation/ v.c passive stretch to forearm and digits prior to task. 01/07/23 Pt brought in her previously issued pre-fab splints which she has not been wearing consistently. Pt donned the thumb spica sleeve first. Gentle passive stretch to digits followed by grasp/ release of wooden dowel pegs  while wear splint and without, min-mod facilitation/ v.c. Thumb spica portion appears to assist with grasp. Therapist then assessed the pan / resting splint portion. It appears to fit well and pt was encouraged to wear for 30 mins- 1 hour daily when watching TV.   12/30/22-sidelying on mat, therapist performed scapular mobs, followed by supine, pt self ROM shoulder flexion with therapist facilitating scapula. Seated edge of mat weightbearing through left elbow over ball with  body on arm movements and lateral trunk flexion, followed by thread the needle min facilitation/ v.c, (Pt repeated with elbow supported on 2 pillows) Prone on elbows, lifting chest for weightbearing and scapular activation, min facilitation/ v.c. Quadraped weightbearing through physio ball on elbows rolling forwards and backwards for shoulder flexion and weightbearing, min facilitation P/ROM forearm supination, then P/ROM finger and thumb extension Self stretch reach for the floor, min v.c Functional grasp/ release of 1 inch blocks to place in container, mod facilitation/ v.c  12/29/22- Seated edge of mat weightbearing through left elbow over ball with body on arm movements and lateral trunk flexion, min-mod facilitation/ v.c Standing edge of mat weightbearing through physio ball on mat for shoulder flexion and weightbearing, min facilitation P/ROM forearm supination, then pt performed on herself with min v.c and demonstration P/ROM finger and thumb extension Functional grasp/ release of wooden dowel pegs to remove from pegboard then to replace with min-mod facilitation/ v.c, mod difficulty. Pt simulated washing her legs with LUE using hand over hand assist with washcloth.       12/17/22- eval only   PATIENT EDUCATION: Education details:  splint wear, care and precautions Person educated: Patient and caregiver Education method: Explanation, demonstration, v.c, handout Education comprehension: verbalized understanding,  returned demonstration  HOME EXERCISE PROGRAM: Weightbearing and supine shoulder flexion   GOALS: Goals reviewed with patient? Yes  SHORT TERM GOALS: Target date: 01/16/23  I with initial HEP.  Goal status:  met, pt demonstrates understanding 01/19/23  2.  Pt will report using LUE as a gross assist at least 10% of the time for ADLs.   Goal status: ongoing 01/12/23  3.  Pt will demonstrate ability to pick up a 1 inch block with LUE 2/5 trials consistently.  Goal status:  met, 01/28/23 today 4/5 trials, with stretching in between trials  4.  I with positioning to minimize contracture including splinting prn  Goal status: met, pt is wearing her splint with resting pan portion daily 01/14/23  5.  Pt will increase LUE A/ROM shoulder flexion to 110* for increased ease with ADLs. Baseline: 105 Goal status: partially met 110 with mod compensation  01/28/23  6.    LONG TERM GOALS: Target date: 03/11/23  I with updated HEP.  Goal status: INITIAL  2.  Pt will report increased ease with fastening jeans, opening water bottles and donning/ doffing earrings.  Goal status: INITIAL  3.  Pt will use LUE to assist with brushing her hair. Baseline:  Goal status: INITIAL  4.   Pt will report using LUE as a gross / active assist at least 15% of the time for ADLs.  Goal status: INITIAL  5.  Pt will demonstrate -30 elbow extension in prep for functional reach.  Goal status: INITIAL  6. Assess for vivistim prn Baseline: inital    ASSESSMENT:  CLINICAL IMPRESSION: Patient is progressing towards goals. Pt demonstrates improved positioning of digits and thumb in resting hand splint. Pt's father was shown pt's splint and pt took pictures for her mom to see. Pt's hired caregiver was present during instructions. PERFORMANCE DEFICITS: in functional skills including ADLs, IADLs, coordination, dexterity, proprioception, sensation, tone, ROM, strength, flexibility, Fine motor control, Gross  motor control, mobility, balance, endurance, decreased knowledge of precautions, decreased knowledge of use of DME, vision, and UE functional use, cognitive skills including memory and thought, and psychosocial skills including coping strategies, environmental adaptation, habits, interpersonal interactions, and routines and behaviors.   IMPAIRMENTS: are limiting patient from ADLs,  IADLs, work, play, leisure, and social participation.   CO-MORBIDITIES: may have co-morbidities  that affects occupational performance. Patient will benefit from skilled OT to address above impairments and improve overall function.  MODIFICATION OR ASSISTANCE TO COMPLETE EVALUATION: No modification of tasks or assist necessary to complete an evaluation.  OT OCCUPATIONAL PROFILE AND HISTORY: Detailed assessment: Review of records and additional review of physical, cognitive, psychosocial history related to current functional performance.  CLINICAL DECISION MAKING: LOW - limited treatment options, no task modification necessary  REHAB POTENTIAL: Good  EVALUATION COMPLEXITY: Low    PLAN:  OT FREQUENCY: 2x/week plus eval  OT DURATION: 12 weeks  PLANNED INTERVENTIONS: self care/ADL training, therapeutic exercise, therapeutic activity, neuromuscular re-education, manual therapy, passive range of motion, functional mobility training, splinting, electrical stimulation, ultrasound, paraffin, moist heat, cryotherapy, contrast bath, patient/family education, cognitive remediation/compensation, visual/perceptual remediation/compensation, energy conservation, coping strategies training, DME and/or AE instructions, and Re-evaluation  RECOMMENDED OTHER SERVICES: PT  CONSULTED AND AGREED WITH PLAN OF CARE: Patient and family member/caregiver  PLAN FOR NEXT SESSION:   splint check and modifications, consider Fugl-meyer in future visits Arabell Neria, OT 02/03/2023, 4:22 PM

## 2023-02-04 ENCOUNTER — Ambulatory Visit: Payer: BC Managed Care – PPO | Attending: Neurology | Admitting: Occupational Therapy

## 2023-02-05 ENCOUNTER — Telehealth: Payer: Self-pay | Admitting: Family Medicine

## 2023-02-05 NOTE — Telephone Encounter (Signed)
Pt dropped of papers to be completed by PCP. Please fax and have copy to be picked up. Please call pt when this is complete.

## 2023-02-06 DIAGNOSIS — Z0279 Encounter for issue of other medical certificate: Secondary | ICD-10-CM

## 2023-02-06 NOTE — Telephone Encounter (Signed)
Done

## 2023-02-06 NOTE — Telephone Encounter (Signed)
Form has been faxed. Copy placed up front. Pt is aware.

## 2023-02-06 NOTE — Telephone Encounter (Signed)
Form in folder for completion.

## 2023-02-09 ENCOUNTER — Ambulatory Visit: Payer: BC Managed Care – PPO | Admitting: Occupational Therapy

## 2023-02-09 DIAGNOSIS — R2681 Unsteadiness on feet: Secondary | ICD-10-CM

## 2023-02-09 DIAGNOSIS — R2689 Other abnormalities of gait and mobility: Secondary | ICD-10-CM

## 2023-02-09 DIAGNOSIS — R278 Other lack of coordination: Secondary | ICD-10-CM

## 2023-02-09 DIAGNOSIS — R293 Abnormal posture: Secondary | ICD-10-CM

## 2023-02-09 DIAGNOSIS — M6281 Muscle weakness (generalized): Secondary | ICD-10-CM | POA: Diagnosis not present

## 2023-02-09 DIAGNOSIS — I69954 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: Secondary | ICD-10-CM

## 2023-02-09 NOTE — Therapy (Unsigned)
OUTPATIENT OCCUPATIONAL THERAPY NEURO  treatment  Patient Name: Kerri Robertson MRN: [id number] DOB:2000-09-29, 22 y.o., female Today's Date: 02/10/2023  PCP: none REFERRING PROVIDER: Dr. Reola Calkins  END OF SESSION:  OT End of Session - 02/10/23 0805     Visit Number 12    Number of Visits 25    Date for OT Re-Evaluation 03/11/23    Authorization Time Period 12 weeks    OT Start Time 0933    OT Stop Time 1012    OT Time Calculation (min) 39 min    Activity Tolerance Patient tolerated treatment well    Behavior During Therapy WFL for tasks assessed/performed                  Past Medical History:  Diagnosis Date   Allergy    Cerebral palsy ([name])    CP (cerebral palsy) ([name])    Stroke ([name])    Past Surgical History:  Procedure Laterality Date   ARM SURGERY     2013   FOOT SURGERY     2010   HAMSTRING LENGTHENING     TIBIA / FIBIA LENGTHENING     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   Balance problem 05/06/2018   Syncope 05/06/2018   History of cerebrovascular accident 11/18/2017   Anxiety state 06/25/2017   Spasticity 06/25/2017   Allergic rhinitis 10/21/2016   Mild intermittent asthma 10/21/2016    ONSET DATE: 12/01/22  REFERRING DIAG: G80.2 (ICD-10-CM) - Spastic hemiplegic cerebral palsy ([name])  THERAPY DIAG:  Muscle weakness (generalized)  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)  Other lack of coordination  Abnormal posture  Unsteadiness on feet  Other abnormalities of gait and mobility  Rationale  for Evaluation and Treatment: Rehabilitation  SUBJECTIVE:   SUBJECTIVE STATEMENT: Pt reports she has not had many opportunities to wear her splint  PERTINENT HISTORY: 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 PIR:JJOACZY of multiple surgeries, particularly on the upper and lower extremities, generalized weakness, more pronounced on the left side, and a contracture in the left arm   PRECAUTIONS: Fall  WEIGHT BEARING RESTRICTIONS: No  PAIN:  Are you having pain? No  FALLS: Has patient fallen in last 6 months? Yes.   LIVING ENVIRONMENT: Lives with: lives with their family Lives in: House/apartment   PLOF: Needs assistance with ADLs and Needs assistance with homemaking  PATIENT GOALS: increase LUE functional use  OBJECTIVE:   HAND DOMINANCE: Right  ADLs:Pt reports difficulty with the following tasks; button jeans, donn/ doff earrings, open water bottles Pt/ mom report she uses LUE only 5% of the time. Overall ADLs: increased time required Transfers/ambulation related to ADLs:mod I with hx of falls Eating: needs assist with cutting food , difficulty with spreading cream cheese Grooming: needs assist with brushing and putty hair in a pony tail difficulty opening mascara and lip gloss UB Dressing: mod I  LB Dressing: needs assist  with tying and getting shoes on Toileting: mod I Bathing: needs assist with washing hair, difficulty washing R side Tub Shower transfers:  has  sliding tub bench , supervision Equipment: Transfer tub bench  IADLs: Shopping: need assist for  Light housekeeping: does laundry , cleans bathroom, dusts  Meal Prep: pt makes mac and cheese, garlic  bread in frying pan  however pt needs tortilla bag Community mobility: mod I Medication management: needs reminders, pt sorts  R handed   MOBILITY STATUS: Independent    UPPER EXTREMITY ROM:    Active ROM Right eval Left eval  Shoulder flexion  105  Shoulder abduction     Shoulder adduction    Shoulder extension    Shoulder internal rotation    Shoulder external rotation    Elbow flexion  125  Elbow extension  -35  Wrist flexion  -65 at rest  Wrist extension  -65/-15 P/ROM  Wrist ulnar deviation    Wrist radial deviation    Wrist pronation    Wrist supination  Unable/ 75% P/ROM  (Blank rows = not tested)  HAND FUNCTION: Grip strength: Right: NT lbs; Left: NT lbs  COORDINATION: LUE is impaired pt was able to pick up a block 1/3 trials to place in container  SENSATION: Not tested    MUSCLE TONE: LUE: Mild and Hypertonic  COGNITION: Overall cognitive status:  decreased short term memory per PT report    VISION ASSESSMENT: Not tested     OBSERVATIONS: Pt is motivated  and pleasant .   TODAY'S TREATMENT:                                                                                                                              DATE:02/09/23- Pt arrived wearing her splint. She reports she has not had many opportunities to wear her splint. Pt reports she has had splint on for approximately 45 mins without issues, and pt's hand seems looser after removing. Pt picked up several blocks  with min-mod difficulty. Pt then transitioned to removing wooden dowel pegs from pegboard. Pt performed this with increased ease today after wearing splint, min difficulty/ v.c  Pt did not require facilitation. Cane exercises for shoulder flexion reaching towards floor and chest press, min v.c and facilitation. Pt then worked to replace pegs with mod difficulty and min facilitation. Note sent with pt's to her mom regarding plans to add 1 additional visit, to complete Fugl-Meyer and that pt may benefit from additional therapy in 4-6 months.  02/03/23--Pt performed tableslides for gentle stretch for shoulder flexion, abduction. Therapist completed fabrication and fitting of a custom resting hand splint for improved positioning of digits and thumb. Pt was instructed in  splint wear, care and precautions. Pt's hired caregiver was present. Pt took pictures on her phone of correct positioning. Pt wore the splint for approximately 8-10 mins while therapist provided instructions.  02/02/23-Pt performed tableslides for gentle stretch for shoulder flexion, abduction. Functional grasp/ release of wooden dowel pegs to remove and replace in pegboard, min-mod difficulty.Therapist started fabrication of a custom resting hand splint for improved stretch  to left thumb. Splint was not issued due to time constraints.  01/28/23- Supine on mat, gentle passive stretch to LUE in abduction, and shoulder flexion, while facilitating scapula.Supine on mat holding PVC pipe, pt performed chest press, and shoulder flexion with min-mod facilitation/ v.c for left arm and scapula positioning while wearing thumb spica (benke)splint  Therapist checked progress towards short term goals. Pt was able to grasp and pick up/ release a 1 inch block today, 4/5 trials with stretching in between. Functional grasp/ release of wooden dowel pegs to place on target, mod difficulty/ v.c Note sent to patients mother regarding potential custom resting splint, plans for the Fugl-meyer and potential break from therapy in October   01/21/23- supine on mat passive stretch to LUE in abduction and elbow extension, while pt performed lower trunk rotation. Seated edge of mat weightbearing through left elbow over ball with body on arm movements and lateral trunk flexion, and performing diagonals with RUE, min-mod facilitation/ v.c Supine on mat holding PVC pipe, pt performed chest press, and shoulder flexion with min-mod facilitation/ v.c for left arm and scapula positioning while wearing thumb spica (benke)splint  Self stretch elbow extension reaching for the floor. Standing at table wearing splint for weightbearing through ball for circumduction and rolling forwards and back with bilateral UE's, mood facilitation. Drumming  with LUE to hit target mod facilitation for LUE forearm rotation, elbow flexion/ extension. Ar, bike x 3 mins with hand wrapped mod facilitation Pt requests to leave early for voice class.   01/19/23-Seated on mat gentle passive stretch to left forearm in supination, and digits in extension.  Pt performed self stretch shoulder flexion reaching for floor, min v.c Seated edge of mat weightbearing through left elbow over ball with body on arm movements and lateral trunk flexion, and performing diagonals with RUE, min-mod facilitation/ v.c Supine on mat holding PVC pipe, pt performed chest press, and shoulder flexion with min-mod facilitation/ v.c for left arm and scapula positioning while wearing thumb spica (benke)splint  Pt's reports she has not attempted at home. Seated at table pt performed shoulder flexion and abduction table slides , 10 reps each min v.c  Functional grasp/ release of wooden dowel pegs while wearing splint, min-mod facilitation/ v.c, to remove and replace pegs with emphasis on more normal movement patterns while wearing splint. Pt reports improving ability to use her left hand for grasp/ release.  01/14/23 Seated edge of mat weightbearing through left elbow over ball with body on arm movements and lateral trunk flexion, and performing diagonals with RUE, min-mod facilitation/ v.c Pt's thumb spica portion of the Beneke splint was applied then pt performed the following exercise. Supine on mat holding PVC pipe, pt performed chest press, and shoulder flexion with min-mod facilitation/ v.c. Pt's caregiver took a video so that pt can perform at home. Pt/ caregiver verbalize understanding Seated at table pt performed shoulder flexion and abduction table slides min v.c  Functional grasp/ release of wooden dowel pegs while wearing splint, min-mod facilitation/ v.c Pt practiced using bilateral UE's to open an water bottle with mod facilitation.Pt requested to leave early for voice  lessons.   01/12/23-Pt reports wearing splint 30 mins-1 hour at home without issues.Seated edge of mat weightbearing through left elbow over foam roll, with body on arm movements and lateral trunk flexion, min-mod facilitation/ v.c Supine, self ROM shoulder flexion with therapist facilitating scapula, min facilitation/ v.c Supine, passive shoulder abduction stretch with lower trunk rotation, min v.c and mod facilitation. Wearing thumb portion  of Beneke splint, pt performed activities to increase functional use of LUE: functional grasp/ release of cones, picking up 1 inch blocks to place in container, and folding wash cloths, mod facilitation/ v.c passive stretch to forearm and digits prior to task. 01/07/23 Pt brought in her previously issued pre-fab splints which she has not been wearing consistently. Pt donned the thumb spica sleeve first. Gentle passive stretch to digits followed by grasp/ release of wooden dowel pegs while wear splint and without, min-mod facilitation/ v.c. Thumb spica portion appears to assist with grasp. Therapist then assessed the pan / resting splint portion. It appears to fit well and pt was encouraged to wear for 30 mins- 1 hour daily when watching TV.   12/30/22-sidelying on mat, therapist performed scapular mobs, followed by supine, pt self ROM shoulder flexion with therapist facilitating scapula. Seated edge of mat weightbearing through left elbow over ball with body on arm movements and lateral trunk flexion, followed by thread the needle min facilitation/ v.c, (Pt repeated with elbow supported on 2 pillows) Prone on elbows, lifting chest for weightbearing and scapular activation, min facilitation/ v.c. Quadraped weightbearing through physio ball on elbows rolling forwards and backwards for shoulder flexion and weightbearing, min facilitation P/ROM forearm supination, then P/ROM finger and thumb extension Self stretch reach for the floor, min v.c Functional grasp/ release  of 1 inch blocks to place in container, mod facilitation/ v.c  12/29/22- Seated edge of mat weightbearing through left elbow over ball with body on arm movements and lateral trunk flexion, min-mod facilitation/ v.c Standing edge of mat weightbearing through physio ball on mat for shoulder flexion and weightbearing, min facilitation P/ROM forearm supination, then pt performed on herself with min v.c and demonstration P/ROM finger and thumb extension Functional grasp/ release of wooden dowel pegs to remove from pegboard then to replace with min-mod facilitation/ v.c, mod difficulty. Pt simulated washing her legs with LUE using hand over hand assist with washcloth.       12/17/22- eval only   PATIENT EDUCATION: Education details:  splint wear schedule and precautions Person educated: Patient and caregiver Education method: Explanation, demonstration, v.c, handout Education comprehension: verbalized understanding, returned demonstration  HOME EXERCISE PROGRAM: Weightbearing and supine shoulder flexion   GOALS: Goals reviewed with patient? Yes  SHORT TERM GOALS: Target date: 01/16/23  I with initial HEP.  Goal status:  met, pt demonstrates understanding 01/19/23  2.  Pt will report using LUE as a gross assist at least 10% of the time for ADLs.   Goal status: not met, not using consistently, 02/09/23  3.  Pt will demonstrate ability to pick up a 1 inch block with LUE 2/5 trials consistently.  Goal status:  met, 01/28/23 today 4/5 trials, with stretching in between trials  4.  I with positioning to minimize contracture including splinting prn  Goal status: met, pt is wearing her splint with resting pan portion daily 01/14/23  5.  Pt will increase LUE A/ROM shoulder flexion to 110* for increased ease with ADLs. Baseline: 105 Goal status: partially met 110 with mod compensation  01/28/23  6.    LONG TERM GOALS: Target date: 03/11/23  I with updated HEP.  Goal status:  INITIAL  2.  Pt will report increased ease with fastening jeans, opening water bottles and donning/ doffing earrings.  Goal status: INITIAL  3.  Pt will use LUE to assist with brushing her hair. Baseline:  Goal status: not met, 02/09/23  4.   Pt will report  using LUE as a gross / active assist at least 15% of the time for ADLs.  Goal status: INITIAL  5.  Pt will demonstrate -30 elbow extension in prep for functional reach.  Goal status: ongoing   6. Assess for vivistim prn Baseline: inital    ASSESSMENT:  CLINICAL IMPRESSION: Patient is progressing towards goals. Pt wore her splint briefly today without issues and her hand appeared to be looser for functional activities. PERFORMANCE DEFICITS: in functional skills including ADLs, IADLs, coordination, dexterity, proprioception, sensation, tone, ROM, strength, flexibility, Fine motor control, Gross motor control, mobility, balance, endurance, decreased knowledge of precautions, decreased knowledge of use of DME, vision, and UE functional use, cognitive skills including memory and thought, and psychosocial skills including coping strategies, environmental adaptation, habits, interpersonal interactions, and routines and behaviors.   IMPAIRMENTS: are limiting patient from ADLs, IADLs, work, play, leisure, and social participation.   CO-MORBIDITIES: may have co-morbidities  that affects occupational performance. Patient will benefit from skilled OT to address above impairments and improve overall function.  MODIFICATION OR ASSISTANCE TO COMPLETE EVALUATION: No modification of tasks or assist necessary to complete an evaluation.  OT OCCUPATIONAL PROFILE AND HISTORY: Detailed assessment: Review of records and additional review of physical, cognitive, psychosocial history related to current functional performance.  CLINICAL DECISION MAKING: LOW - limited treatment options, no task modification necessary  REHAB POTENTIAL:  Good  EVALUATION COMPLEXITY: Low    PLAN:  OT FREQUENCY: 2x/week plus eval  OT DURATION: 12 weeks  PLANNED INTERVENTIONS: self care/ADL training, therapeutic exercise, therapeutic activity, neuromuscular re-education, manual therapy, passive range of motion, functional mobility training, splinting, electrical stimulation, ultrasound, paraffin, moist heat, cryotherapy, contrast bath, patient/family education, cognitive remediation/compensation, visual/perceptual remediation/compensation, energy conservation, coping strategies training, DME and/or AE instructions, and Re-evaluation  RECOMMENDED OTHER SERVICES: PT  CONSULTED AND AGREED WITH PLAN OF CARE: Patient and family member/caregiver  PLAN FOR NEXT SESSION:   complete Fugl-Meyer 02/10/2023, 8:12 AM

## 2023-02-10 ENCOUNTER — Ambulatory Visit: Payer: BC Managed Care – PPO | Admitting: Occupational Therapy

## 2023-02-10 ENCOUNTER — Encounter: Payer: Self-pay | Admitting: Occupational Therapy

## 2023-02-10 DIAGNOSIS — R2681 Unsteadiness on feet: Secondary | ICD-10-CM

## 2023-02-10 DIAGNOSIS — R278 Other lack of coordination: Secondary | ICD-10-CM

## 2023-02-10 DIAGNOSIS — M6281 Muscle weakness (generalized): Secondary | ICD-10-CM | POA: Diagnosis not present

## 2023-02-10 DIAGNOSIS — R293 Abnormal posture: Secondary | ICD-10-CM

## 2023-02-10 DIAGNOSIS — I69954 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: Secondary | ICD-10-CM

## 2023-02-10 NOTE — Therapy (Signed)
OUTPATIENT OCCUPATIONAL THERAPY NEURO  treatment  Patient Name: Kerri Robertson MRN: [id number] DOB:18-Jun-2000, 22 y.o., female Today's Date: 02/10/2023  PCP: none REFERRING PROVIDER: Dr. Reola Calkins  END OF SESSION:  OT End of Session - 02/10/23 1629     Visit Number 13    Number of Visits 25    Date for OT Re-Evaluation 03/11/23    Authorization Type BCBS/Medicaid    Authorization Time Period 12 weeks    OT Start Time 1534    OT Stop Time 1615    OT Time Calculation (min) 41 min                  Past Medical History:  Diagnosis Date   Allergy    Cerebral palsy ([name])    CP (cerebral palsy) ([name])    Stroke ([name])    Past Surgical History:  Procedure Laterality Date   ARM SURGERY     2013   FOOT SURGERY     2010   HAMSTRING LENGTHENING     TIBIA / FIBIA LENGTHENING     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   Balance problem 05/06/2018   Syncope 05/06/2018   History of cerebrovascular accident 11/18/2017   Anxiety state 06/25/2017   Spasticity 06/25/2017   Allergic rhinitis 10/21/2016   Mild intermittent asthma 10/21/2016    ONSET DATE: 12/01/22  REFERRING DIAG: G80.2 (ICD-10-CM) - Spastic hemiplegic cerebral palsy ([name])  THERAPY DIAG:  Muscle weakness (generalized)  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)  Other lack of coordination  Abnormal posture  Unsteadiness on feet  Rationale for Evaluation and Treatment: Rehabilitation  SUBJECTIVE:   SUBJECTIVE STATEMENT: Pt reports  she has been busy with  theater and unable to wear her splint  PERTINENT HISTORY: 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 QIO:NGEXBMW of multiple surgeries, particularly on the upper and lower extremities, generalized weakness, more pronounced on the left side, and a contracture in the left arm   PRECAUTIONS: Fall  WEIGHT BEARING RESTRICTIONS: No  PAIN:  Are you having pain? No  FALLS: Has patient fallen in last 6 months? Yes.   LIVING ENVIRONMENT: Lives with: lives with their family Lives in: House/apartment   PLOF: Needs assistance with ADLs and Needs assistance with homemaking  PATIENT GOALS: increase LUE functional use  OBJECTIVE:   HAND DOMINANCE: Right  ADLs:Pt reports difficulty with the following tasks; button jeans, donn/ doff earrings, open water bottles Pt/ mom report she uses LUE only 5% of the time. Overall ADLs: increased time required Transfers/ambulation related to ADLs:mod I with hx of falls Eating: needs assist with cutting food , difficulty with spreading cream cheese Grooming: needs assist with brushing and putty hair in a pony tail difficulty opening mascara and lip gloss UB Dressing: mod I  LB Dressing: needs assist  with tying and getting shoes on Toileting: mod I Bathing: needs assist with washing hair, difficulty washing R side Tub Shower transfers:  has sliding tub bench , supervision Equipment: Transfer tub bench  IADLs: Shopping: need assist for  Light  housekeeping: does laundry , cleans bathroom, dusts  Meal Prep: pt makes mac and cheese, garlic  bread in frying pan  however pt needs tortilla bag Community mobility: mod I Medication management: needs reminders, pt sorts  R handed   MOBILITY STATUS: Independent    UPPER EXTREMITY ROM:    Active ROM Right eval Left eval  Shoulder flexion  105  Shoulder abduction    Shoulder adduction    Shoulder extension    Shoulder internal rotation    Shoulder external rotation    Elbow  flexion  125  Elbow extension  -35  Wrist flexion  -65 at rest  Wrist extension  -65/-15 P/ROM  Wrist ulnar deviation    Wrist radial deviation    Wrist pronation    Wrist supination  Unable/ 75% P/ROM  (Blank rows = not tested)  HAND FUNCTION: Grip strength: Right: NT lbs; Left: NT lbs  COORDINATION: LUE is impaired pt was able to pick up a block 1/3 trials to place in container  SENSATION: Not tested    MUSCLE TONE: LUE: Mild and Hypertonic  COGNITION: Overall cognitive status:  decreased short term memory per PT report    VISION ASSESSMENT: Not tested     OBSERVATIONS: Pt is motivated  and pleasant .   TODAY'S TREATMENT:                                                                                                                              DATE:02/10/23 Therapist administered Fugl- Meyer. Pt scored 15/56. Pt does not qualify for vivistim based upon this score, minimum score is 20/56 to qualify. Supine on mat closed chain shoulder flexion and chest press with PVC pipe, min facilitation/v.c Standing at table rolling small ball forwards and backwards with bilateral UE's, followed by table slides forwards and backwards and side to side, min v.c Weightbearing through left elbow with body on arm movements, min v.c and facilitation. Removing wooden dowel pegs from pegboard for increased LUE functional use, min difficulty/ v.c  02/09/23- Pt arrived wearing her splint. She reports she has not had many opportunities to wear her splint. Pt reports she has had splint on for approximately 45 mins without issues, and pt's hand seems looser after removing. Pt picked up several blocks  with min-mod difficulty. Pt then transitioned to removing wooden dowel pegs from pegboard. Pt performed this with increased ease today after wearing splint, min difficulty/ v.c  Pt did not require facilitation. Cane exercises for shoulder flexion reaching towards floor and chest press, min v.c and  facilitation. Pt then worked to replace pegs with mod difficulty and min facilitation. Note sent with pt's to her mom regarding plans to add 1 additional visit, to complete Fugl-Meyer and that pt may benefit from additional therapy in 4-6 months.  02/03/23--Pt performed tableslides for gentle stretch for shoulder flexion, abduction. Therapist completed fabrication and fitting of a custom resting hand splint for improved positioning of  digits and thumb. Pt was instructed in splint wear, care and precautions. Pt's hired caregiver was present. Pt took pictures on her phone of correct positioning. Pt wore the splint for approximately 8-10 mins while therapist provided instructions.  02/02/23-Pt performed tableslides for gentle stretch for shoulder flexion, abduction. Functional grasp/ release of wooden dowel pegs to remove and replace in pegboard, min-mod difficulty.Therapist started fabrication of a custom resting hand splint for improved stretch to left thumb. Splint was not issued due to time constraints.  01/28/23- Supine on mat, gentle passive stretch to LUE in abduction, and shoulder flexion, while facilitating scapula.Supine on mat holding PVC pipe, pt performed chest press, and shoulder flexion with min-mod facilitation/ v.c for left arm and scapula positioning while wearing thumb spica (benke)splint  Therapist checked progress towards short term goals. Pt was able to grasp and pick up/ release a 1 inch block today, 4/5 trials with stretching in between. Functional grasp/ release of wooden dowel pegs to place on target, mod difficulty/ v.c Note sent to patients mother regarding potential custom resting splint, plans for the Fugl-meyer and potential break from therapy in October   01/21/23- supine on mat passive stretch to LUE in abduction and elbow extension, while pt performed lower trunk rotation. Seated edge of mat weightbearing through left elbow over ball with body on arm movements and lateral  trunk flexion, and performing diagonals with RUE, min-mod facilitation/ v.c Supine on mat holding PVC pipe, pt performed chest press, and shoulder flexion with min-mod facilitation/ v.c for left arm and scapula positioning while wearing thumb spica (benke)splint  Self stretch elbow extension reaching for the floor. Standing at table wearing splint for weightbearing through ball for circumduction and rolling forwards and back with bilateral UE's, mood facilitation. Drumming with LUE to hit target mod facilitation for LUE forearm rotation, elbow flexion/ extension. Ar, bike x 3 mins with hand wrapped mod facilitation Pt requests to leave early for voice class.   01/19/23-Seated on mat gentle passive stretch to left forearm in supination, and digits in extension.  Pt performed self stretch shoulder flexion reaching for floor, min v.c Seated edge of mat weightbearing through left elbow over ball with body on arm movements and lateral trunk flexion, and performing diagonals with RUE, min-mod facilitation/ v.c Supine on mat holding PVC pipe, pt performed chest press, and shoulder flexion with min-mod facilitation/ v.c for left arm and scapula positioning while wearing thumb spica (benke)splint  Pt's reports she has not attempted at home. Seated at table pt performed shoulder flexion and abduction table slides , 10 reps each min v.c  Functional grasp/ release of wooden dowel pegs while wearing splint, min-mod facilitation/ v.c, to remove and replace pegs with emphasis on more normal movement patterns while wearing splint. Pt reports improving ability to use her left hand for grasp/ release.  01/14/23 Seated edge of mat weightbearing through left elbow over ball with body on arm movements and lateral trunk flexion, and performing diagonals with RUE, min-mod facilitation/ v.c Pt's thumb spica portion of the Beneke splint was applied then pt performed the following exercise. Supine on mat holding PVC pipe, pt  performed chest press, and shoulder flexion with min-mod facilitation/ v.c. Pt's caregiver took a video so that pt can perform at home. Pt/ caregiver verbalize understanding Seated at table pt performed shoulder flexion and abduction table slides min v.c  Functional grasp/ release of wooden dowel pegs while wearing splint, min-mod facilitation/ v.c Pt practiced using bilateral UE's to open an water  bottle with mod facilitation.Pt requested to leave early for voice lessons.   01/12/23-Pt reports wearing splint 30 mins-1 hour at home without issues.Seated edge of mat weightbearing through left elbow over foam roll, with body on arm movements and lateral trunk flexion, min-mod facilitation/ v.c Supine, self ROM shoulder flexion with therapist facilitating scapula, min facilitation/ v.c Supine, passive shoulder abduction stretch with lower trunk rotation, min v.c and mod facilitation. Wearing thumb portion of Beneke splint, pt performed activities to increase functional use of LUE: functional grasp/ release of cones, picking up 1 inch blocks to place in container, and folding wash cloths, mod facilitation/ v.c passive stretch to forearm and digits prior to task. 01/07/23 Pt brought in her previously issued pre-fab splints which she has not been wearing consistently. Pt donned the thumb spica sleeve first. Gentle passive stretch to digits followed by grasp/ release of wooden dowel pegs while wear splint and without, min-mod facilitation/ v.c. Thumb spica portion appears to assist with grasp. Therapist then assessed the pan / resting splint portion. It appears to fit well and pt was encouraged to wear for 30 mins- 1 hour daily when watching TV.   12/30/22-sidelying on mat, therapist performed scapular mobs, followed by supine, pt self ROM shoulder flexion with therapist facilitating scapula. Seated edge of mat weightbearing through left elbow over ball with body on arm movements and lateral trunk flexion,  followed by thread the needle min facilitation/ v.c, (Pt repeated with elbow supported on 2 pillows) Prone on elbows, lifting chest for weightbearing and scapular activation, min facilitation/ v.c. Quadraped weightbearing through physio ball on elbows rolling forwards and backwards for shoulder flexion and weightbearing, min facilitation P/ROM forearm supination, then P/ROM finger and thumb extension Self stretch reach for the floor, min v.c Functional grasp/ release of 1 inch blocks to place in container, mod facilitation/ v.c  12/29/22- Seated edge of mat weightbearing through left elbow over ball with body on arm movements and lateral trunk flexion, min-mod facilitation/ v.c Standing edge of mat weightbearing through physio ball on mat for shoulder flexion and weightbearing, min facilitation P/ROM forearm supination, then pt performed on herself with min v.c and demonstration P/ROM finger and thumb extension Functional grasp/ release of wooden dowel pegs to remove from pegboard then to replace with min-mod facilitation/ v.c, mod difficulty. Pt simulated washing her legs with LUE using hand over hand assist with washcloth.       12/17/22- eval only   PATIENT EDUCATION: Education details:  splint wear schedule and precautions Person educated: Patient and caregiver Education method: Explanation, demonstration, v.c, handout Education comprehension: verbalized understanding, returned demonstration  HOME EXERCISE PROGRAM: Weightbearing and supine shoulder flexion   GOALS: Goals reviewed with patient? Yes  SHORT TERM GOALS: Target date: 01/16/23  I with initial HEP.  Goal status:  met, pt demonstrates understanding 01/19/23  2.  Pt will report using LUE as a gross assist at least 10% of the time for ADLs.   Goal status: not met, not using consistently, 02/09/23  3.  Pt will demonstrate ability to pick up a 1 inch block with LUE 2/5 trials consistently.  Goal status:  met, 01/28/23  today 4/5 trials, with stretching in between trials  4.  I with positioning to minimize contracture including splinting prn  Goal status: met, pt is wearing her splint with resting pan portion daily 01/14/23  5.  Pt will increase LUE A/ROM shoulder flexion to 110* for increased ease with ADLs. Baseline: 105 Goal status: partially met  110 with mod compensation  01/28/23  6.    LONG TERM GOALS: Target date: 03/11/23  I with updated HEP.  Goal status: ongoing 02/10/23  2.  Pt will report increased ease with fastening jeans, opening water bottles and donning/ doffing earrings.  Goal status:  ongoing 02/10/23  3.  Pt will use LUE to assist with brushing her hair. Baseline:  Goal status: not met, 02/09/23  4.   Pt will report using LUE as a gross / active assist at least 15% of the time for ADLs.  Goal status: I ongoing, 02/10/23  5.  Pt will demonstrate -30 elbow extension in prep for functional reach.  Goal status: ongoing , ongoing  6. Assess for vivistim prn Baseline: met, Fugl- Meyer completed 02/10/23- score:15/ 56    ASSESSMENT:  CLINICAL IMPRESSION: Patient is progressing towards goals. Pt completed Fugl-Meyer, she does not qualify for vivistim at this time. Pt has made gains in LUE functional use overall. PERFORMANCE DEFICITS: in functional skills including ADLs, IADLs, coordination, dexterity, proprioception, sensation, tone, ROM, strength, flexibility, Fine motor control, Gross motor control, mobility, balance, endurance, decreased knowledge of precautions, decreased knowledge of use of DME, vision, and UE functional use, cognitive skills including memory and thought, and psychosocial skills including coping strategies, environmental adaptation, habits, interpersonal interactions, and routines and behaviors.   IMPAIRMENTS: are limiting patient from ADLs, IADLs, work, play, leisure, and social participation.   CO-MORBIDITIES: may have co-morbidities  that affects  occupational performance. Patient will benefit from skilled OT to address above impairments and improve overall function.  MODIFICATION OR ASSISTANCE TO COMPLETE EVALUATION: No modification of tasks or assist necessary to complete an evaluation.  OT OCCUPATIONAL PROFILE AND HISTORY: Detailed assessment: Review of records and additional review of physical, cognitive, psychosocial history related to current functional performance.  CLINICAL DECISION MAKING: LOW - limited treatment options, no task modification necessary  REHAB POTENTIAL: Good  EVALUATION COMPLEXITY: Low    PLAN:  OT FREQUENCY: 2x/week plus eval  OT DURATION: 12 weeks  PLANNED INTERVENTIONS: self care/ADL training, therapeutic exercise, therapeutic activity, neuromuscular re-education, manual therapy, passive range of motion, functional mobility training, splinting, electrical stimulation, ultrasound, paraffin, moist heat, cryotherapy, contrast bath, patient/family education, cognitive remediation/compensation, visual/perceptual remediation/compensation, energy conservation, coping strategies training, DME and/or AE instructions, and Re-evaluation  RECOMMENDED OTHER SERVICES: PT  CONSULTED AND AGREED WITH PLAN OF CARE: Patient and family member/caregiver  PLAN FOR NEXT SESSION:   functional use of LUE, work towards unmet goals 02/10/2023, 4:30 PM

## 2023-02-11 ENCOUNTER — Ambulatory Visit: Payer: BC Managed Care – PPO | Admitting: Occupational Therapy

## 2023-02-13 ENCOUNTER — Other Ambulatory Visit: Payer: Self-pay | Admitting: Family Medicine

## 2023-02-17 ENCOUNTER — Encounter: Payer: Self-pay | Admitting: Family Medicine

## 2023-02-17 ENCOUNTER — Ambulatory Visit: Payer: BC Managed Care – PPO | Admitting: Family Medicine

## 2023-02-17 VITALS — BP 124/80 | HR 90 | Temp 97.5°F | Ht 60.75 in | Wt 110.0 lb

## 2023-02-17 DIAGNOSIS — R2689 Other abnormalities of gait and mobility: Secondary | ICD-10-CM

## 2023-02-17 DIAGNOSIS — H9203 Otalgia, bilateral: Secondary | ICD-10-CM | POA: Diagnosis not present

## 2023-02-17 DIAGNOSIS — G802 Spastic hemiplegic cerebral palsy: Secondary | ICD-10-CM | POA: Diagnosis not present

## 2023-02-17 DIAGNOSIS — R051 Acute cough: Secondary | ICD-10-CM | POA: Diagnosis not present

## 2023-02-17 DIAGNOSIS — R509 Fever, unspecified: Secondary | ICD-10-CM | POA: Diagnosis not present

## 2023-02-17 LAB — POC COVID19 BINAXNOW: SARS Coronavirus 2 Ag: NEGATIVE

## 2023-02-17 MED ORDER — AMOXICILLIN-POT CLAVULANATE 875-125 MG PO TABS: 1.0000 | ORAL_TABLET | Freq: Two times a day (BID) | ORAL | 0 refills | Status: AC

## 2023-02-17 MED ORDER — BENZONATATE 200 MG PO CAPS: 200.0000 mg | ORAL_CAPSULE | Freq: Two times a day (BID) | ORAL | 0 refills | Status: DC | PRN

## 2023-02-17 NOTE — Patient Instructions (Signed)
The following information is provided as a general resource for ADULT patients only and does NOT take into account PREGNANCY, ALLERGIES, LIVER CONDITIONS, KIDNEY CONDITIONS, GASTROINTESTINAL CONDITIONS, OR PRESCRIPTION MEDICATION INTERACTIONS. Please be sure to ask your provider if the following are safe to take with your specific medical history, conditions, or current medication regimen if you are unsure.   Adult Basic Symptom Management for Sinusitis  Congestion: Guaifenesin (Mucinex)- follow directions on packaging with a maximum dose of 2400mg in a 24 hour period.  Pain/Fever: Ibuprofen 200mg - 400mg every 4-6 hours as needed (MAX 1200mg in a 24 hour period) Pain/Fever: Tylenol 500mg -1000mg every 6-8 hours as needed (MAX 3000mg in a 24 hour period)  Cough: Dextromethorphan (Delsym)- follow directions on packing with a maximum dose of 120mg in a 24 hour period.  Nasal Stuffiness: Saline nasal spray and/or Nettie Pot with sterile saline solution  Runny Nose: Fluticasone nasal spray (Flonase) OR Mometasone nasal spray (Nasonex) OR Triamcinolone Acetonide nasal spray (Nasacort)- follow directions on the packaging  Pain/Pressure: Warm washcloth to the face  Sore Throat: Warm salt water gargles  If you have allergies, you may also consider taking an oral antihistamine (like Zyrtec or Claritin) as these may also help with your symptoms.  **Many medications will have more than one ingredient, be sure you are reading the packaging carefully and not taking more than one dose of the same kind of medication at the same time or too close together. It is OK to use formulas that have all of the ingredients you want, but do not take them in a combined medication and as separate dose too close together. If you have any questions, the pharmacist will be happy to help you decide what is safe.    

## 2023-02-17 NOTE — Progress Notes (Signed)
Acute Office Visit  Subjective:     Patient ID: Kerri Robertson, female    DOB: 2000/07/18, 22 y.o.   MRN: [id number]  Chief Complaint  Patient presents with   Cough   Fever    HPI Patient is in today for cough, fevers, ear fullness.    Discussed the use of AI scribe software for clinical note transcription with the patient, who gave verbal consent to proceed.  History of Present Illness   The patient, with a recent history of exposure to a sick contact, presents with a constellation of symptoms that began approximately three to four days ago. The primary complaints are high-grade fevers reaching up to 100.6 degrees Fahrenheit, a persistent cough, and a sensation of ear fullness with muffled hearing. The fevers are responsive to Tylenol, but the cough remains severe and productive, with no reported hemoptysis. The coughing spells are so intense that they trigger a gag reflex and have led to left-sided chest discomfort. The patient also reports a sensation of ear fullness and muffled hearing, which has not improved with the use of Debrox.  In addition to these symptoms, the patient has experienced multiple falls recently - history of gait instability and balance problems related to her CP . However, there is no mention of associated injuries or trauma. The patient denies any sore throat, body aches, or chills. Despite the severity of the symptoms, the patient has not experienced any shortness of breath or pain on deep inspiration. The patient's condition has led to significant fatigue and reduced activity levels.  The patient has attempted over-the-counter cough suppressants, such as Delsym, with limited relief. The patient has also used Debrox for the ear fullness, which has provided some relief but has not resolved the symptoms. The patient has tested negative for COVID-19 at home twice. The patient denies any past medical history of pneumonia.            ROS All review of systems  negative except what is listed in the HPI      Objective:    BP 124/80   Pulse 90   Temp (!) 97.5 F (36.4 C) (Oral)   Ht 5' 0.75" (1.543 m)   Wt 110 lb (49.9 kg)   SpO2 96%   BMI 20.96 kg/m    Physical Exam Vitals reviewed.  Constitutional:      General: She is not in acute distress.    Appearance: Normal appearance. She is normal weight. She is not ill-appearing.  HENT:     Head: Normocephalic and atraumatic.     Right Ear: There is impacted cerumen.     Left Ear: There is impacted cerumen.     Ears:     Comments: After ear irrigation, right TM appears white, no obvious foreign body    Nose: Congestion and rhinorrhea present.  Cardiovascular:     Rate and Rhythm: Normal rate and regular rhythm.     Pulses: Normal pulses.     Heart sounds: Normal heart sounds.  Pulmonary:     Effort: Pulmonary effort is normal.     Breath sounds: Normal breath sounds.  Musculoskeletal:     Cervical back: Normal range of motion and neck supple. No tenderness.     Right lower leg: No edema.     Left lower leg: No edema.     Comments: LUE contracture, LLE mild malalignment; left hemiparesis/spasticity   Lymphadenopathy:     Cervical: No cervical adenopathy.  Skin:  General: Skin is warm and dry.  Neurological:     Mental Status: She is alert and oriented to person, place, and time. Mental status is at baseline.     Cranial Nerves: No cranial nerve deficit.  Psychiatric:        Mood and Affect: Mood normal.        Behavior: Behavior normal.        Thought Content: Thought content normal.        Judgment: Judgment normal.       Results for orders placed or performed in visit on 02/17/23  POC COVID-19 BinaxNow  Result Value Ref Range   SARS Coronavirus 2 Ag Negative Negative        Assessment & Plan:   Problem List Items Addressed This Visit       Active Problems   CP (cerebral palsy) ([name]) (Chronic)   Relevant Orders   Ambulatory referral to Physical Therapy    Balance problem   Relevant Orders   Ambulatory referral to Physical Therapy   Other Visit Diagnoses     Acute cough    -  Primary   Relevant Medications   amoxicillin-clavulanate (AUGMENTIN) 875-125 MG tablet   benzonatate (TESSALON) 200 MG capsule   Other Relevant Orders   POC COVID-19 BinaxNow (Completed)   Fever, unspecified fever cause       Relevant Orders   POC COVID-19 BinaxNow (Completed)   Otalgia of both ears       Relevant Medications   amoxicillin-clavulanate (AUGMENTIN) 875-125 MG tablet      Persistent high fevers, cough, and ear congestion. No sore throat or body aches. Cough triggers gag reflex. No hemoptysis. -COVID negative -Given presentation, will treat empirically with Augmentin and Tessalon. Advised that if viral, ABX will not be effective and supportive measures are encouraged. Patient aware of signs/symptoms requiring further/urgent evaluation. Follow-up if not improving  Continue supportive measures including rest, hydration, humidifier use, steam showers, warm compresses to sinuses, warm liquids with lemon and honey, and over-the-counter cough, cold, and analgesics as needed.        Meds ordered this encounter  Medications   amoxicillin-clavulanate (AUGMENTIN) 875-125 MG tablet    Sig: Take 1 tablet by mouth 2 (two) times daily for 7 days.    Dispense:  14 tablet    Refill:  0    Order Specific Question:   Supervising Provider    Answer:   Danise Edge A [[id number]]   benzonatate (TESSALON) 200 MG capsule    Sig: Take 1 capsule (200 mg total) by mouth 2 (two) times daily as needed for cough.    Dispense:  20 capsule    Refill:  0    Order Specific Question:   Supervising Provider    Answer:   Danise Edge A [[id number]]    Return if symptoms worsen or fail to improve.  Clayborne Dana, NP

## 2023-02-18 ENCOUNTER — Encounter: Payer: Self-pay | Admitting: Family Medicine

## 2023-02-18 ENCOUNTER — Ambulatory Visit: Payer: BC Managed Care – PPO | Admitting: Occupational Therapy

## 2023-02-18 DIAGNOSIS — R051 Acute cough: Secondary | ICD-10-CM

## 2023-02-18 MED ORDER — PREDNISONE 20 MG PO TABS
20.0000 mg | ORAL_TABLET | Freq: Every day | ORAL | 0 refills | Status: AC
Start: 2023-02-18 — End: 2023-02-23

## 2023-02-18 MED ORDER — ALBUTEROL SULFATE (2.5 MG/3ML) 0.083% IN NEBU
2.5000 mg | INHALATION_SOLUTION | Freq: Four times a day (QID) | RESPIRATORY_TRACT | 1 refills | Status: DC | PRN
Start: 2023-02-18 — End: 2024-02-25

## 2023-02-19 ENCOUNTER — Encounter: Payer: Self-pay | Admitting: Family Medicine

## 2023-02-24 ENCOUNTER — Ambulatory Visit: Payer: BC Managed Care – PPO | Admitting: Occupational Therapy

## 2023-02-24 DIAGNOSIS — M6281 Muscle weakness (generalized): Secondary | ICD-10-CM

## 2023-02-24 DIAGNOSIS — R2681 Unsteadiness on feet: Secondary | ICD-10-CM

## 2023-02-24 DIAGNOSIS — R278 Other lack of coordination: Secondary | ICD-10-CM

## 2023-02-24 DIAGNOSIS — I69954 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: Secondary | ICD-10-CM

## 2023-02-24 DIAGNOSIS — R293 Abnormal posture: Secondary | ICD-10-CM

## 2023-02-24 NOTE — Patient Instructions (Signed)
ELBOW: Extension / Chest Press (Frame)    Lie on back with knees bent. Straighten elbows to  raise cane ( you used a piece of small PVC pipe in the clinic.) Hold __3_ seconds.. _10__ reps per set, __1_ sets per day, __7_ days per week Shoulder: Flexion (Supine)    With hands shoulder width apart, slowly lower dowel to eye level Do not let elbows bend. Keep back flat. Hold ___3-5_ seconds. Repeat __10__ times. Do __1__ sessions per day. CAUTION: Stretch slowly and gently.   Continue to practice picking up blocks or small objects, you can perform wearing blue splint or without any splint   Wear your new resting hand splint for 1-2 hours at a time when resting and watching TV. Stop wearing if pain or pressure areas. You may want to alternate with blue thumb splint every other day as the blue splint targets your wrist. If pain or pressure areas stop wearing splint.

## 2023-02-24 NOTE — Therapy (Addendum)
OUTPATIENT OCCUPATIONAL THERAPY NEURO  treatment  Patient Name: Kerri Robertson MRN: [id number] DOB:05-Sep-2000, 22 y.o., female Today's Date: 02/24/2023  PCP: none REFERRING PROVIDER: Dr. Reola Calkins  OCCUPATIONAL THERAPY DISCHARGE SUMMARY   Current functional level related to goals / functional outcomes: Pt made overall progress however she di not fully achieve all goals due to significant weakness and spasticity.   Remaining deficits: Decreased strength, decreased coordination, decreased LUE functional use, decreased balance, decreased short term memory   Education / Equipment: Pt was educated in HEP and splint wear, care and precautions. She verbalized understanding of all education.  Patient agrees to discharge. Patient goals were partially met. Patient is being discharged due to maximized rehab potential at this time. Pt may benefit from additional OT in the future.    END OF SESSION:  OT End of Session - 02/24/23 1537     Visit Number 14    Number of Visits 25    Date for OT Re-Evaluation 03/11/23    Authorization Type BCBS/Medicaid    Authorization Time Period 12 weeks    OT Start Time 1448    OT Stop Time 1529    OT Time Calculation (min) 41 min    Activity Tolerance Patient tolerated treatment well    Behavior During Therapy WFL for tasks assessed/performed                   Past Medical History:  Diagnosis Date   Allergy    Cerebral palsy ([name])    CP (cerebral palsy) ([name])    Stroke ([name])    Past Surgical History:  Procedure Laterality Date   ARM SURGERY     2013   FOOT SURGERY     2010   HAMSTRING LENGTHENING     TIBIA / FIBIA LENGTHENING     Patient Active Problem List   Diagnosis Date Noted   Bilateral impacted cerumen 09/30/2022   Migraine without aura and without status migrainosus, not intractable 06/05/2022   CP (cerebral palsy) ([name]) 06/04/2022   Patellofemoral pain syndrome of left knee 06/03/2022   Knee cartilage,  torn, left 05/21/2022   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   Balance problem 05/06/2018   Syncope 05/06/2018   History of cerebrovascular accident 11/18/2017   Anxiety state 06/25/2017   Spasticity 06/25/2017   Allergic rhinitis 10/21/2016   Mild intermittent asthma 10/21/2016    ONSET DATE: 12/01/22  REFERRING DIAG: G80.2 (ICD-10-CM) - Spastic hemiplegic cerebral palsy ([name])  THERAPY DIAG:  Muscle weakness (generalized)  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)  Other lack of coordination  Abnormal posture  Unsteadiness on feet  Rationale for Evaluation and Treatment: Rehabilitation  SUBJECTIVE:   SUBJECTIVE STATEMENT: Pt reports she has been sick and has not worn her splint consistently  PERTINENT HISTORY: 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 MVH:QIONGEX of multiple surgeries, particularly on the upper and lower extremities, generalized weakness, more pronounced on the left side, and a contracture in the left arm   PRECAUTIONS: Fall  WEIGHT BEARING RESTRICTIONS: No  PAIN:  Are you having pain? No  FALLS: Has patient fallen in last 6 months? Yes.   LIVING ENVIRONMENT: Lives with: lives with their family Lives in: House/apartment   PLOF: Needs assistance with ADLs and Needs assistance with homemaking  PATIENT GOALS: increase LUE functional use  OBJECTIVE:  HAND DOMINANCE: Right  ADLs:Pt reports difficulty with the following tasks; button jeans, donn/ doff earrings, open water bottles Pt/ mom report she uses LUE only 5% of the time. Overall ADLs: increased time required Transfers/ambulation related to ADLs:mod I with hx of falls Eating: needs assist with cutting food , difficulty with spreading cream cheese Grooming: needs assist with brushing and putty hair in a pony tail difficulty opening mascara and  lip gloss UB Dressing: mod I  LB Dressing: needs assist  with tying and getting shoes on Toileting: mod I Bathing: needs assist with washing hair, difficulty washing R side Tub Shower transfers:  has sliding tub bench , supervision Equipment: Transfer tub bench  IADLs: Shopping: need assist for  Light housekeeping: does laundry , cleans bathroom, dusts  Meal Prep: pt makes mac and cheese, garlic  bread in frying pan  however pt needs tortilla bag Community mobility: mod I Medication management: needs reminders, pt sorts  R handed   MOBILITY STATUS: Independent    UPPER EXTREMITY ROM:    Active ROM Right eval Left eval  Shoulder flexion  105  Shoulder abduction    Shoulder adduction    Shoulder extension    Shoulder internal rotation    Shoulder external rotation    Elbow flexion  125  Elbow extension  -35  Wrist flexion  -65 at rest  Wrist extension  -65/-15 P/ROM  Wrist ulnar deviation    Wrist radial deviation    Wrist pronation    Wrist supination  Unable/ 75% P/ROM  (Blank rows = not tested)  HAND FUNCTION: Grip strength: Right: NT lbs; Left: NT lbs  COORDINATION: LUE is impaired pt was able to pick up a block 1/3 trials to place in container  SENSATION: Not tested    MUSCLE TONE: LUE: Mild and Hypertonic  COGNITION: Overall cognitive status:  decreased short term memory per PT report    VISION ASSESSMENT: Not tested     OBSERVATIONS: Pt is motivated  and pleasant .   TODAY'S TREATMENT:                                                                                                                              DATE:02/24/24-Supine on mat closed chain chest press and shoulder flexion with bilateral UE's 10-20 reps min v.c holding narrow PVC pipe Seated edge of mat lateral trunk flexion with LUE supported on ball, min facilitation v.c Standing facing mat rolling medium ball forwards and backwards with bilateral shoulder flexion followed by  resting abdomen over ball for gentle shoulder extension, min facilitation/ v.c Seated at table functional grasp/ release of wooden dowel pegs to remove from pegboard, min difficulty/ v.c Replacing pegs with mod-max difficulty. Therapist checked printed out HEP and splint wear instructions. See pt instructions   02/10/23 Therapist administered Fugl- Meyer. Pt scored 15/56. Pt does not qualify for vivistim based upon this score, minimum score is 20/56 to qualify. Supine on  mat closed chain shoulder flexion and chest press with PVC pipe, min facilitation/v.c Standing at table rolling small ball forwards and backwards with bilateral UE's, followed by table slides forwards and backwards and side to side, min v.c Weightbearing through left elbow with body on arm movements, min v.c and facilitation. Removing wooden dowel pegs from pegboard for increased LUE functional use, min difficulty/ v.c  02/09/23- Pt arrived wearing her splint. She reports she has not had many opportunities to wear her splint. Pt reports she has had splint on for approximately 45 mins without issues, and pt's hand seems looser after removing. Pt picked up several blocks  with min-mod difficulty. Pt then transitioned to removing wooden dowel pegs from pegboard. Pt performed this with increased ease today after wearing splint, min difficulty/ v.c  Pt did not require facilitation. Cane exercises for shoulder flexion reaching towards floor and chest press, min v.c and facilitation. Pt then worked to replace pegs with mod difficulty and min facilitation. Note sent with pt's to her mom regarding plans to add 1 additional visit, to complete Fugl-Meyer and that pt may benefit from additional therapy in 4-6 months.  02/03/23--Pt performed tableslides for gentle stretch for shoulder flexion, abduction. Therapist completed fabrication and fitting of a custom resting hand splint for improved positioning of digits and thumb. Pt was instructed in  splint wear, care and precautions. Pt's hired caregiver was present. Pt took pictures on her phone of correct positioning. Pt wore the splint for approximately 8-10 mins while therapist provided instructions.  02/02/23-Pt performed tableslides for gentle stretch for shoulder flexion, abduction. Functional grasp/ release of wooden dowel pegs to remove and replace in pegboard, min-mod difficulty.Therapist started fabrication of a custom resting hand splint for improved stretch to left thumb. Splint was not issued due to time constraints.  01/28/23- Supine on mat, gentle passive stretch to LUE in abduction, and shoulder flexion, while facilitating scapula.Supine on mat holding PVC pipe, pt performed chest press, and shoulder flexion with min-mod facilitation/ v.c for left arm and scapula positioning while wearing thumb spica (benke)splint  Therapist checked progress towards short term goals. Pt was able to grasp and pick up/ release a 1 inch block today, 4/5 trials with stretching in between. Functional grasp/ release of wooden dowel pegs to place on target, mod difficulty/ v.c Note sent to patients mother regarding potential custom resting splint, plans for the Fugl-meyer and potential break from therapy in October   01/21/23- supine on mat passive stretch to LUE in abduction and elbow extension, while pt performed lower trunk rotation. Seated edge of mat weightbearing through left elbow over ball with body on arm movements and lateral trunk flexion, and performing diagonals with RUE, min-mod facilitation/ v.c Supine on mat holding PVC pipe, pt performed chest press, and shoulder flexion with min-mod facilitation/ v.c for left arm and scapula positioning while wearing thumb spica (benke)splint  Self stretch elbow extension reaching for the floor. Standing at table wearing splint for weightbearing through ball for circumduction and rolling forwards and back with bilateral UE's, mood facilitation. Drumming  with LUE to hit target mod facilitation for LUE forearm rotation, elbow flexion/ extension. Arm, bike x 3 mins with hand wrapped mod facilitation Pt requests to leave early for voice class.   01/19/23-Seated on mat gentle passive stretch to left forearm in supination, and digits in extension.  Pt performed self stretch shoulder flexion reaching for floor, min v.c Seated edge of mat weightbearing through left elbow over ball with body on  arm movements and lateral trunk flexion, and performing diagonals with RUE, min-mod facilitation/ v.c Supine on mat holding PVC pipe, pt performed chest press, and shoulder flexion with min-mod facilitation/ v.c for left arm and scapula positioning while wearing thumb spica (benke)splint  Pt's reports she has not attempted at home. Seated at table pt performed shoulder flexion and abduction table slides , 10 reps each min v.c  Functional grasp/ release of wooden dowel pegs while wearing splint, min-mod facilitation/ v.c, to remove and replace pegs with emphasis on more normal movement patterns while wearing splint. Pt reports improving ability to use her left hand for grasp/ release.  01/14/23 Seated edge of mat weightbearing through left elbow over ball with body on arm movements and lateral trunk flexion, and performing diagonals with RUE, min-mod facilitation/ v.c Pt's thumb spica portion of the Beneke splint was applied then pt performed the following exercise. Supine on mat holding PVC pipe, pt performed chest press, and shoulder flexion with min-mod facilitation/ v.c. Pt's caregiver took a video so that pt can perform at home. Pt/ caregiver verbalize understanding Seated at table pt performed shoulder flexion and abduction table slides min v.c  Functional grasp/ release of wooden dowel pegs while wearing splint, min-mod facilitation/ v.c Pt practiced using bilateral UE's to open an water bottle with mod facilitation.Pt requested to leave early for voice  lessons.   01/12/23-Pt reports wearing splint 30 mins-1 hour at home without issues.Seated edge of mat weightbearing through left elbow over foam roll, with body on arm movements and lateral trunk flexion, min-mod facilitation/ v.c Supine, self ROM shoulder flexion with therapist facilitating scapula, min facilitation/ v.c Supine, passive shoulder abduction stretch with lower trunk rotation, min v.c and mod facilitation. Wearing thumb portion of Beneke splint, pt performed activities to increase functional use of LUE: functional grasp/ release of cones, picking up 1 inch blocks to place in container, and folding wash cloths, mod facilitation/ v.c passive stretch to forearm and digits prior to task. 01/07/23 Pt brought in her previously issued pre-fab splints which she has not been wearing consistently. Pt donned the thumb spica sleeve first. Gentle passive stretch to digits followed by grasp/ release of wooden dowel pegs while wear splint and without, min-mod facilitation/ v.c. Thumb spica portion appears to assist with grasp. Therapist then assessed the pan / resting splint portion. It appears to fit well and pt was encouraged to wear for 30 mins- 1 hour daily when watching TV.   12/30/22-sidelying on mat, therapist performed scapular mobs, followed by supine, pt self ROM shoulder flexion with therapist facilitating scapula. Seated edge of mat weightbearing through left elbow over ball with body on arm movements and lateral trunk flexion, followed by thread the needle min facilitation/ v.c, (Pt repeated with elbow supported on 2 pillows) Prone on elbows, lifting chest for weightbearing and scapular activation, min facilitation/ v.c. Quadraped weightbearing through physio ball on elbows rolling forwards and backwards for shoulder flexion and weightbearing, min facilitation P/ROM forearm supination, then P/ROM finger and thumb extension Self stretch reach for the floor, min v.c Functional grasp/ release  of 1 inch blocks to place in container, mod facilitation/ v.c  12/29/22- Seated edge of mat weightbearing through left elbow over ball with body on arm movements and lateral trunk flexion, min-mod facilitation/ v.c Standing edge of mat weightbearing through physio ball on mat for shoulder flexion and weightbearing, min facilitation P/ROM forearm supination, then pt performed on herself with min v.c and demonstration P/ROM finger and thumb extension  Functional grasp/ release of wooden dowel pegs to remove from pegboard then to replace with min-mod facilitation/ v.c, mod difficulty. Pt simulated washing her legs with LUE using hand over hand assist with washcloth.       12/17/22- eval only   PATIENT EDUCATION: Education details:  HEP splint wear schedule Person educated: Patient and hired caregiver present Education method: Explanation, demonstration, v.c, handout Education comprehension: verbalized understanding, returned demonstration  HOME EXERCISE PROGRAM: Weightbearing and supine shoulder flexion   GOALS: Goals reviewed with patient? Yes  SHORT TERM GOALS: Target date: 01/16/23  I with initial HEP.  Goal status:  met, pt demonstrates understanding 01/19/23  2.  Pt will report using LUE as a gross assist at least 10% of the time for ADLs.   Goal status: not met, 5-10% not using consistently, 02/24/23  3.  Pt will demonstrate ability to pick up a 1 inch block with LUE 2/5 trials consistently.  Goal status:  met, 01/28/23 today 4/5 trials, with stretching in between trials  4.  I with positioning to minimize contracture including splinting prn  Goal status: met,  pt verbalizes understanding of splint wear and care.  5.  Pt will increase LUE A/ROM shoulder flexion to 110* for increased ease with ADLs. Baseline: 105 Goal status: partially met 110 with mod compensation  01/28/23  6.    LONG TERM GOALS: Target date: 03/11/23  I with updated HEP.  Goal status: met,  02/24/23  2.  Pt will report increased ease with fastening jeans, opening water bottles and donning/ doffing earrings.  Goal status:  not met, unable 02/24/23  3.  Pt will use LUE to assist with brushing her hair. Baseline:  Goal status: not met, 02/09/23  4.   Pt will report using LUE as a gross / active assist at least 15% of the time for ADLs.  Goal status:  not met 5-10% 02/24/23  5.  Pt will demonstrate -30 elbow extension in prep for functional reach.  Goal status: not consistent, 02/24/23  6. Assess for vivistim prn Baseline: met, Fugl- Meyer completed 02/10/23- score:15/ 56    ASSESSMENT:  CLINICAL IMPRESSION: Patient made progress towards goals. She report using her LUE more for functional activities. She did not fully achieve all goals due to spasticity and significant LUE weakness.   PERFORMANCE DEFICITS: in functional skills including ADLs, IADLs, coordination, dexterity, proprioception, sensation, tone, ROM, strength, flexibility, Fine motor control, Gross motor control, mobility, balance, endurance, decreased knowledge of precautions, decreased knowledge of use of DME, vision, and UE functional use, cognitive skills including memory and thought, and psychosocial skills including coping strategies, environmental adaptation, habits, interpersonal interactions, and routines and behaviors.   IMPAIRMENTS: are limiting patient from ADLs, IADLs, work, play, leisure, and social participation.   CO-MORBIDITIES: may have co-morbidities  that affects occupational performance. Patient will benefit from skilled OT to address above impairments and improve overall function.  MODIFICATION OR ASSISTANCE TO COMPLETE EVALUATION: No modification of tasks or assist necessary to complete an evaluation.  OT OCCUPATIONAL PROFILE AND HISTORY: Detailed assessment: Review of records and additional review of physical, cognitive, psychosocial history related to current functional  performance.  CLINICAL DECISION MAKING: LOW - limited treatment options, no task modification necessary  REHAB POTENTIAL: Good  EVALUATION COMPLEXITY: Low    PLAN:  OT FREQUENCY: 2x/week plus eval  OT DURATION: 12 weeks  PLANNED INTERVENTIONS: self care/ADL training, therapeutic exercise, therapeutic activity, neuromuscular re-education, manual therapy, passive range of motion, functional mobility training,  splinting, electrical stimulation, ultrasound, paraffin, moist heat, cryotherapy, contrast bath, patient/family education, cognitive remediation/compensation, visual/perceptual remediation/compensation, energy conservation, coping strategies training, DME and/or AE instructions, and Re-evaluation  RECOMMENDED OTHER SERVICES: PT  CONSULTED AND AGREED WITH PLAN OF CARE: Patient and family member/caregiver  PLAN FOR NEXT SESSION: d/c OT 02/24/2023, 3:38 PM

## 2023-03-01 ENCOUNTER — Ambulatory Visit (HOSPITAL_BASED_OUTPATIENT_CLINIC_OR_DEPARTMENT_OTHER)
Admission: RE | Admit: 2023-03-01 | Discharge: 2023-03-01 | Disposition: A | Discharge status: Home / Self Care | Payer: BC Managed Care – PPO | Source: Ambulatory Visit | Attending: Family Medicine | Admitting: Family Medicine

## 2023-03-01 ENCOUNTER — Encounter: Payer: Self-pay | Admitting: Family Medicine

## 2023-03-01 DIAGNOSIS — R051 Acute cough: Secondary | ICD-10-CM | POA: Insufficient documentation

## 2023-03-02 MED ORDER — AZITHROMYCIN 250 MG PO TABS
ORAL_TABLET | ORAL | 0 refills | Status: AC
Start: 2023-03-02 — End: 2023-03-07

## 2023-03-02 NOTE — Telephone Encounter (Signed)
See other thread of messages

## 2023-04-23 ENCOUNTER — Encounter: Payer: Self-pay | Admitting: Family Medicine

## 2023-05-21 ENCOUNTER — Other Ambulatory Visit: Payer: Self-pay | Admitting: Family Medicine

## 2023-07-09 ENCOUNTER — Encounter: Payer: Self-pay | Admitting: Family Medicine

## 2023-07-09 ENCOUNTER — Ambulatory Visit (INDEPENDENT_AMBULATORY_CARE_PROVIDER_SITE_OTHER): Admitting: Family Medicine

## 2023-07-09 VITALS — BP 122/79 | HR 99 | Temp 97.8°F | Ht 60.75 in | Wt 113.0 lb

## 2023-07-09 DIAGNOSIS — G800 Spastic quadriplegic cerebral palsy: Secondary | ICD-10-CM | POA: Diagnosis not present

## 2023-07-09 DIAGNOSIS — J069 Acute upper respiratory infection, unspecified: Secondary | ICD-10-CM

## 2023-07-09 DIAGNOSIS — J309 Allergic rhinitis, unspecified: Secondary | ICD-10-CM | POA: Diagnosis not present

## 2023-07-09 MED ORDER — FLUTICASONE PROPIONATE 50 MCG/ACT NA SUSP
2.0000 | Freq: Every day | NASAL | 6 refills | Status: AC
Start: 2023-07-09 — End: ?

## 2023-07-09 MED ORDER — CETIRIZINE HCL 10 MG PO TABS: 10.0000 mg | ORAL_TABLET | Freq: Every day | ORAL | 3 refills | Status: AC

## 2023-07-09 NOTE — Progress Notes (Signed)
 Established Patient Office Visit  Subjective   Patient ID: Kerri Robertson, female    DOB: Sep 14, 2000  Age: 23 y.o. MRN: [id number]  Chief Complaint  Patient presents with   Medical Management of Chronic Issues    HPI    Discussed the use of AI scribe software for clinical note transcription with the patient, who gave verbal consent to proceed.  History of Present Illness Kerri Robertson is a 23 year old female who presents for a new physical therapy referral and recent fever with allergy symptoms.  She experienced a fever of 100.47F on Saturday, preceded by feeling unwell on Friday. Significant fatigue was noted, with most of Saturday spent sleeping. Symptoms included coughing and sneezing, resembling cold symptoms, which have been improving. No upset stomach or further fevers have occurred since then. She has been taking cetirizine and Flonase for her symptoms and did not require Tylenol or ibuprofen to break the fever, as it resolved on its own. She has springtime allergies and uses Zyrtec and Flonase through the pollen season. Her symptoms have not returned or worsened in the past few days.  She is seeking a new referral for physical therapy, which is required periodically for insurance purposes. She has been attending the same physical therapy location and typically sees a therapist named Casimiro Needle, although she has seen different therapists at times. Her physical therapy primarily focuses on strength and balance exercises related to her cerebral palsy.          ROS All review of systems negative except what is listed in the HPI    Objective:     BP 122/79   Pulse 99   Temp 97.8 F (36.6 C) (Oral)   Ht 5' 0.75" (1.543 m)   Wt 113 lb (51.3 kg)   SpO2 99%   BMI 21.53 kg/m    Physical Exam Vitals reviewed.  Constitutional:      General: She is not in acute distress.    Appearance: Normal appearance. She is normal weight. She is not ill-appearing.  HENT:     Head:  Normocephalic and atraumatic.     Right Ear: Tympanic membrane normal.     Left Ear: Tympanic membrane normal.     Nose: Nose normal.     Mouth/Throat:     Mouth: Mucous membranes are moist.     Pharynx: Oropharynx is clear. No oropharyngeal exudate.  Cardiovascular:     Rate and Rhythm: Normal rate and regular rhythm.     Pulses: Normal pulses.     Heart sounds: Normal heart sounds.  Pulmonary:     Effort: Pulmonary effort is normal.     Breath sounds: Normal breath sounds.  Musculoskeletal:     Cervical back: Normal range of motion and neck supple. No tenderness.     Right lower leg: No edema.     Left lower leg: No edema.     Comments: LUE contracture, LLE mild malalignment; left hemiparesis/spasticity   Lymphadenopathy:     Cervical: No cervical adenopathy.  Skin:    General: Skin is warm and dry.  Neurological:     Mental Status: She is alert and oriented to person, place, and time. Mental status is at baseline.     Cranial Nerves: No cranial nerve deficit.  Psychiatric:        Mood and Affect: Mood normal.        Behavior: Behavior normal.        Thought Content: Thought content normal.  Judgment: Judgment normal.      No results found for any visits on 07/09/23.    The ASCVD Risk score (Arnett DK, et al., 2019) failed to calculate for the following reasons:   The 2019 ASCVD risk score is only valid for ages 48 to 79   Risk score cannot be calculated because patient has a medical history suggesting prior/existing ASCVD    Assessment & Plan:   Problem List Items Addressed This Visit       Active Problems   Allergic rhinitis   Relevant Medications   fluticasone (FLONASE) 50 MCG/ACT nasal spray   cetirizine (ZYRTEC) 10 MG tablet   Other Visit Diagnoses       CP (cerebral palsy), spastic, 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[name])    -  Primary   Relevant Orders   Ambulatory referral to Physical Therapy     Viral URI            Assessment & Plan Viral Upper  Respiratory Infection and seasonal allergies Symptoms consistent with viral infection, likely exacerbated by allergies. Resolved without significant symptoms recently. - Continue cetirizine and Flonase until June for allergies. - Maintain rest, hydration, and OTC medications as needed. - Wear mask if symptomatic around others.  Physical Therapy Referral Requires new referral for cerebral palsy - Send referral for physical therapy.       Return if symptoms worsen or fail to improve.    Clayborne Dana, NP

## 2023-07-13 NOTE — Therapy (Signed)
 OUTPATIENT PHYSICAL THERAPY NEURO EVALUATION   Patient Name: Kerri Robertson MRN: [id number] DOB:[birth year], 23 y.o., female Today's Date: 07/14/2023   PCP: Hyman Hopes REFERRING PROVIDER: Hyman Hopes  END OF SESSION:  PT End of Session - 07/14/23 1229     Visit Number 1    Date for PT Re-Evaluation 10/06/23    Authorization Type [employer]    PT Start Time 1230    PT Stop Time 1310    PT Time Calculation (min) 40 min             Past Medical History:  Diagnosis Date   Allergy    Cerebral palsy ([name])    CP (cerebral palsy) ([name])    Stroke ([name])    Past Surgical History:  Procedure Laterality Date   ARM SURGERY     2013   FOOT SURGERY     2010   HAMSTRING LENGTHENING     TIBIA / FIBIA LENGTHENING     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   Balance problem 05/06/2018   Syncope 05/06/2018   History of cerebrovascular accident 11/18/2017   Anxiety state 06/25/2017   Spasticity 06/25/2017   Allergic rhinitis 10/21/2016   Mild intermittent asthma 10/21/2016    ONSET DATE: 07/09/23- new referral  REFERRING DIAG:  G80.0 (ICD-10-CM) - CP (cerebral palsy), spastic, 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[name])      THERAPY DIAG:  Muscle weakness (generalized)  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 February was fine. This month, I had a fall last night. I am not sure how I feel but my legs ended up under a table. Only got hurt on my elbow.  Pt accompanied by:  caretaker   PERTINENT HISTORY: CP 07/09/23-- Rowen Hur is a 23 year old female who presents for a new physical therapy referral and recent fever with allergy symptoms. She is seeking a new referral for physical therapy, which is required periodically for insurance purposes. She has been attending the same physical therapy location and typically sees a therapist named Casimiro Needle, although she has seen different therapists at times. Her physical therapy primarily focuses on strength and balance exercises related to her cerebral palsy.    PAIN:  Are you having pain? No  PRECAUTIONS: Fall  RED FLAGS: None   WEIGHT BEARING RESTRICTIONS: No  FALLS: Has patient fallen in last 6 months? Yes. Number of falls 4-5  LIVING ENVIRONMENT: Lives with: lives with their family Lives in: House/apartment Stairs: No  PLOF: Independent with basic ADLs  PATIENT GOALS: to be able to get up from the floor, have less falls, work on balance and walking better  OBJECTIVE:  Note: Objective measures were completed at Evaluation unless otherwise noted.  DIAGNOSTIC FINDINGS: IMPRESSION: 1. The acetabulum is relatively shallow and the left femoral head demonstrates an unusual contour. These findings may be developmental secondary to the patient's cerebral palsy. 2. No other abnormalities.  COGNITION: Overall cognitive status: Within functional limits for tasks  assessed   SENSATION: WFL  COORDINATION: Decrease coordination d/t CP  MUSCLE TONE: LLE: Mild and Moderate  MUSCLE LENGTH: Hamstrings and hip flexors are tight on both sides.   POSTURE: rounded shoulders, left pelvic obliquity, and weight shift right  LOWER EXTREMITY ROM:  Trunk lateraly flexed due to scoliosis, L hip unable to achieve neutral ER, abd limited, B hamstrings tight, L ankle does not achieve neutral DF or inversion. Her L foot in turned inwards more, increased IR of hip. LUE with limited ROM at all joints, in all planes. She wears a splint on her wrist at night some nights.   LOWER EXTREMITY MMT:  MMT accuracy is questionable due to tone   MMT Right Eval Left Eval  Hip flexion 4 3+  Hip extension    Hip abduction 4 3+  Hip adduction 4 4-  Hip internal rotation    Hip external rotation    Knee flexion 4 3  Knee extension 4 4  Ankle dorsiflexion    Ankle plantarflexion    Ankle inversion    Ankle eversion    (Blank rows = not tested)  TRANSFERS: Assistive device utilized: None  Sit to stand: Modified independence Stand to sit: Modified independence Chair to chair: Modified independence Floor: Max A  CURB:  Level of Assistance: Modified independence Assistive device utilized: None  STAIRS: Level of Assistance: Modified independence Stair Negotiation Technique: Step to Pattern Alternating Pattern  with Single Rail on Right  GAIT: Gait pattern: step to pattern, decreased arm swing- Left, decreased step length- Left, decreased stance time- Left, decreased stride length, ataxic, trunk rotated posterior- Left, narrow BOS, and poor foot clearance- Left Distance walked: clinic distances Assistive device utilized: None Level of assistance: Modified independence  FUNCTIONAL TESTS:  5 times sit to stand: 14.63s Timed up and go (TUG): 13.06s  TREATMENT DATE: 07/14/23- EVAL     PATIENT EDUCATION: Education details: POC, reviewed HEP with her and Sheppard Penton her caretaker today Person educated: Patient and Caregiver Sheppard Penton Education method: Explanation Education comprehension: verbalized understanding  HOME EXERCISE PROGRAM: [id number]   GOALS: Goals reviewed with patient? Yes  SHORT TERM GOALS: Target date: 08/25/23  Patient will be independent with initial HEP. Baseline: has an old HEP but not doing Goal status: INITIAL  2.  Patient will demonstrate decreased fall risk by scoring < 12 sec on 5xSTS. Baseline: 14.63s Goal status: INITIAL   LONG TERM GOALS: Target date: 10/06/23  Patient will be independent with advanced/ongoing HEP to improve outcomes and carryover.  Baseline:  Goal status: INITIAL  2.  Patient will be able to complete floor transfer with minA   Baseline: mod-maxA needed when she falls Goal status: INITIAL  3.  Patient will report no falls in a 4 week period   Baseline: "fell last night" 07/14/23 Goal status: INITIAL   4.  Patient will demonstrate improved functional LE strength as demonstrated by 4/5 in LLE. Baseline: see chart Goal status: INITIAL  5.  Patient will ambulate with more normalized gait pattern and less internal rotation of LLE  Baseline: caretakers reports her LLE in turning in more than usual  Goal status: INITIAL   ASSESSMENT:  CLINICAL IMPRESSION: Patient is a 23 y.o. female who was seen today for physical therapy evaluation and treatment for CP. She is a returning patient who has no real changes but has had an increase in numbers of falls and has noticed that her LLE turns inwards more than it used to. Her LLE is weak but it is hard to tell how accurate it is due to her tone. Patient will benefit from ongoing strength training and balance to decrease her risk for falls. She will also benefit from stretching to increase her flexibility, especially in her hips.   OBJECTIVE IMPAIRMENTS: Abnormal gait,  decreased balance, decreased coordination, decreased endurance, decreased ROM, decreased strength, impaired tone, impaired UE functional use, and postural dysfunction.   ACTIVITY LIMITATIONS: squatting, stairs, transfers, and locomotion level  PERSONAL FACTORS: Age, Behavior pattern, Past/current experiences, and Time since onset of injury/illness/exacerbation are also affecting patient's functional outcome.   REHAB POTENTIAL: Fair chronicity of symptoms  CLINICAL DECISION MAKING: Stable/uncomplicated  EVALUATION COMPLEXITY: Low  PLAN:  PT FREQUENCY: 2x/week  PT DURATION: 12 weeks  PLANNED INTERVENTIONS: 97110-Therapeutic exercises, 97530- Therapeutic activity, 97112- Neuromuscular re-education, 97535- Self Care, 16109- Manual therapy, [id number]- Gait training, Patient/Family education, Balance training, Stair training, Taping, Dry Needling, Joint mobilization, Spinal mobilization, Cryotherapy, and Moist heat  PLAN FOR NEXT SESSION: LE stretching, strengthening hip abd, balance, functional strengthening   Cassie Freer, PT 07/14/2023, 1:06 PM

## 2023-07-14 ENCOUNTER — Ambulatory Visit: Attending: Family Medicine

## 2023-07-14 DIAGNOSIS — M6281 Muscle weakness (generalized): Secondary | ICD-10-CM | POA: Diagnosis present

## 2023-07-14 DIAGNOSIS — G800 Spastic quadriplegic cerebral palsy: Secondary | ICD-10-CM | POA: Diagnosis present

## 2023-07-14 DIAGNOSIS — I69954 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: Secondary | ICD-10-CM | POA: Diagnosis present

## 2023-07-14 DIAGNOSIS — R531 Weakness: Secondary | ICD-10-CM | POA: Diagnosis present

## 2023-07-14 DIAGNOSIS — G801 Spastic diplegic cerebral palsy: Secondary | ICD-10-CM | POA: Diagnosis present

## 2023-07-14 DIAGNOSIS — R2681 Unsteadiness on feet: Secondary | ICD-10-CM | POA: Diagnosis present

## 2023-07-14 DIAGNOSIS — R293 Abnormal posture: Secondary | ICD-10-CM | POA: Diagnosis present

## 2023-07-14 DIAGNOSIS — R262 Difficulty in walking, not elsewhere classified: Secondary | ICD-10-CM | POA: Insufficient documentation

## 2023-07-14 DIAGNOSIS — R278 Other lack of coordination: Secondary | ICD-10-CM | POA: Diagnosis present

## 2023-07-14 DIAGNOSIS — R2689 Other abnormalities of gait and mobility: Secondary | ICD-10-CM | POA: Insufficient documentation

## 2023-07-16 ENCOUNTER — Encounter: Payer: Self-pay | Admitting: Physical Therapy

## 2023-07-16 ENCOUNTER — Ambulatory Visit: Admitting: Physical Therapy

## 2023-07-16 DIAGNOSIS — R2689 Other abnormalities of gait and mobility: Secondary | ICD-10-CM

## 2023-07-16 DIAGNOSIS — G801 Spastic diplegic cerebral palsy: Secondary | ICD-10-CM

## 2023-07-16 DIAGNOSIS — R293 Abnormal posture: Secondary | ICD-10-CM

## 2023-07-16 DIAGNOSIS — I69954 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: Secondary | ICD-10-CM

## 2023-07-16 DIAGNOSIS — R531 Weakness: Secondary | ICD-10-CM

## 2023-07-16 DIAGNOSIS — R262 Difficulty in walking, not elsewhere classified: Secondary | ICD-10-CM

## 2023-07-16 DIAGNOSIS — R278 Other lack of coordination: Secondary | ICD-10-CM

## 2023-07-16 DIAGNOSIS — M6281 Muscle weakness (generalized): Secondary | ICD-10-CM

## 2023-07-16 DIAGNOSIS — R2681 Unsteadiness on feet: Secondary | ICD-10-CM

## 2023-07-16 DIAGNOSIS — G800 Spastic quadriplegic cerebral palsy: Secondary | ICD-10-CM

## 2023-07-16 NOTE — Therapy (Signed)
 OUTPATIENT PHYSICAL THERAPY NEURO EVALUATION   Patient Name: Kerri Robertson MRN: [id number] DOB:Sep 19, 2000, 23 y.o., female Today's Date: 07/16/2023   PCP: Hyman Hopes REFERRING PROVIDER: Hyman Hopes  END OF SESSION:  PT End of Session - 07/16/23 1358     Visit Number 2    Date for PT Re-Evaluation 10/06/23    Authorization Type [employer]    PT Start Time 1356    PT Stop Time 1440    PT Time Calculation (min) 44 min    Activity Tolerance Patient tolerated treatment well    Behavior During Therapy WFL for tasks assessed/performed             Past Medical History:  Diagnosis Date   Allergy    Cerebral palsy ([name])    CP (cerebral palsy) ([name])    Stroke [facility])    Past Surgical History:  Procedure Laterality Date   ARM SURGERY     2013   FOOT SURGERY     2010   HAMSTRING LENGTHENING     TIBIA / FIBIA LENGTHENING     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   Balance problem 05/06/2018   Syncope 05/06/2018   History of cerebrovascular accident 11/18/2017   Anxiety state 06/25/2017   Spasticity 06/25/2017   Allergic rhinitis 10/21/2016   Mild intermittent asthma 10/21/2016    ONSET DATE: 07/09/23- new referral  REFERRING DIAG:  G80.0 (ICD-10-CM) - CP (cerebral palsy), spastic, 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[name])      THERAPY DIAG:  Muscle weakness (generalized)  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)  Other lack of coordination  Abnormal posture  Unsteadiness on feet  Other abnormalities of gait and mobility  Spastic quadriplegic cerebral palsy  ([name])  Difficulty in walking, not elsewhere classified  Cerebral palsy with spastic diplegia ([name])  Left-sided weakness  Rationale for Evaluation and Treatment: Rehabilitation  SUBJECTIVE:                                                                                                                                                                                             SUBJECTIVE STATEMENT: Reports no new falls, denies pain  In January I had a lot of fall, it was like 3. February was fine. This month, I  had a fall last night. I am not sure how I feel but my legs ended up under a table. Only got hurt on my elbow.   Pt accompanied by:  caretaker   PERTINENT HISTORY: CP 07/09/23-- Kerri Robertson is a 23 year old female who presents for a new physical therapy referral and recent fever with allergy symptoms. She is seeking a new referral for physical therapy, which is required periodically for insurance purposes. She has been attending the same physical therapy location and typically sees a therapist named Casimiro Needle, although she has seen different therapists at times. Her physical therapy primarily focuses on strength and balance exercises related to her cerebral palsy.    PAIN:  Are you having pain? No  PRECAUTIONS: Fall  RED FLAGS: None   WEIGHT BEARING RESTRICTIONS: No  FALLS: Has patient fallen in last 6 months? Yes. Number of falls 4-5  LIVING ENVIRONMENT: Lives with: lives with their family Lives in: House/apartment Stairs: No  PLOF: Independent with basic ADLs  PATIENT GOALS: to be able to get up from the floor, have less falls, work on balance and walking better  OBJECTIVE:  Note: Objective measures were completed at Evaluation unless otherwise noted.  DIAGNOSTIC FINDINGS: IMPRESSION: 1. The acetabulum is relatively shallow and the left femoral head demonstrates an unusual contour. These findings may be developmental secondary to the patient's cerebral  palsy. 2. No other abnormalities.  COGNITION: Overall cognitive status: Within functional limits for tasks assessed   SENSATION: WFL  COORDINATION: Decrease coordination d/t CP  MUSCLE TONE: LLE: Mild and Moderate  MUSCLE LENGTH: Hamstrings and hip flexors are tight on both sides.   POSTURE: rounded shoulders, left pelvic obliquity, and weight shift right  LOWER EXTREMITY ROM:  Trunk lateraly flexed due to scoliosis, L hip unable to achieve neutral ER, abd limited, B hamstrings tight, L ankle does not achieve neutral DF or inversion. Her L foot in turned inwards more, increased IR of hip. LUE with limited ROM at all joints, in all planes. She wears a splint on her wrist at night some nights.   LOWER EXTREMITY MMT:  MMT accuracy is questionable due to tone   MMT Right Eval Left Eval  Hip flexion 4 3+  Hip extension    Hip abduction 4 3+  Hip adduction 4 4-  Hip internal rotation    Hip external rotation    Knee flexion 4 3  Knee extension 4 4  Ankle dorsiflexion    Ankle plantarflexion    Ankle inversion    Ankle eversion    (Blank rows = not tested)  TRANSFERS: Assistive device utilized: None  Sit to stand: Modified independence Stand to sit: Modified independence Chair to chair: Modified independence Floor: Max A  CURB:  Level of Assistance: Modified independence Assistive device utilized: None  STAIRS: Level of Assistance: Modified independence Stair Negotiation Technique: Step to Pattern Alternating Pattern  with Single Rail on Right  GAIT: Gait pattern: step to pattern, decreased arm swing- Left, decreased step length- Left, decreased stance time- Left, decreased stride length, ataxic, trunk rotated posterior- Left, narrow BOS, and poor foot clearance- Left Distance walked: clinic distances Assistive device utilized: None Level of assistance: Modified independence  FUNCTIONAL TESTS:  5 times sit to stand: 14.63s Timed up and go (TUG): 13.06s  TREATMENT DATE:  07/16/23 Nustep level 5 x 6 minutes Step turn reach a lot of cues needed On airex reaching for numbers Standing on Bosu upside down balance CGA On rocker board balance CGA LEg press 20# 2x10 ball b/n knees, then left only iwht manual assist for knee alignement Standing volley ball ball kicks Leg curls 15# 2x10 Leg extension 5# 2x10 cues for TKE On airex right arm row and extension Volley ball  Ball kicks Partial sit ups Gait outside int he grass  07/14/23- EVAL    PATIENT EDUCATION: Education details: POC, reviewed HEP with her and Sheppard Penton her caretaker today Person educated: Patient and Caregiver Sheppard Penton Education method: Explanation Education comprehension: verbalized understanding  HOME EXERCISE PROGRAM: [id number]   GOALS: Goals reviewed with patient? Yes  SHORT TERM GOALS: Target date: 08/25/23  Patient will be independent with initial HEP. Baseline: has an old HEP but not doing Goal status: INITIAL  2.  Patient will demonstrate decreased fall risk by scoring < 12 sec on 5xSTS. Baseline: 14.63s Goal status: INITIAL   LONG TERM GOALS: Target date: 10/06/23  Patient will be independent with advanced/ongoing HEP to improve outcomes and carryover.  Baseline:  Goal status: INITIAL  2.  Patient will be able to complete floor transfer with minA   Baseline: mod-maxA needed when she falls Goal status: INITIAL  3.  Patient will report no falls in a 4 week period   Baseline: "fell last night" 07/14/23 Goal status: INITIAL   4.  Patient will demonstrate improved functional LE strength as demonstrated by 4/5 in LLE. Baseline: see chart Goal status: INITIAL  5.  Patient will ambulate with more normalized gait pattern and less internal rotation of LLE  Baseline: caretakers reports her LLE in turning in more than usual  Goal status: INITIAL   ASSESSMENT:  CLINICAL  IMPRESSION: Initiated gym and balance activities, she is hesitant to let go with hands due to balance issues, with the weights we tried to have her do good form and go through full ROM, needing cues for this, with walking in the grass she was fearful  Patient is a 23 y.o. female who was seen today for physical therapy evaluation and treatment for CP. She is a returning patient who has no real changes but has had an increase in numbers of falls and has noticed that her LLE turns inwards more than it used to. Her LLE is weak but it is hard to tell how accurate it is due to her tone. Patient will benefit from ongoing strength training and balance to decrease her risk for falls. She will also benefit from stretching to increase her flexibility, especially in her hips.   OBJECTIVE IMPAIRMENTS: Abnormal gait, decreased balance, decreased coordination, decreased endurance, decreased ROM, decreased strength, impaired tone, impaired UE functional use, and postural dysfunction.   ACTIVITY LIMITATIONS: squatting, stairs, transfers, and locomotion level  PERSONAL FACTORS: Age, Behavior pattern, Past/current experiences, and Time since onset of injury/illness/exacerbation are also affecting patient's functional outcome.   REHAB POTENTIAL: Fair chronicity of symptoms  CLINICAL DECISION MAKING: Stable/uncomplicated  EVALUATION COMPLEXITY: Low  PLAN:  PT FREQUENCY: 2x/week  PT DURATION: 12 weeks  PLANNED INTERVENTIONS: 97110-Therapeutic exercises, 97530- Therapeutic activity, [id number]- Neuromuscular re-education, 97535- Self Care, [id number]- Manual therapy, [phone]- Gait training, Patient/Family education, Balance training, Stair training, Taping, Dry Needling, Joint mobilization, Spinal mobilization, Cryotherapy, and Moist heat  PLAN FOR NEXT SESSION: LE stretching, strengthening hip abd, balance, functional strengthening   Jearld Lesch, PT  07/16/2023, 1:59 PM

## 2023-07-27 NOTE — Therapy (Signed)
 OUTPATIENT PHYSICAL THERAPY NEURO TREATMENT   Patient Name: Kerri Robertson MRN: [id number] DOB:08/20/2000, 23 y.o., female Today's Date: 07/28/2023   PCP: Hyman Hopes REFERRING PROVIDER: Hyman Hopes  END OF SESSION:  PT End of Session - 07/28/23 1232     Visit Number 3    Date for PT Re-Evaluation 10/06/23    Authorization Type [employer]    PT Start Time 1230    PT Stop Time 1315    PT Time Calculation (min) 45 min    Activity Tolerance Patient tolerated treatment well    Behavior During Therapy WFL for tasks assessed/performed              Past Medical History:  Diagnosis Date   Allergy    Cerebral palsy ([name])    CP (cerebral palsy) ([name])    Stroke [facility])    Past Surgical History:  Procedure Laterality Date   ARM SURGERY     2013   FOOT SURGERY     2010   HAMSTRING LENGTHENING     TIBIA / FIBIA LENGTHENING     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   Balance problem 05/06/2018   Syncope 05/06/2018   History of cerebrovascular accident 11/18/2017   Anxiety state 06/25/2017   Spasticity 06/25/2017   Allergic rhinitis 10/21/2016   Mild intermittent asthma 10/21/2016    ONSET DATE: 07/09/23- new referral  REFERRING DIAG:  G80.0 (ICD-10-CM) - CP (cerebral palsy), spastic, 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[name])      THERAPY DIAG:  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 I threw up and feel better. Might have an ear infection   In January I had a lot of fall, it was like 3. February was fine.  This month, I had a fall last night. I am not sure how I feel but my legs ended up under a table. Only got hurt on my elbow.   Pt accompanied by:  caretaker   PERTINENT HISTORY: CP 07/09/23-- Kerri Robertson is a 23 year old female who presents for a new physical therapy referral and recent fever with allergy symptoms. She is seeking a new referral for physical therapy, which is required periodically for insurance purposes. She has been attending the same physical therapy location and typically sees a therapist named Casimiro Needle, although she has seen different therapists at times. Her physical therapy primarily focuses on strength and balance exercises related to her cerebral palsy.    PAIN:  Are you having pain? No  PRECAUTIONS: Fall  RED FLAGS: None   WEIGHT BEARING RESTRICTIONS: No  FALLS: Has patient fallen in last 6 months? Yes. Number of falls 4-5  LIVING ENVIRONMENT: Lives with: lives with their family Lives in: House/apartment Stairs: No  PLOF: Independent with basic ADLs  PATIENT GOALS: to be able to get up from the floor, have less falls, work on balance and walking better  OBJECTIVE:  Note: Objective measures were completed at Evaluation unless otherwise noted.  DIAGNOSTIC FINDINGS: IMPRESSION: 1. The acetabulum is relatively shallow and the left femoral head demonstrates an unusual contour. These findings may be developmental secondary to the patient's cerebral palsy. 2. No  other abnormalities.  COGNITION: Overall cognitive status: Within functional limits for tasks assessed   SENSATION: WFL  COORDINATION: Decrease coordination d/t CP  MUSCLE TONE: LLE: Mild and Moderate  MUSCLE LENGTH: Hamstrings and hip flexors are tight on both sides.   POSTURE: rounded shoulders, left pelvic obliquity, and weight shift right  LOWER EXTREMITY ROM:  Trunk lateraly flexed due to scoliosis, L hip unable to achieve neutral ER, abd limited, B hamstrings tight, L ankle does not achieve neutral DF or inversion. Her L foot in turned inwards more, increased IR of hip. LUE with limited ROM at all joints, in all planes. She wears a splint on her wrist at night some nights.   LOWER EXTREMITY MMT:  MMT accuracy is questionable due to tone   MMT Right Eval Left Eval  Hip flexion 4 3+  Hip extension    Hip abduction 4 3+  Hip adduction 4 4-  Hip internal rotation    Hip external rotation    Knee flexion 4 3  Knee extension 4 4  Ankle dorsiflexion    Ankle plantarflexion    Ankle inversion    Ankle eversion    (Blank rows = not tested)  TRANSFERS: Assistive device utilized: None  Sit to stand: Modified independence Stand to sit: Modified independence Chair to chair: Modified independence Floor: Max A  CURB:  Level of Assistance: Modified independence Assistive device utilized: None  STAIRS: Level of Assistance: Modified independence Stair Negotiation Technique: Step to Pattern Alternating Pattern  with Single Rail on Right  GAIT: Gait pattern: step to pattern, decreased arm swing- Left, decreased step length- Left, decreased stance time- Left, decreased stride length, ataxic, trunk rotated posterior- Left, narrow BOS, and poor foot clearance- Left Distance walked: clinic distances Assistive device utilized: None Level of assistance: Modified independence  FUNCTIONAL TESTS:  5 times sit to stand: 14.63s Timed up and go (TUG): 13.06s  TREATMENT DATE:  07/28/23 NuStep L5x65mins Bike L3 x72mins  Leg ext 5# 2x10 HS curls 15# 2x10 Fitter pushes 2 blue bands 2x10  Ball squeeze 2x10 Side steps on airex in pbars Standing on airex reaching for different color spike balls  Standing on airex reaching for numbers HS stretching 30s x2   07/16/23 Nustep level 5 x 6 minutes Step turn reach a lot of cues needed On airex reaching for numbers Standing on Bosu upside down balance CGA On rocker board balance CGA LEg press 20# 2x10 ball b/n knees, then left only iwht manual assist for knee alignement Standing volley ball ball kicks Leg curls 15# 2x10 Leg extension 5# 2x10 cues for TKE On airex right arm row and extension Volley ball  Ball kicks Partial sit ups Gait outside int he grass  07/14/23- EVAL    PATIENT EDUCATION: Education details: POC, reviewed HEP with her and Sheppard Penton her caretaker today Person educated: Patient and Caregiver Sheppard Penton Education method: Explanation Education comprehension: verbalized understanding  HOME EXERCISE PROGRAM: [email or web address]   GOALS: Goals reviewed with patient? Yes  SHORT TERM GOALS: Target date: 08/25/23  Patient will be independent with initial HEP. Baseline: has an old HEP but not doing Goal status: INITIAL  2.  Patient will demonstrate decreased fall risk by scoring < 12 sec on 5xSTS. Baseline: 14.63s Goal status: INITIAL   LONG TERM GOALS: Target date: 10/06/23  Patient will be independent with advanced/ongoing HEP to improve outcomes and carryover.  Baseline:  Goal status: INITIAL  2.  Patient will be able to complete floor transfer with minA   Baseline: mod-maxA needed when she falls Goal status: INITIAL  3.  Patient will report no falls in a 4 week period   Baseline: "fell last night" 07/14/23 Goal status: INITIAL   4.  Patient will demonstrate improved functional LE strength as demonstrated by 4/5 in  LLE. Baseline: see chart Goal status: INITIAL  5.  Patient will ambulate with more normalized gait pattern and less internal rotation of LLE  Baseline: caretakers reports her LLE in turning in more than usual  Goal status: INITIAL   ASSESSMENT:  CLINICAL IMPRESSION: Patient reports some dizziness and vomiting this morning. Attributes it to a possible ear infection. With leg extensions on machines cues to get TKE but she is unable to get full extension. Does well with mulitasking on airex, no LOB.   Patient is a 23 y.o. female who was seen today for physical therapy evaluation and treatment for CP. She is a returning patient who has no real changes but has had an increase in numbers of falls and has noticed that her LLE turns inwards more than it used to. Her LLE is weak but it is hard to tell how accurate it is due to her tone. Patient will benefit from ongoing strength training and balance to decrease her risk for falls. She will also benefit from stretching to increase her flexibility, especially in her hips.   OBJECTIVE IMPAIRMENTS: Abnormal gait, decreased balance, decreased coordination, decreased endurance, decreased ROM, decreased strength, impaired tone, impaired UE functional use, and postural dysfunction.   ACTIVITY LIMITATIONS: squatting, stairs, transfers, and locomotion level  PERSONAL FACTORS: Age, Behavior pattern, Past/current experiences, and Time since onset of injury/illness/exacerbation are also affecting patient's functional outcome.   REHAB POTENTIAL: Fair chronicity of symptoms  CLINICAL DECISION MAKING: Stable/uncomplicated  EVALUATION COMPLEXITY: Low  PLAN:  PT FREQUENCY: 2x/week  PT DURATION: 12 weeks  PLANNED INTERVENTIONS: 97110-Therapeutic exercises, 97530-  Therapeutic activity, [id number]- Neuromuscular re-education, [phone]- Self Care, [id number]- Manual therapy, [phone]- Gait training, Patient/Family education, Balance training, Stair training, Taping, Dry  Needling, Joint mobilization, Spinal mobilization, Cryotherapy, and Moist heat  PLAN FOR NEXT SESSION: LE stretching, strengthening hip abd, balance, functional strengthening   Cassie Freer, PT 07/28/2023, 1:11 PM

## 2023-07-28 ENCOUNTER — Ambulatory Visit

## 2023-07-28 DIAGNOSIS — M6281 Muscle weakness (generalized): Secondary | ICD-10-CM | POA: Diagnosis not present

## 2023-07-28 DIAGNOSIS — I69954 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: Secondary | ICD-10-CM

## 2023-07-28 DIAGNOSIS — R2689 Other abnormalities of gait and mobility: Secondary | ICD-10-CM

## 2023-07-28 DIAGNOSIS — R293 Abnormal posture: Secondary | ICD-10-CM

## 2023-07-28 DIAGNOSIS — R2681 Unsteadiness on feet: Secondary | ICD-10-CM

## 2023-07-28 DIAGNOSIS — R262 Difficulty in walking, not elsewhere classified: Secondary | ICD-10-CM

## 2023-07-28 DIAGNOSIS — G800 Spastic quadriplegic cerebral palsy: Secondary | ICD-10-CM

## 2023-07-28 DIAGNOSIS — R278 Other lack of coordination: Secondary | ICD-10-CM

## 2023-08-03 NOTE — Therapy (Signed)
 OUTPATIENT PHYSICAL THERAPY NEURO TREATMENT   Patient Name: Kerri Robertson MRN: [id number] DOB:[birth year], 23 y.o., female Today's Date: 08/04/2023   PCP: Hyman Hopes REFERRING PROVIDER: Hyman Hopes  END OF SESSION:  PT End of Session - 08/04/23 1228     Visit Number 4    Date for PT Re-Evaluation 10/06/23    Authorization Type [employer]et[employer]    PT Start Time 1230    PT Stop Time 1315    PT Time Calculation (min) 45 min    Activity Tolerance Patient tolerated treatment well    Behavior During Therapy WFL for tasks assessed/performed               Past Medical History:  Diagnosis Date   Allergy    Cerebral palsy ([name])    CP (cerebral palsy) ([name])    Stroke [facility])    Past Surgical History:  Procedure Laterality Date   ARM SURGERY     2013   FOOT SURGERY     2010   HAMSTRING LENGTHENING     TIBIA / FIBIA LENGTHENING     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   Balance problem 05/06/2018   Syncope 05/06/2018   History of cerebrovascular accident 11/18/2017   Anxiety state 06/25/2017   Spasticity 06/25/2017   Allergic rhinitis 10/21/2016   Mild intermittent asthma 10/21/2016    ONSET DATE: 07/09/23- new referral  REFERRING DIAG:  G80.0 (ICD-10-CM) - CP (cerebral palsy), spastic, 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[name])      THERAPY DIAG:  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 I threw up and feel better. Might have an ear infection   In January I had a lot of fall, it was like 3. February was  fine. This month, I had a fall last night. I am not sure how I feel but my legs ended up under a table. Only got hurt on my elbow.   Pt accompanied by:  caretaker   PERTINENT HISTORY: CP 07/09/23-- Kerri Robertson is a 23 year old female who presents for a new physical therapy referral and recent fever with allergy symptoms. She is seeking a new referral for physical therapy, which is required periodically for insurance purposes. She has been attending the same physical therapy location and typically sees a therapist named Casimiro Needle, although she has seen different therapists at times. Her physical therapy primarily focuses on strength and balance exercises related to her cerebral palsy.    PAIN:  Are you having pain? No  PRECAUTIONS: Fall  RED FLAGS: None   WEIGHT BEARING RESTRICTIONS: No  FALLS: Has patient fallen in last 6 months? Yes. Number of falls 4-5  LIVING ENVIRONMENT: Lives with: lives with their family Lives in: House/apartment Stairs: No  PLOF: Independent with basic ADLs  PATIENT GOALS: to be able to get up from the floor, have less falls, work on balance and walking better  OBJECTIVE:  Note: Objective measures were completed at Evaluation unless otherwise noted.  DIAGNOSTIC FINDINGS: IMPRESSION: 1. The acetabulum is relatively shallow and the left femoral head demonstrates an unusual contour. These findings may be developmental secondary to the patient's cerebral palsy. 2. No  other abnormalities.  COGNITION: Overall cognitive status: Within functional limits for tasks assessed   SENSATION: WFL  COORDINATION: Decrease coordination d/t CP  MUSCLE TONE: LLE: Mild and Moderate  MUSCLE LENGTH: Hamstrings and hip flexors are tight on both sides.   POSTURE: rounded shoulders, left pelvic obliquity, and weight shift right  LOWER EXTREMITY ROM:  Trunk lateraly flexed due to scoliosis, L hip unable to achieve neutral ER, abd limited, B hamstrings tight, L ankle does not achieve neutral DF or inversion. Her L foot in turned inwards more, increased IR of hip. LUE with limited ROM at all joints, in all planes. She wears a splint on her wrist at night some nights.   LOWER EXTREMITY MMT:  MMT accuracy is questionable due to tone   MMT Right Eval Left Eval  Hip flexion 4 3+  Hip extension    Hip abduction 4 3+  Hip adduction 4 4-  Hip internal rotation    Hip external rotation    Knee flexion 4 3  Knee extension 4 4  Ankle dorsiflexion    Ankle plantarflexion    Ankle inversion    Ankle eversion    (Blank rows = not tested)  TRANSFERS: Assistive device utilized: None  Sit to stand: Modified independence Stand to sit: Modified independence Chair to chair: Modified independence Floor: Max A  CURB:  Level of Assistance: Modified independence Assistive device utilized: None  STAIRS: Level of Assistance: Modified independence Stair Negotiation Technique: Step to Pattern Alternating Pattern  with Single Rail on Right  GAIT: Gait pattern: step to pattern, decreased arm swing- Left, decreased step length- Left, decreased stance time- Left, decreased stride length, ataxic, trunk rotated posterior- Left, narrow BOS, and poor foot clearance- Left Distance walked: clinic distances Assistive device utilized: None Level of assistance: Modified independence  FUNCTIONAL TESTS:  5 times sit to stand: 14.63s Timed up and go (TUG): 13.06s  TREATMENT DATE:  08/04/23 Walk outdoors around [employer]  Leg press 20# 2x10 with ball in between legs to prevent valgus stress  Rocker board balance On BOSU balance and then reaching for targets NuStep L5x64mins LE only  Partial sit up 2x10 HS stretch 30s x2  Bridges with band 2x10 Volleyball hits    07/28/23 NuStep L5x89mins Bike L3 x25mins  Leg ext 5# 2x10 HS curls 15# 2x10 Fitter pushes 2 blue bands 2x10  Ball squeeze 2x10 Side steps on airex in pbars Standing on airex reaching for different color spike balls  Standing on airex reaching for numbers HS stretching 30s x2   07/16/23 Nustep level 5 x 6 minutes Step turn reach a lot of cues needed On airex reaching for numbers Standing on Bosu upside down balance CGA On rocker board balance CGA LEg press 20# 2x10 ball b/n knees, then left only iwht manual assist for knee alignement Standing volley ball ball kicks Leg curls 15# 2x10 Leg extension 5# 2x10 cues for TKE On airex right arm row and extension Volley ball  Ball kicks Partial sit ups Gait outside int he grass  07/14/23- EVAL    PATIENT EDUCATION: Education details: POC, reviewed HEP with her and Sheppard Penton her caretaker today Person educated: Patient and Caregiver Sheppard Penton Education method: Explanation Education comprehension: verbalized understanding  HOME EXERCISE PROGRAM: Access Code: [email or web address] URL: [email or web address] Date: 08/04/2023 Prepared by: Cassie Freer  Exercises - Supine Hip Internal and External Rotation  - 1 x daily - 7 x weekly - 3 sets - 10 reps - Supine Hip Abduction on Slider  - 1 x daily - 7 x weekly - 3 sets - 10 reps - Hooklying Clamshell with Resistance  - 1 x daily - 7 x weekly - 3 sets - 10 reps - Hip Abduction and Adduction Caregiver PROM  - 1 x daily - 7 x weekly - 3 sets - 10 reps - Hip Internal and External Rotation Caregiver PROM  - 1 x daily - 7 x weekly  - 3 sets - 10 reps - Supine Short Arc Quad  - 1 x daily - 7 x weekly - 3 sets - 10 reps - Supine Bridge  - 1 x daily - 7 x weekly - 3 sets - 10 reps   GOALS: Goals reviewed with patient? Yes  SHORT TERM GOALS: Target date: 08/25/23  Patient will be independent with initial HEP. Baseline: has an old HEP but not doing Goal status: INITIAL  2.  Patient will demonstrate decreased fall risk by scoring < 12 sec on 5xSTS. Baseline: 14.63s Goal status: INITIAL   LONG TERM GOALS: Target date: 10/06/23  Patient will be independent with advanced/ongoing HEP to improve outcomes and carryover.  Baseline:  Goal status: INITIAL  2.  Patient will be able to complete floor transfer with minA   Baseline: mod-maxA needed when she falls Goal status: INITIAL  3.  Patient will report no falls in a 4 week period   Baseline: "fell last night" 07/14/23 Goal status: INITIAL   4.  Patient will demonstrate improved functional LE strength as demonstrated by 4/5 in LLE. Baseline: see chart Goal status: INITIAL  5.  Patient will ambulate with more normalized gait pattern and less internal rotation of LLE  Baseline: caretakers reports her LLE in turning in more than usual  Goal status: INITIAL   ASSESSMENT:  CLINICAL IMPRESSION: Patient reports some dizziness and vomiting this morning. Attributes it to a possible ear infection.  With leg extensions on machines cues to get TKE but she is unable to get full extension. Does well with mulitasking on airex, no LOB.   Patient is a 23 y.o. female who was seen today for physical therapy evaluation and treatment for CP. She is a returning patient who has no real changes but has had an increase in numbers of falls and has noticed that her LLE turns inwards more than it used to. Her LLE is weak but it is hard to tell how accurate it is due to her tone. Patient will benefit from ongoing strength training and balance to decrease her risk for falls. She will also benefit  from stretching to increase her flexibility, especially in her hips.   OBJECTIVE IMPAIRMENTS: Abnormal gait, decreased balance, decreased coordination, decreased endurance, decreased ROM, decreased strength, impaired tone, impaired UE functional use, and postural dysfunction.   ACTIVITY LIMITATIONS: squatting, stairs, transfers, and locomotion level  PERSONAL FACTORS: Age, Behavior pattern, Past/current experiences, and Time since onset of injury/illness/exacerbation are also affecting patient's functional outcome.   REHAB POTENTIAL: Fair chronicity of symptoms  CLINICAL DECISION MAKING: Stable/uncomplicated  EVALUATION COMPLEXITY: Low  PLAN:  PT FREQUENCY: 2x/week  PT DURATION: 12 weeks  PLANNED INTERVENTIONS: 97110-Therapeutic exercises, 97530- Therapeutic activity, 97112- Neuromuscular re-education, 97535- Self Care, 45409- Manual therapy, [phone]- Gait training, Patient/Family education, Balance training, Stair training, Taping, Dry Needling, Joint mobilization, Spinal mobilization, Cryotherapy, and Moist heat  PLAN FOR NEXT SESSION: LE stretching, strengthening hip abd, balance, functional strengthening   Cassie Freer, PT 08/04/2023, 1:10 PM

## 2023-08-04 ENCOUNTER — Ambulatory Visit: Attending: Family Medicine

## 2023-08-04 DIAGNOSIS — R2689 Other abnormalities of gait and mobility: Secondary | ICD-10-CM | POA: Insufficient documentation

## 2023-08-04 DIAGNOSIS — M6281 Muscle weakness (generalized): Secondary | ICD-10-CM | POA: Diagnosis present

## 2023-08-04 DIAGNOSIS — R262 Difficulty in walking, not elsewhere classified: Secondary | ICD-10-CM | POA: Diagnosis present

## 2023-08-04 DIAGNOSIS — R2681 Unsteadiness on feet: Secondary | ICD-10-CM | POA: Diagnosis present

## 2023-08-04 DIAGNOSIS — R293 Abnormal posture: Secondary | ICD-10-CM | POA: Diagnosis present

## 2023-08-04 DIAGNOSIS — R278 Other lack of coordination: Secondary | ICD-10-CM | POA: Insufficient documentation

## 2023-08-04 DIAGNOSIS — G800 Spastic quadriplegic cerebral palsy: Secondary | ICD-10-CM | POA: Insufficient documentation

## 2023-08-04 DIAGNOSIS — R531 Weakness: Secondary | ICD-10-CM | POA: Diagnosis present

## 2023-08-04 DIAGNOSIS — G801 Spastic diplegic cerebral palsy: Secondary | ICD-10-CM | POA: Insufficient documentation

## 2023-08-04 DIAGNOSIS — I69954 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: Secondary | ICD-10-CM | POA: Insufficient documentation

## 2023-08-06 ENCOUNTER — Encounter: Payer: Self-pay | Admitting: Physical Therapy

## 2023-08-06 ENCOUNTER — Ambulatory Visit: Admitting: Physical Therapy

## 2023-08-06 DIAGNOSIS — I69954 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: Secondary | ICD-10-CM

## 2023-08-06 DIAGNOSIS — R2681 Unsteadiness on feet: Secondary | ICD-10-CM

## 2023-08-06 DIAGNOSIS — R293 Abnormal posture: Secondary | ICD-10-CM

## 2023-08-06 DIAGNOSIS — G800 Spastic quadriplegic cerebral palsy: Secondary | ICD-10-CM

## 2023-08-06 DIAGNOSIS — R2689 Other abnormalities of gait and mobility: Secondary | ICD-10-CM

## 2023-08-06 DIAGNOSIS — R278 Other lack of coordination: Secondary | ICD-10-CM

## 2023-08-06 NOTE — Therapy (Signed)
 OUTPATIENT PHYSICAL THERAPY NEURO TREATMENT   Patient Name: Kerri Robertson MRN: [id number] DOB:2000-06-17, 23 y.o., female Today's Date: 08/06/2023   PCP: Kerri Robertson REFERRING PROVIDER: Hyman Robertson  END OF SESSION:  PT End of Session - 08/06/23 1301     Visit Number 5    Date for PT Re-Evaluation 10/06/23    Authorization Type [employer]    PT Start Time 1302    PT Stop Time 1343    PT Time Calculation (min) 41 min    Activity Tolerance Patient tolerated treatment well    Behavior During Therapy WFL for tasks assessed/performed                Past Medical History:  Diagnosis Date   Allergy    Cerebral palsy ([name])    CP (cerebral palsy) ([name])    Stroke [facility])    Past Surgical History:  Procedure Laterality Date   ARM SURGERY     2013   FOOT SURGERY     2010   HAMSTRING LENGTHENING     TIBIA / FIBIA LENGTHENING     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   Balance problem 05/06/2018   Syncope 05/06/2018   History of cerebrovascular accident 11/18/2017   Anxiety state 06/25/2017   Spasticity 06/25/2017   Allergic rhinitis 10/21/2016   Mild intermittent asthma 10/21/2016    ONSET DATE: 07/09/23- new referral  REFERRING DIAG:  G80.0 (ICD-10-CM) - CP (cerebral palsy), spastic, 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[name])      THERAPY DIAG:  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)  Spastic quadriplegic cerebral palsy ([name])  Other lack of coordination  Abnormal posture  Unsteadiness on feet  Other abnormalities of gait and mobility  Rationale for  Evaluation and Treatment: Rehabilitation  SUBJECTIVE:                                                                                                                                                                                             SUBJECTIVE STATEMENT:  Feeling better today, balance and strength are the focus right now     Pt accompanied by:  caretaker   PERTINENT HISTORY: CP 07/09/23-- Kerri Robertson is a 23 year old female who presents for a new physical therapy referral  and recent fever with allergy symptoms. She is seeking a new referral for physical therapy, which is required periodically for insurance purposes. She has been attending the same physical therapy location and typically sees a therapist named Kerri Needle, although she has seen different therapists at times. Her physical therapy primarily focuses on strength and balance exercises related to her cerebral palsy.    PAIN:  Are you having pain? No 0/10  PRECAUTIONS: Fall  RED FLAGS: None   WEIGHT BEARING RESTRICTIONS: No  FALLS: Has patient fallen in last 6 months? Yes. Number of falls 4-5  LIVING ENVIRONMENT: Lives with: lives with their family Lives in: House/apartment Stairs: No  PLOF: Independent with basic ADLs  PATIENT GOALS: to be able to get up from the floor, have less falls, work on balance and walking better  OBJECTIVE:  Note: Objective measures were completed at Evaluation unless otherwise noted.  DIAGNOSTIC FINDINGS: IMPRESSION: 1. The acetabulum is relatively shallow and the left femoral head demonstrates an unusual contour. These findings may be developmental secondary to the patient's cerebral palsy. 2. No other abnormalities.  COGNITION: Overall cognitive status: Within functional limits for tasks assessed   SENSATION: WFL  COORDINATION: Decrease coordination d/t CP  MUSCLE TONE: LLE: Mild and Moderate  MUSCLE LENGTH: Hamstrings and hip flexors are tight on both sides.    POSTURE: rounded shoulders, left pelvic obliquity, and weight shift right  LOWER EXTREMITY ROM:  Trunk lateraly flexed due to scoliosis, L hip unable to achieve neutral ER, abd limited, B hamstrings tight, L ankle does not achieve neutral DF or inversion. Her L foot in turned inwards more, increased IR of hip. LUE with limited ROM at all joints, in all planes. She wears a splint on her wrist at night some nights.   LOWER EXTREMITY MMT:  MMT accuracy is questionable due to tone   MMT Right Eval Left Eval  Hip flexion 4 3+  Hip extension    Hip abduction 4 3+  Hip adduction 4 4-  Hip internal rotation    Hip external rotation    Knee flexion 4 3  Knee extension 4 4  Ankle dorsiflexion    Ankle plantarflexion    Ankle inversion    Ankle eversion    (Blank rows = not tested)  TRANSFERS: Assistive device utilized: None  Sit to stand: Modified independence Stand to sit: Modified independence Chair to chair: Modified independence Floor: Max A  CURB:  Level of Assistance: Modified independence Assistive device utilized: None  STAIRS: Level of Assistance: Modified independence Stair Negotiation Technique: Step to Pattern Alternating Pattern  with Single Rail on Right  GAIT: Gait pattern: step to pattern, decreased arm swing- Left, decreased step length- Left, decreased stance time- Left, decreased stride length, ataxic, trunk rotated posterior- Left, narrow BOS, and poor foot clearance- Left Distance walked: clinic distances Assistive device utilized: None Level of assistance: Modified independence  FUNCTIONAL TESTS:  5 times sit to stand: 14.63s Timed up and go (TUG): 13.06s                                                                                TREATMENT DATE:   08/06/23   Alternating  toe taps BOSU + stepping over half foam rolls + forward and backwards figure 8s x2 rounds  Lateral lunges onto BOSU + sideways/diagonal steps over foam rollers + weaving  between cones + backwards steps onto 4 inch box with U UE support (2 rounds) Forward step ups x10 + forward/side steps over full and half foam rolls + toe taps on air targets  Nustep L6x8 minutes BLEs only      08/04/23 Walk outdoors around [employer]  Leg press 20# 2x10 with ball in between legs to prevent valgus stress  Rocker board balance On BOSU balance and then reaching for targets NuStep L5x45mins LE only  Partial sit up 2x10 HS stretch 30s x2  Bridges with band 2x10 Volleyball hits    07/28/23 NuStep L5x58mins Bike L3 x36mins  Leg ext 5# 2x10 HS curls 15# 2x10 Fitter pushes 2 blue bands 2x10  Ball squeeze 2x10 Side steps on airex in pbars Standing on airex reaching for different color spike balls  Standing on airex reaching for numbers HS stretching 30s x2   07/16/23 Nustep level 5 x 6 minutes Step turn reach a lot of cues needed On airex reaching for numbers Standing on Bosu upside down balance CGA On rocker board balance CGA LEg press 20# 2x10 ball b/n knees, then left only iwht manual assist for knee alignement Standing volley ball ball kicks Leg curls 15# 2x10 Leg extension 5# 2x10 cues for TKE On airex right arm row and extension Volley ball  Ball kicks Partial sit ups Gait outside int he grass  07/14/23- EVAL    PATIENT EDUCATION: Education details: POC, reviewed HEP with her and Kerri Robertson her caretaker today Person educated: Patient and Caregiver Kerri Robertson Education method: Explanation Education comprehension: verbalized understanding  HOME EXERCISE PROGRAM: Access Code: [email or web address] URL: [email or web address] Date: 08/04/2023 Prepared by: Cassie Freer  Exercises - Supine Hip Internal and External Rotation  - 1 x daily - 7 x weekly - 3 sets - 10 reps - Supine Hip Abduction on Slider  - 1 x daily - 7 x weekly - 3 sets - 10 reps - Hooklying Clamshell with Resistance  - 1 x daily - 7 x weekly - 3 sets - 10 reps - Hip Abduction and  Adduction Caregiver PROM  - 1 x daily - 7 x weekly - 3 sets - 10 reps - Hip Internal and External Rotation Caregiver PROM  - 1 x daily - 7 x weekly - 3 sets - 10 reps - Supine Short Arc Quad  - 1 x daily - 7 x weekly - 3 sets - 10 reps - Supine Bridge  - 1 x daily - 7 x weekly - 3 sets - 10 reps   GOALS: Goals reviewed with patient? Yes  SHORT TERM GOALS: Target date: 08/25/23  Patient will be independent with initial HEP. Baseline: has an old HEP but not doing Goal status: INITIAL  2.  Patient will demonstrate decreased fall risk by scoring < 12 sec on 5xSTS. Baseline: 14.63s Goal status: INITIAL   LONG TERM GOALS: Target date: 10/06/23  Patient will be independent with advanced/ongoing HEP to improve outcomes and carryover.  Baseline:  Goal status: INITIAL  2.  Patient will be able to complete floor transfer with minA   Baseline: mod-maxA needed when she falls Goal status: INITIAL  3.  Patient will report no falls in a 4 week period   Baseline: "fell last night" 07/14/23 Goal status: INITIAL   4.  Patient will demonstrate improved functional LE strength as demonstrated by 4/5 in LLE. Baseline: see chart Goal status: INITIAL  5.  Patient will ambulate with more normalized gait pattern and less internal rotation of LLE  Baseline: caretakers reports her LLE in turning in more than usual  Goal status: INITIAL   ASSESSMENT:  CLINICAL IMPRESSION:    Focused on advanced functional balance and coordination training, also worked in some activities related to curbs as this is difficult for her. Did well, very motivated to improve but does have ongoing impairments related to her CP. Has difficulty with foot clearance due to some tone but as expected given her impairments from CP.     Patient is a 23 y.o. female who was seen today for physical therapy evaluation and treatment for CP. She is a returning patient who has no real changes but has had an increase in numbers of falls  and has noticed that her LLE turns inwards more than it used to. Her LLE is weak but it is hard to tell how accurate it is due to her tone. Patient will benefit from ongoing strength training and balance to decrease her risk for falls. She will also benefit from stretching to increase her flexibility, especially in her hips.   OBJECTIVE IMPAIRMENTS: Abnormal gait, decreased balance, decreased coordination, decreased endurance, decreased ROM, decreased strength, impaired tone, impaired UE functional use, and postural dysfunction.   ACTIVITY LIMITATIONS: squatting, stairs, transfers, and locomotion level  PERSONAL FACTORS: Age, Behavior pattern, Past/current experiences, and Time since onset of injury/illness/exacerbation are also affecting patient's functional outcome.   REHAB POTENTIAL: Fair chronicity of symptoms  CLINICAL DECISION MAKING: Stable/uncomplicated  EVALUATION COMPLEXITY: Low  PLAN:  PT FREQUENCY: 2x/week  PT DURATION: 12 weeks  PLANNED INTERVENTIONS: 97110-Therapeutic exercises, 97530- Therapeutic activity, 97112- Neuromuscular re-education, 97535- Self Care, 16109- Manual therapy, [phone]- Gait training, Patient/Family education, Balance training, Stair training, Taping, Dry Needling, Joint mobilization, Spinal mobilization, Cryotherapy, and Moist heat  PLAN FOR NEXT SESSION: LE stretching, strengthening hip abd, balance, functional strengthening, work on curbs   [employer], PT, DPT 08/06/23 1:43 PM

## 2023-08-11 ENCOUNTER — Encounter: Payer: Self-pay | Admitting: Physical Therapy

## 2023-08-11 ENCOUNTER — Ambulatory Visit: Admitting: Physical Therapy

## 2023-08-11 DIAGNOSIS — M6281 Muscle weakness (generalized): Secondary | ICD-10-CM

## 2023-08-11 DIAGNOSIS — G801 Spastic diplegic cerebral palsy: Secondary | ICD-10-CM

## 2023-08-11 DIAGNOSIS — R293 Abnormal posture: Secondary | ICD-10-CM

## 2023-08-11 DIAGNOSIS — R2681 Unsteadiness on feet: Secondary | ICD-10-CM

## 2023-08-11 DIAGNOSIS — I69954 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: Secondary | ICD-10-CM

## 2023-08-11 DIAGNOSIS — R278 Other lack of coordination: Secondary | ICD-10-CM

## 2023-08-11 DIAGNOSIS — G800 Spastic quadriplegic cerebral palsy: Secondary | ICD-10-CM

## 2023-08-11 DIAGNOSIS — R531 Weakness: Secondary | ICD-10-CM

## 2023-08-11 NOTE — Therapy (Signed)
 OUTPATIENT PHYSICAL THERAPY NEURO TREATMENT   Patient Name: Kerri Robertson MRN: [id number] DOB:August 15, 2000, 23 y.o., female Today's Date: 08/11/2023   PCP: Kerri Robertson REFERRING PROVIDER: Hyman Robertson  END OF SESSION:  PT End of Session - 08/11/23 1324     Visit Number 6    Date for PT Re-Evaluation 10/06/23    Authorization Type [employer]    PT Start Time 1310    PT Stop Time 1400    PT Time Calculation (min) 50 min    Activity Tolerance Patient tolerated treatment well    Behavior During Therapy WFL for tasks assessed/performed                Past Medical History:  Diagnosis Date   Allergy    Cerebral palsy ([name])    CP (cerebral palsy) ([name])    Stroke [facility])    Past Surgical History:  Procedure Laterality Date   ARM SURGERY     2013   FOOT SURGERY     2010   HAMSTRING LENGTHENING     TIBIA / FIBIA LENGTHENING     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   Balance problem 05/06/2018   Syncope 05/06/2018   History of cerebrovascular accident 11/18/2017   Anxiety state 06/25/2017   Spasticity 06/25/2017   Allergic rhinitis 10/21/2016   Mild intermittent asthma 10/21/2016    ONSET DATE: 07/09/23- new referral  REFERRING DIAG:  G80.0 (ICD-10-CM) - CP (cerebral palsy), spastic, 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[name])      THERAPY DIAG:  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)  Spastic quadriplegic cerebral palsy ([name])  Other lack of coordination  Abnormal posture  Unsteadiness on feet  Muscle weakness (generalized)  Cerebral palsy with spastic  diplegia ([name])  Left-sided weakness  Rationale for Evaluation and Treatment: Rehabilitation  SUBJECTIVE:                                                                                                                                                                                             SUBJECTIVE STATEMENT:  No falls, mild soreness after PT and mostly tired after PT    Pt accompanied by:  caretaker   PERTINENT HISTORY: CP 07/09/23-- Kerri Robertson is a 23 year old female who presents  for a new physical therapy referral and recent fever with allergy symptoms. She is seeking a new referral for physical therapy, which is required periodically for insurance purposes. She has been attending the same physical therapy location and typically sees a therapist named Kerri Needle, although she has seen different therapists at times. Her physical therapy primarily focuses on strength and balance exercises related to her cerebral palsy.    PAIN:  Are you having pain? No 0/10  PRECAUTIONS: Fall  RED FLAGS: None   WEIGHT BEARING RESTRICTIONS: No  FALLS: Has patient fallen in last 6 months? Yes. Number of falls 4-5  LIVING ENVIRONMENT: Lives with: lives with their family Lives in: House/apartment Stairs: No  PLOF: Independent with basic ADLs  PATIENT GOALS: to be able to get up from the floor, have less falls, work on balance and walking better  OBJECTIVE:  Note: Objective measures were completed at Evaluation unless otherwise noted.  DIAGNOSTIC FINDINGS: IMPRESSION: 1. The acetabulum is relatively shallow and the left femoral head demonstrates an unusual contour. These findings may be developmental secondary to the patient's cerebral palsy. 2. No other abnormalities.  COGNITION: Overall cognitive status: Within functional limits for tasks assessed   SENSATION: WFL  COORDINATION: Decrease coordination d/t CP  MUSCLE TONE: LLE: Mild and Moderate  MUSCLE LENGTH: Hamstrings  and hip flexors are tight on both sides.   POSTURE: rounded shoulders, left pelvic obliquity, and weight shift right  LOWER EXTREMITY ROM:  Trunk lateraly flexed due to scoliosis, L hip unable to achieve neutral ER, abd limited, B hamstrings tight, L ankle does not achieve neutral DF or inversion. Her L foot in turned inwards more, increased IR of hip. LUE with limited ROM at all joints, in all planes. She wears a splint on her wrist at night some nights.   LOWER EXTREMITY MMT:  MMT accuracy is questionable due to tone   MMT Right Eval Left Eval  Hip flexion 4 3+  Hip extension    Hip abduction 4 3+  Hip adduction 4 4-  Hip internal rotation    Hip external rotation    Knee flexion 4 3  Knee extension 4 4  Ankle dorsiflexion    Ankle plantarflexion    Ankle inversion    Ankle eversion    (Blank rows = not tested)  TRANSFERS: Assistive device utilized: None  Sit to stand: Modified independence Stand to sit: Modified independence Chair to chair: Modified independence Floor: Max A  CURB:  Level of Assistance: Modified independence Assistive device utilized: None  STAIRS: Level of Assistance: Modified independence Stair Negotiation Technique: Step to Pattern Alternating Pattern  with Single Rail on Right  GAIT: Gait pattern: step to pattern, decreased arm swing- Left, decreased step length- Left, decreased stance time- Left, decreased stride length, ataxic, trunk rotated posterior- Left, narrow BOS, and poor foot clearance- Left Distance walked: clinic distances Assistive device utilized: None Level of assistance: Modified independence  FUNCTIONAL TESTS:  5 times sit to stand: 14.63s Timed up and go (TUG): 13.06s                                                                                TREATMENT DATE:  08/11/23 Leg press 40# 2x10 both legs, 20# 2x10 with left only PT manually holding left knee and hip in a good position Feet on ball K2c, bridge, rotation Sit ups  2x10 Passive stretch of the left LE Nustep LE only level 5 x 6 minutes Side stepping, backward walking direction changes Volley ball on and off airex 6" toe touches on and off airex On airex reaching Stairs step over step with handrail  08/06/23   Alternating toe taps BOSU + stepping over half foam rolls + forward and backwards figure 8s x2 rounds  Lateral lunges onto BOSU + sideways/diagonal steps over foam rollers + weaving between cones + backwards steps onto 4 inch box with U UE support (2 rounds) Forward step ups x10 + forward/side steps over full and half foam rolls + toe taps on air targets  Nustep L6x8 minutes BLEs only      08/04/23 Walk outdoors around [employer]  Leg press 20# 2x10 with ball in between legs to prevent valgus stress  Rocker board balance On BOSU balance and then reaching for targets NuStep L5x18mins LE only  Partial sit up 2x10 HS stretch 30s x2  Bridges with band 2x10 Volleyball hits    07/28/23 NuStep L5x10mins Bike L3 x87mins  Leg ext 5# 2x10 HS curls 15# 2x10 Fitter pushes 2 blue bands 2x10  Ball squeeze 2x10 Side steps on airex in pbars Standing on airex reaching for different color spike balls  Standing on airex reaching for numbers HS stretching 30s x2   07/16/23 Nustep level 5 x 6 minutes Step turn reach a lot of cues needed On airex reaching for numbers Standing on Bosu upside down balance CGA On rocker board balance CGA LEg press 20# 2x10 ball b/n knees, then left only iwht manual assist for knee alignement Standing volley ball ball kicks Leg curls 15# 2x10 Leg extension 5# 2x10 cues for TKE On airex right arm row and extension Volley ball  Ball kicks Partial sit ups Gait outside int he grass  07/14/23- EVAL    PATIENT EDUCATION: Education details: POC, reviewed HEP with her and Sheppard Penton her caretaker today Person educated: Patient and Caregiver Sheppard Penton Education method: Explanation Education comprehension: verbalized  understanding  HOME EXERCISE PROGRAM: Access Code: [email or web address] URL: [email or web address] Date: 08/04/2023 Prepared by: Cassie Freer  Exercises - Supine Hip Internal and External Rotation  - 1 x daily - 7 x weekly - 3 sets - 10 reps - Supine Hip Abduction on Slider  - 1 x daily - 7 x weekly - 3 sets - 10 reps - Hooklying Clamshell with Resistance  - 1 x daily - 7 x weekly - 3 sets - 10 reps - Hip Abduction and Adduction Caregiver PROM  - 1 x daily - 7 x weekly - 3 sets - 10 reps - Hip Internal and External Rotation Caregiver PROM  - 1 x daily - 7 x weekly - 3 sets - 10 reps - Supine Short Arc Quad  - 1 x daily - 7 x weekly - 3 sets - 10 reps - Supine Bridge  - 1 x daily - 7 x weekly - 3 sets - 10 reps   GOALS: Goals reviewed with patient? Yes  SHORT TERM GOALS: Target date: 08/25/23  Patient will be independent with initial HEP. Baseline: has an old HEP but not doing Goal status: INITIAL  2.  Patient will demonstrate decreased fall risk by scoring < 12 sec on 5xSTS. Baseline: 14.63s Goal status:  INITIAL   LONG TERM GOALS: Target date: 10/06/23  Patient will be independent with advanced/ongoing HEP to improve outcomes and carryover.  Baseline:  Goal status: INITIAL  2.  Patient will be able to complete floor transfer with minA   Baseline: mod-maxA needed when she falls Goal status: INITIAL  3.  Patient will report no falls in a 4 week period   Baseline: "fell last night" 07/14/23 Goal status: INITIAL   4.  Patient will demonstrate improved functional LE strength as demonstrated by 4/5 in LLE. Baseline: see chart Goal status: INITIAL  5.  Patient will ambulate with more normalized gait pattern and less internal rotation of LLE  Baseline: caretakers reports her LLE in turning in more than usual  Goal status: INITIAL   ASSESSMENT:  CLINICAL IMPRESSION:    Continued to push strength balance and overall function, she really has issues with the left hip and  LE with spasticity, we stretch it but try to not over do it as she uses the spasticity for locomotion.  motivated to improve but does have ongoing impairments related to her CP. Has difficulty with foot clearance due to some tone but as expected given her impairments from CP.     Patient is a 23 y.o. female who was seen today for physical therapy evaluation and treatment for CP. She is a returning patient who has no real changes but has had an increase in numbers of falls and has noticed that her LLE turns inwards more than it used to. Her LLE is weak but it is hard to tell how accurate it is due to her tone. Patient will benefit from ongoing strength training and balance to decrease her risk for falls. She will also benefit from stretching to increase her flexibility, especially in her hips.   OBJECTIVE IMPAIRMENTS: Abnormal gait, decreased balance, decreased coordination, decreased endurance, decreased ROM, decreased strength, impaired tone, impaired UE functional use, and postural dysfunction.   ACTIVITY LIMITATIONS: squatting, stairs, transfers, and locomotion level  PERSONAL FACTORS: Age, Behavior pattern, Past/current experiences, and Time since onset of injury/illness/exacerbation are also affecting patient's functional outcome.   REHAB POTENTIAL: Fair chronicity of symptoms  CLINICAL DECISION MAKING: Stable/uncomplicated  EVALUATION COMPLEXITY: Low  PLAN:  PT FREQUENCY: 2x/week  PT DURATION: 12 weeks  PLANNED INTERVENTIONS: 97110-Therapeutic exercises, 97530- Therapeutic activity, 97112- Neuromuscular re-education, 97535- Self Care, 16109- Manual therapy, [phone]- Gait training, Patient/Family education, Balance training, Stair training, Taping, Dry Needling, Joint mobilization, Spinal mobilization, Cryotherapy, and Moist heat  PLAN FOR NEXT SESSION: LE stretching, strengthening hip abd, balance, functional strengthening, work on curbs   Stacie Glaze, PT 08/11/23 1:26 PM

## 2023-08-12 NOTE — Therapy (Signed)
 OUTPATIENT PHYSICAL THERAPY NEURO TREATMENT   Patient Name: Kerri Robertson MRN: [id number] DOB:11-16-2000, 23 y.o., female Today's Date: 08/12/2023   PCP: Hyman Hopes REFERRING PROVIDER: Hyman Hopes  END OF SESSION:       Past Medical History:  Diagnosis Date   Allergy    Cerebral palsy ([name])    CP (cerebral palsy) ([name])    Stroke [facility])    Past Surgical History:  Procedure Laterality Date   ARM SURGERY     2013   FOOT SURGERY     2010   HAMSTRING LENGTHENING     TIBIA / FIBIA LENGTHENING     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  Rationale for Evaluation and Treatment: Rehabilitation  SUBJECTIVE:                                                                                                                                                                                             SUBJECTIVE STATEMENT:  No falls, mild soreness after PT and mostly tired after PT    Pt accompanied by:  caretaker   PERTINENT HISTORY: CP 07/09/23-- Kerri Robertson is a 23 year old female who presents for a new physical therapy referral and recent fever with allergy symptoms. She is seeking a new referral for physical therapy, which is  required periodically for insurance purposes. She has been attending the same physical therapy location and typically sees a therapist named Casimiro Needle, although she has seen different therapists at times. Her physical therapy primarily focuses on strength and balance exercises related to her cerebral palsy.    PAIN:  Are you having pain? No 0/10  PRECAUTIONS: Fall  RED FLAGS: None   WEIGHT BEARING RESTRICTIONS: No  FALLS: Has patient fallen in last 6 months? Yes. Number of falls 4-5  LIVING ENVIRONMENT: Lives with: lives with their family Lives in: House/apartment Stairs: No  PLOF:  Independent with basic ADLs  PATIENT GOALS: to be able to get up from the floor, have less falls, work on balance and walking better  OBJECTIVE:  Note: Objective measures were completed at Evaluation unless otherwise noted.  DIAGNOSTIC FINDINGS: IMPRESSION: 1. The acetabulum is relatively shallow and the left femoral head demonstrates an unusual contour. These findings may be developmental secondary to the patient's cerebral palsy. 2. No other abnormalities.  COGNITION: Overall cognitive status: Within functional limits for tasks assessed   SENSATION: WFL  COORDINATION: Decrease coordination d/t CP  MUSCLE TONE: LLE: Mild and Moderate  MUSCLE LENGTH: Hamstrings and hip flexors are tight on both sides.   POSTURE: rounded shoulders, left pelvic obliquity, and weight shift right  LOWER EXTREMITY ROM:  Trunk lateraly flexed due to scoliosis, L hip unable to achieve neutral ER, abd limited, B hamstrings tight, L ankle does not achieve neutral DF or inversion. Her L foot in turned inwards more, increased IR of hip. LUE with limited ROM at all joints, in all planes. She wears a splint on her wrist at night some nights.   LOWER EXTREMITY MMT:  MMT accuracy is questionable due to tone   MMT Right Eval Left Eval  Hip flexion 4 3+  Hip extension    Hip abduction 4 3+  Hip adduction 4 4-  Hip  internal rotation    Hip external rotation    Knee flexion 4 3  Knee extension 4 4  Ankle dorsiflexion    Ankle plantarflexion    Ankle inversion    Ankle eversion    (Blank rows = not tested)  TRANSFERS: Assistive device utilized: None  Sit to stand: Modified independence Stand to sit: Modified independence Chair to chair: Modified independence Floor: Max A  CURB:  Level of Assistance: Modified independence Assistive device utilized: None  STAIRS: Level of Assistance: Modified independence Stair Negotiation Technique: Step to Pattern Alternating Pattern  with Single Rail on Right  GAIT: Gait pattern: step to pattern, decreased arm swing- Left, decreased step length- Left, decreased stance time- Left, decreased stride length, ataxic, trunk rotated posterior- Left, narrow BOS, and poor foot clearance- Left Distance walked: clinic distances Assistive device utilized: None Level of assistance: Modified independence  FUNCTIONAL TESTS:  5 times sit to stand: 14.63s Timed up and go (TUG): 13.06s                                                                                TREATMENT DATE:  08/13/23 NuStep Box taps 6" 5# Leg ext LLE 2x10 HS curls 15# LLE 2x10 Step ups 4"  Leg press LLE 20# 2x10 STS on airex   08/11/23 Leg press 40# 2x10 both legs, 20# 2x10 with left only PT manually holding left knee and hip in a good position Feet on ball K2c, bridge, rotation Sit ups 2x10 Passive stretch of the left LE Nustep LE only level 5 x 6 minutes Side stepping, backward walking direction changes Volley ball on and off airex 6" toe touches on and off airex On airex reaching Stairs step over step with handrail  08/06/23 Alternating toe taps BOSU + stepping over half foam rolls + forward and backwards figure  8s x2 rounds  Lateral lunges onto BOSU + sideways/diagonal steps over foam rollers + weaving between cones + backwards steps onto 4 inch box with U UE support (2  rounds) Forward step ups x10 + forward/side steps over full and half foam rolls + toe taps on air targets  Nustep L6x8 minutes BLEs only      08/04/23 Walk outdoors around [employer]  Leg press 20# 2x10 with ball in between legs to prevent valgus stress  Rocker board balance On BOSU balance and then reaching for targets NuStep L5x74mins LE only  Partial sit up 2x10 HS stretch 30s x2  Bridges with band 2x10 Volleyball hits    07/28/23 NuStep L5x6mins Bike L3 x46mins  Leg ext 5# 2x10 HS curls 15# 2x10 Fitter pushes 2 blue bands 2x10  Ball squeeze 2x10 Side steps on airex in pbars Standing on airex reaching for different color spike balls  Standing on airex reaching for numbers HS stretching 30s x2   07/16/23 Nustep level 5 x 6 minutes Step turn reach a lot of cues needed On airex reaching for numbers Standing on Bosu upside down balance CGA On rocker board balance CGA LEg press 20# 2x10 ball b/n knees, then left only iwht manual assist for knee alignement Standing volley ball ball kicks Leg curls 15# 2x10 Leg extension 5# 2x10 cues for TKE On airex right arm row and extension Volley ball  Ball kicks Partial sit ups Gait outside int he grass  07/14/23- EVAL    PATIENT EDUCATION: Education details: POC, reviewed HEP with her and Sheppard Penton her caretaker today Person educated: Patient and Caregiver Sheppard Penton Education method: Explanation Education comprehension: verbalized understanding  HOME EXERCISE PROGRAM: Access Code: [email or web address] URL: [email or web address] Date: 08/04/2023 Prepared by: Cassie Freer  Exercises - Supine Hip Internal and External Rotation  - 1 x daily - 7 x weekly - 3 sets - 10 reps - Supine Hip Abduction on Slider  - 1 x daily - 7 x weekly - 3 sets - 10 reps - Hooklying Clamshell with Resistance  - 1 x daily - 7 x weekly - 3 sets - 10 reps - Hip Abduction and Adduction Caregiver PROM  - 1 x daily - 7 x weekly - 3 sets - 10 reps - Hip  Internal and External Rotation Caregiver PROM  - 1 x daily - 7 x weekly - 3 sets - 10 reps - Supine Short Arc Quad  - 1 x daily - 7 x weekly - 3 sets - 10 reps - Supine Bridge  - 1 x daily - 7 x weekly - 3 sets - 10 reps   GOALS: Goals reviewed with patient? Yes  SHORT TERM GOALS: Target date: 08/25/23  Patient will be independent with initial HEP. Baseline: has an old HEP but not doing Goal status: INITIAL  2.  Patient will demonstrate decreased fall risk by scoring < 12 sec on 5xSTS. Baseline: 14.63s Goal status: INITIAL   LONG TERM GOALS: Target date: 10/06/23  Patient will be independent with advanced/ongoing HEP to improve outcomes and carryover.  Baseline:  Goal status: INITIAL  2.  Patient will be able to complete floor transfer with minA   Baseline: mod-maxA needed when she falls Goal status: INITIAL  3.  Patient will report no falls in a 4 week period   Baseline: "fell last night" 07/14/23 Goal status: INITIAL   4.  Patient will demonstrate improved functional LE strength as demonstrated by 4/5 in LLE. Baseline:  see chart Goal status: INITIAL  5.  Patient will ambulate with more normalized gait pattern and less internal rotation of LLE  Baseline: caretakers reports her LLE in turning in more than usual  Goal status: INITIAL   ASSESSMENT:  CLINICAL IMPRESSION:    Continued to push strength balance and overall function, she really has issues with the left hip and LE with spasticity, we stretch it but try to not over do it as she uses the spasticity for locomotion.  motivated to improve but does have ongoing impairments related to her CP. Has difficulty with foot clearance due to some tone but as expected given her impairments from CP.     Patient is a 23 y.o. female who was seen today for physical therapy evaluation and treatment for CP. She is a returning patient who has no real changes but has had an increase in numbers of falls and has noticed that her LLE  turns inwards more than it used to. Her LLE is weak but it is hard to tell how accurate it is due to her tone. Patient will benefit from ongoing strength training and balance to decrease her risk for falls. She will also benefit from stretching to increase her flexibility, especially in her hips.   OBJECTIVE IMPAIRMENTS: Abnormal gait, decreased balance, decreased coordination, decreased endurance, decreased ROM, decreased strength, impaired tone, impaired UE functional use, and postural dysfunction.   ACTIVITY LIMITATIONS: squatting, stairs, transfers, and locomotion level  PERSONAL FACTORS: Age, Behavior pattern, Past/current experiences, and Time since onset of injury/illness/exacerbation are also affecting patient's functional outcome.   REHAB POTENTIAL: Fair chronicity of symptoms  CLINICAL DECISION MAKING: Stable/uncomplicated  EVALUATION COMPLEXITY: Low  PLAN:  PT FREQUENCY: 2x/week  PT DURATION: 12 weeks  PLANNED INTERVENTIONS: 97110-Therapeutic exercises, 97530- Therapeutic activity, 97112- Neuromuscular re-education, 97535- Self Care, 16109- Manual therapy, [phone]- Gait training, Patient/Family education, Balance training, Stair training, Taping, Dry Needling, Joint mobilization, Spinal mobilization, Cryotherapy, and Moist heat  PLAN FOR NEXT SESSION: LE stretching, strengthening hip abd, balance, functional strengthening, work on curbs   Stacie Glaze, PT 08/12/23 9:40 AM

## 2023-08-13 ENCOUNTER — Ambulatory Visit

## 2023-08-13 DIAGNOSIS — R2681 Unsteadiness on feet: Secondary | ICD-10-CM

## 2023-08-13 DIAGNOSIS — R293 Abnormal posture: Secondary | ICD-10-CM

## 2023-08-13 DIAGNOSIS — G800 Spastic quadriplegic cerebral palsy: Secondary | ICD-10-CM

## 2023-08-13 DIAGNOSIS — I69954 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: Secondary | ICD-10-CM

## 2023-08-13 DIAGNOSIS — M6281 Muscle weakness (generalized): Secondary | ICD-10-CM

## 2023-08-13 DIAGNOSIS — R278 Other lack of coordination: Secondary | ICD-10-CM

## 2023-09-01 ENCOUNTER — Ambulatory Visit: Admitting: Physical Therapy

## 2023-09-01 ENCOUNTER — Encounter: Payer: Self-pay | Admitting: Physical Therapy

## 2023-09-01 DIAGNOSIS — G801 Spastic diplegic cerebral palsy: Secondary | ICD-10-CM

## 2023-09-01 DIAGNOSIS — R293 Abnormal posture: Secondary | ICD-10-CM

## 2023-09-01 DIAGNOSIS — M6281 Muscle weakness (generalized): Secondary | ICD-10-CM

## 2023-09-01 DIAGNOSIS — I69954 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: Secondary | ICD-10-CM | POA: Diagnosis not present

## 2023-09-01 DIAGNOSIS — R531 Weakness: Secondary | ICD-10-CM

## 2023-09-01 DIAGNOSIS — R2681 Unsteadiness on feet: Secondary | ICD-10-CM

## 2023-09-01 DIAGNOSIS — G800 Spastic quadriplegic cerebral palsy: Secondary | ICD-10-CM

## 2023-09-01 DIAGNOSIS — R262 Difficulty in walking, not elsewhere classified: Secondary | ICD-10-CM

## 2023-09-01 NOTE — Therapy (Signed)
 OUTPATIENT PHYSICAL THERAPY NEURO TREATMENT   Patient Name: Kerri Robertson MRN: [id number] DOB:[birth year], 23 y.o., female Today's Date: 09/01/2023   PCP: Minna Amass REFERRING PROVIDER: Minna Amass  END OF SESSION:  PT End of Session - 09/01/23 1358     Visit Number 8    Date for PT Re-Evaluation 10/06/23    Authorization Type [employer]    PT Start Time 1355    PT Stop Time 1440    PT Time Calculation (min) 45 min    Activity Tolerance Patient tolerated treatment well    Behavior During Therapy WFL for tasks assessed/performed                 Past Medical History:  Diagnosis Date   Allergy    Cerebral palsy ([name])    CP (cerebral palsy) ([name])    Stroke [facility])    Past Surgical History:  Procedure Laterality Date   ARM SURGERY     2013   FOOT SURGERY     2010   HAMSTRING LENGTHENING     TIBIA / FIBIA LENGTHENING     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   Balance problem 05/06/2018   Syncope 05/06/2018   History of cerebrovascular accident 11/18/2017   Anxiety state 06/25/2017   Spasticity 06/25/2017   Allergic rhinitis 10/21/2016   Mild intermittent asthma 10/21/2016    ONSET DATE: 07/09/23- new referral  REFERRING DIAG:  G80.0 (ICD-10-CM) - CP (cerebral palsy), spastic, 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[name])      THERAPY DIAG:  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)  Spastic quadriplegic cerebral palsy ([name])  Abnormal posture  Unsteadiness on feet  Muscle weakness (generalized)  Cerebral palsy with spastic diplegia ([name])  Left-sided  weakness  Difficulty in walking, not elsewhere classified  Rationale for Evaluation and Treatment: Rehabilitation  SUBJECTIVE:                                                                                                                                                                                             SUBJECTIVE STATEMENT:  Doing okay, denies falls, no pain    Pt accompanied by:  caretaker   PERTINENT HISTORY: CP 07/09/23-- Kerri Robertson is a 23 year old female who presents for a  new physical therapy referral and recent fever with allergy symptoms. She is seeking a new referral for physical therapy, which is required periodically for insurance purposes. She has been attending the same physical therapy location and typically sees a therapist named Bambi Lever, although she has seen different therapists at times. Her physical therapy primarily focuses on strength and balance exercises related to her cerebral palsy.    PAIN:  Are you having pain? No 0/10  PRECAUTIONS: Fall  RED FLAGS: None   WEIGHT BEARING RESTRICTIONS: No  FALLS: Has patient fallen in last 6 months? Yes. Number of falls 4-5  LIVING ENVIRONMENT: Lives with: lives with their family Lives in: House/apartment Stairs: No  PLOF: Independent with basic ADLs  PATIENT GOALS: to be able to get up from the floor, have less falls, work on balance and walking better  OBJECTIVE:  Note: Objective measures were completed at Evaluation unless otherwise noted.  DIAGNOSTIC FINDINGS: IMPRESSION: 1. The acetabulum is relatively shallow and the left femoral head demonstrates an unusual contour. These findings may be developmental secondary to the patient's cerebral palsy. 2. No other abnormalities.  COGNITION: Overall cognitive status: Within functional limits for tasks assessed   SENSATION: WFL  COORDINATION: Decrease coordination d/t CP  MUSCLE TONE: LLE: Mild and Moderate  MUSCLE LENGTH: Hamstrings and  hip flexors are tight on both sides.   POSTURE: rounded shoulders, left pelvic obliquity, and weight shift right  LOWER EXTREMITY ROM:  Trunk lateraly flexed due to scoliosis, L hip unable to achieve neutral ER, abd limited, B hamstrings tight, L ankle does not achieve neutral DF or inversion. Her L foot in turned inwards more, increased IR of hip. LUE with limited ROM at all joints, in all planes. She wears a splint on her wrist at night some nights.   LOWER EXTREMITY MMT:  MMT accuracy is questionable due to tone   MMT Right Eval Left Eval  Hip flexion 4 3+  Hip extension    Hip abduction 4 3+  Hip adduction 4 4-  Hip internal rotation    Hip external rotation    Knee flexion 4 3  Knee extension 4 4  Ankle dorsiflexion    Ankle plantarflexion    Ankle inversion    Ankle eversion    (Blank rows = not tested)  TRANSFERS: Assistive device utilized: None  Sit to stand: Modified independence Stand to sit: Modified independence Chair to chair: Modified independence Floor: Max A  CURB:  Level of Assistance: Modified independence Assistive device utilized: None  STAIRS: Level of Assistance: Modified independence Stair Negotiation Technique: Step to Pattern Alternating Pattern  with Single Rail on Right  GAIT: Gait pattern: step to pattern, decreased arm swing- Left, decreased step length- Left, decreased stance time- Left, decreased stride length, ataxic, trunk rotated posterior- Left, narrow BOS, and poor foot clearance- Left Distance walked: clinic distances Assistive device utilized: None Level of assistance: Modified independence  FUNCTIONAL TESTS:  5 times sit to stand: 14.63s Timed up and go (TUG): 13.06s                                                                                TREATMENT DATE:  09/01/23  Nustep level 5 x 5 minutes Gait outside around the parking [place] then through the grass and uneven surfaces, SBA on asphalt some CGA on grass, one rest  break In Pbars on airex voleey ball, bosu balance 5# HS curls 2x10 5# LAQ cues for TKE verbal tactile and visual 20# LEg press both feet with ball b/n knees, then with PT holding left knee in good position 2x10 with 20# Feet on ball rotation, bridge, isometric abs PROM/stretch of the left LE Supine sliding board left hip abduction  08/13/23 NuStep L5x33mins Box taps 6" 5# Leg ext LLE 2x10 HS curls 5# LLE 2x10 Step ups 4" forward and laterally with 1 HRA STS on airex 2x10 minA  Volleyball hits and kicks  Leg press LLE 20# 2x10 ball in between legs to prevent excessive adduction    08/11/23 Leg press 40# 2x10 both legs, 20# 2x10 with left only PT manually holding left knee and hip in a good position Feet on ball K2c, bridge, rotation Sit ups 2x10 Passive stretch of the left LE Nustep LE only level 5 x 6 minutes Side stepping, backward walking direction changes Volley ball on and off airex 6" toe touches on and off airex On airex reaching Stairs step over step with handrail  08/06/23 Alternating toe taps BOSU + stepping over half foam rolls + forward and backwards figure 8s x2 rounds  Lateral lunges onto BOSU + sideways/diagonal steps over foam rollers + weaving between cones + backwards steps onto 4 inch box with U UE support (2 rounds) Forward step ups x10 + forward/side steps over full and half foam rolls + toe taps on air targets  Nustep L6x8 minutes BLEs only      08/04/23 Walk outdoors around [employer]  Leg press 20# 2x10 with ball in between legs to prevent valgus stress  Rocker board balance On BOSU balance and then reaching for targets NuStep L5x104mins LE only  Partial sit up 2x10 HS stretch 30s x2  Bridges with band 2x10 Volleyball hits    07/28/23 NuStep L5x70mins Bike L3 x22mins  Leg ext 5# 2x10 HS curls 15# 2x10 Fitter pushes 2 blue bands 2x10  Ball squeeze 2x10 Side steps on airex in pbars Standing on airex reaching for different color spike balls   Standing on airex reaching for numbers HS stretching 30s x2   07/16/23 Nustep level 5 x 6 minutes Step turn reach a lot of cues needed On airex reaching for numbers Standing on Bosu upside down balance CGA On rocker board balance CGA LEg press 20# 2x10 ball b/n knees, then left only iwht manual assist for knee alignement Standing volley ball ball kicks Leg curls 15# 2x10 Leg extension 5# 2x10 cues for TKE On airex right arm row and extension Volley ball  Ball kicks Partial sit ups Gait outside int he grass  07/14/23- EVAL    PATIENT EDUCATION: Education details: POC, reviewed HEP with her and Bernett Brill her caretaker today Person educated: Patient and Caregiver Bernett Brill Education method: Explanation Education comprehension: verbalized understanding  HOME EXERCISE PROGRAM: Access Code: [email or web address] URL: [email or web address] Date: 08/04/2023 Prepared by: Donavon Fudge  Exercises - Supine Hip Internal and External Rotation  - 1 x daily - 7 x weekly - 3 sets - 10 reps - Supine Hip Abduction on Slider  - 1 x daily - 7 x weekly - 3 sets - 10 reps - Hooklying Clamshell with Resistance  - 1 x daily - 7 x weekly - 3 sets -  10 reps - Hip Abduction and Adduction Caregiver PROM  - 1 x daily - 7 x weekly - 3 sets - 10 reps - Hip Internal and External Rotation Caregiver PROM  - 1 x daily - 7 x weekly - 3 sets - 10 reps - Supine Short Arc Quad  - 1 x daily - 7 x weekly - 3 sets - 10 reps - Supine Bridge  - 1 x daily - 7 x weekly - 3 sets - 10 reps   GOALS: Goals reviewed with patient? Yes  SHORT TERM GOALS: Target date: 08/25/23  Patient will be independent with initial HEP. Baseline: has an old HEP but not doing Goal status:met 09/01/23  2.  Patient will demonstrate decreased fall risk by scoring < 12 sec on 5xSTS. Baseline: 14.63s Goal status: INITIAL   LONG TERM GOALS: Target date: 10/06/23  Patient will be independent with advanced/ongoing HEP to improve outcomes and  carryover.  Baseline:  Goal status: INITIAL  2.  Patient will be able to complete floor transfer with minA   Baseline: mod-maxA needed when she falls Goal status: ongoing 09/01/23  3.  Patient will report no falls in a 4 week period   Baseline: "fell last night" 07/14/23 Goal status: INITIAL   4.  Patient will demonstrate improved functional LE strength as demonstrated by 4/5 in LLE. Baseline: see chart Goal status: INITIAL  5.  Patient will ambulate with more normalized gait pattern and less internal rotation of LLE  Baseline: caretakers reports her LLE in turning in more than usual  Goal status: INITIAL   ASSESSMENT:  CLINICAL IMPRESSION: Continued to push strength balance and overall function, she still has issues with the left hip and LE with spasticity. I worked a little more with her on flexibility of the left LE, just to help with easier movements and motions but not trying to over do it as she uses her spasticity for locomotion  Patient is a 24 y.o. female who was seen today for physical therapy evaluation and treatment for CP. She is a returning patient who has no real changes but has had an increase in numbers of falls and has noticed that her LLE turns inwards more than it used to. Her LLE is weak but it is hard to tell how accurate it is due to her tone. Patient will benefit from ongoing strength training and balance to decrease her risk for falls. She will also benefit from stretching to increase her flexibility, especially in her hips.   OBJECTIVE IMPAIRMENTS: Abnormal gait, decreased balance, decreased coordination, decreased endurance, decreased ROM, decreased strength, impaired tone, impaired UE functional use, and postural dysfunction.   ACTIVITY LIMITATIONS: squatting, stairs, transfers, and locomotion level  PERSONAL FACTORS: Age, Behavior pattern, Past/current experiences, and Time since onset of injury/illness/exacerbation are also affecting patient's functional  outcome.   REHAB POTENTIAL: Fair chronicity of symptoms  CLINICAL DECISION MAKING: Stable/uncomplicated  EVALUATION COMPLEXITY: Low  PLAN:  PT FREQUENCY: 2x/week  PT DURATION: 12 weeks  PLANNED INTERVENTIONS: 97110-Therapeutic exercises, 97530- Therapeutic activity, 97112- Neuromuscular re-education, 97535- Self Care, 82956- Manual therapy, [phone]- Gait training, Patient/Family education, Balance training, Stair training, Taping, Dry Needling, Joint mobilization, Spinal mobilization, Cryotherapy, and Moist heat  PLAN FOR NEXT SESSION: LE stretching, strengthening hip abd, balance, functional strengthening, work on curbs   Cherylene Corrente, PT 09/01/23 1:59 PM

## 2023-09-03 ENCOUNTER — Ambulatory Visit: Attending: Family Medicine

## 2023-09-03 DIAGNOSIS — G800 Spastic quadriplegic cerebral palsy: Secondary | ICD-10-CM | POA: Insufficient documentation

## 2023-09-03 DIAGNOSIS — R262 Difficulty in walking, not elsewhere classified: Secondary | ICD-10-CM | POA: Diagnosis present

## 2023-09-03 DIAGNOSIS — R293 Abnormal posture: Secondary | ICD-10-CM | POA: Diagnosis present

## 2023-09-03 DIAGNOSIS — G801 Spastic diplegic cerebral palsy: Secondary | ICD-10-CM | POA: Insufficient documentation

## 2023-09-03 DIAGNOSIS — I69954 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: Secondary | ICD-10-CM | POA: Diagnosis present

## 2023-09-03 DIAGNOSIS — R2681 Unsteadiness on feet: Secondary | ICD-10-CM | POA: Diagnosis present

## 2023-09-03 DIAGNOSIS — R531 Weakness: Secondary | ICD-10-CM | POA: Insufficient documentation

## 2023-09-03 DIAGNOSIS — M6281 Muscle weakness (generalized): Secondary | ICD-10-CM | POA: Diagnosis present

## 2023-09-03 NOTE — Therapy (Signed)
 OUTPATIENT PHYSICAL THERAPY NEURO TREATMENT   Patient Name: Kerri Robertson MRN: [id number] DOB:22-Feb-2001, 23 y.o., female Today's Date: 09/03/2023   PCP: Minna Amass REFERRING PROVIDER: Minna Amass  END OF SESSION:  PT End of Session - 09/03/23 1217     Visit Number 9    Date for PT Re-Evaluation 10/06/23    Authorization Type [employer]    PT Start Time 1232    PT Stop Time 1317    PT Time Calculation (min) 45 min    Activity Tolerance Patient tolerated treatment well    Behavior During Therapy [facility] for tasks assessed/performed                  Past Medical History:  Diagnosis Date   Allergy    Cerebral palsy ([name])    CP (cerebral palsy) ([name])    Stroke [facility])    Past Surgical History:  Procedure Laterality Date   ARM SURGERY     2013   FOOT SURGERY     2010   HAMSTRING LENGTHENING     TIBIA / FIBIA LENGTHENING     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   Balance problem 05/06/2018   Syncope 05/06/2018   History of cerebrovascular accident 11/18/2017   Anxiety state 06/25/2017   Spasticity 06/25/2017   Allergic rhinitis 10/21/2016   Mild intermittent asthma 10/21/2016    ONSET DATE: 07/09/23- new referral  REFERRING DIAG:  G80.0 (ICD-10-CM) - CP (cerebral palsy), spastic, 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[name])      THERAPY DIAG:  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)  Spastic quadriplegic cerebral palsy ([name])  Abnormal posture  Unsteadiness on feet  Muscle weakness (generalized)  Cerebral palsy with spastic diplegia ([name])  Rationale  for Evaluation and Treatment: Rehabilitation  SUBJECTIVE:                                                                                                                                                                                             SUBJECTIVE STATEMENT:  Doing okay, denies falls, no pain    Pt accompanied by:  caretaker   PERTINENT HISTORY: CP 07/09/23-- Kerri Robertson is a 23 year old female who presents for a new physical therapy referral and recent fever with allergy  symptoms. She is seeking a new referral for physical therapy, which is required periodically for insurance purposes. She has been attending the same physical therapy location and typically sees a therapist named Bambi Lever, although she has seen different therapists at times. Her physical therapy primarily focuses on strength and balance exercises related to her cerebral palsy.    PAIN:  Are you having pain? No 0/10  PRECAUTIONS: Fall  RED FLAGS: None   WEIGHT BEARING RESTRICTIONS: No  FALLS: Has patient fallen in last 6 months? Yes. Number of falls 4-5  LIVING ENVIRONMENT: Lives with: lives with their family Lives in: House/apartment Stairs: No  PLOF: Independent with basic ADLs  PATIENT GOALS: to be able to get up from the floor, have less falls, work on balance and walking better  OBJECTIVE:  Note: Objective measures were completed at Evaluation unless otherwise noted.  DIAGNOSTIC FINDINGS: IMPRESSION: 1. The acetabulum is relatively shallow and the left femoral head demonstrates an unusual contour. These findings may be developmental secondary to the patient's cerebral palsy. 2. No other abnormalities.  COGNITION: Overall cognitive status: Within functional limits for tasks assessed   SENSATION: WFL  COORDINATION: Decrease coordination d/t CP  MUSCLE TONE: LLE: Mild and Moderate  MUSCLE LENGTH: Hamstrings and hip flexors are tight on both sides.   POSTURE: rounded shoulders, left  pelvic obliquity, and weight shift right  LOWER EXTREMITY ROM:  Trunk lateraly flexed due to scoliosis, L hip unable to achieve neutral ER, abd limited, B hamstrings tight, L ankle does not achieve neutral DF or inversion. Her L foot in turned inwards more, increased IR of hip. LUE with limited ROM at all joints, in all planes. She wears a splint on her wrist at night some nights.   LOWER EXTREMITY MMT:  MMT accuracy is questionable due to tone   MMT Right Eval Left Eval  Hip flexion 4 3+  Hip extension    Hip abduction 4 3+  Hip adduction 4 4-  Hip internal rotation    Hip external rotation    Knee flexion 4 3  Knee extension 4 4  Ankle dorsiflexion    Ankle plantarflexion    Ankle inversion    Ankle eversion    (Blank rows = not tested)  TRANSFERS: Assistive device utilized: None  Sit to stand: Modified independence Stand to sit: Modified independence Chair to chair: Modified independence Floor: Max A  CURB:  Level of Assistance: Modified independence Assistive device utilized: None  STAIRS: Level of Assistance: Modified independence Stair Negotiation Technique: Step to Pattern Alternating Pattern  with Single Rail on Right  GAIT: Gait pattern: step to pattern, decreased arm swing- Left, decreased step length- Left, decreased stance time- Left, decreased stride length, ataxic, trunk rotated posterior- Left, narrow BOS, and poor foot clearance- Left Distance walked: clinic distances Assistive device utilized: None Level of assistance: Modified independence  FUNCTIONAL TESTS:  5 times sit to stand: 14.63s Timed up and go (TUG): 13.06s                                                                                TREATMENT DATE:  09/03/23 NuStep L5x30mins 5xSTS goal- 11.19s Step over 4" by  stairs  Calf stretch 30s  Calf raises 2x10 on floor (unable to keep L foot on bar)  On airex straight arm pull minA  Feet on ball rotation, bridge, knees to chest x10 AB roll  up with ball, small crunches 2x10 PROM/stretch of the left LE- HS, butterfly, IR/ER, ITB  Supine sliding board left hip abduction SLR 2x10    09/01/23 Nustep level 5 x 5 minutes Gait outside around the parking [place] then through the grass and uneven surfaces, SBA on asphalt some CGA on grass, one rest break In Pbars on airex voleey ball, bosu balance 5# HS curls 2x10 5# LAQ cues for TKE verbal tactile and visual 20# LEg press both feet with ball b/n knees, then with PT holding left knee in good position 2x10 with 20# Feet on ball rotation, bridge, isometric abs PROM/stretch of the left LE Supine sliding board left hip abduction  08/13/23 NuStep L5x65mins Box taps 6" 5# Leg ext LLE 2x10 HS curls 5# LLE 2x10 Step ups 4" forward and laterally with 1 HRA STS on airex 2x10 minA  Volleyball hits and kicks  Leg press LLE 20# 2x10 ball in between legs to prevent excessive adduction    08/11/23 Leg press 40# 2x10 both legs, 20# 2x10 with left only PT manually holding left knee and hip in a good position Feet on ball K2c, bridge, rotation Sit ups 2x10 Passive stretch of the left LE Nustep LE only level 5 x 6 minutes Side stepping, backward walking direction changes Volley ball on and off airex 6" toe touches on and off airex On airex reaching Stairs step over step with handrail  08/06/23 Alternating toe taps BOSU + stepping over half foam rolls + forward and backwards figure 8s x2 rounds  Lateral lunges onto BOSU + sideways/diagonal steps over foam rollers + weaving between cones + backwards steps onto 4 inch box with U UE support (2 rounds) Forward step ups x10 + forward/side steps over full and half foam rolls + toe taps on air targets  Nustep L6x8 minutes BLEs only      08/04/23 Walk outdoors around [employer]  Leg press 20# 2x10 with ball in between legs to prevent valgus stress  Rocker board balance On BOSU balance and then reaching for targets NuStep L5x37mins LE only   Partial sit up 2x10 HS stretch 30s x2  Bridges with band 2x10 Volleyball hits    07/28/23 NuStep L5x18mins Bike L3 x64mins  Leg ext 5# 2x10 HS curls 15# 2x10 Fitter pushes 2 blue bands 2x10  Ball squeeze 2x10 Side steps on airex in pbars Standing on airex reaching for different color spike balls  Standing on airex reaching for numbers HS stretching 30s x2   07/16/23 Nustep level 5 x 6 minutes Step turn reach a lot of cues needed On airex reaching for numbers Standing on Bosu upside down balance CGA On rocker board balance CGA LEg press 20# 2x10 ball b/n knees, then left only iwht manual assist for knee alignement Standing volley ball ball kicks Leg curls 15# 2x10 Leg extension 5# 2x10 cues for TKE On airex right arm row and extension Volley ball  Ball kicks Partial sit ups Gait outside int he grass  07/14/23- EVAL    PATIENT EDUCATION: Education details: POC, reviewed HEP with her and Bernett Brill her caretaker today Person educated: Patient and Caregiver Bernett Brill Education method: Explanation Education comprehension: verbalized understanding  HOME EXERCISE PROGRAM: Access Code: [email or web address] URL: [email or web address] Date: 08/04/2023 Prepared  by: Donavon Fudge  Exercises - Supine Hip Internal and External Rotation  - 1 x daily - 7 x weekly - 3 sets - 10 reps - Supine Hip Abduction on Slider  - 1 x daily - 7 x weekly - 3 sets - 10 reps - Hooklying Clamshell with Resistance  - 1 x daily - 7 x weekly - 3 sets - 10 reps - Hip Abduction and Adduction Caregiver PROM  - 1 x daily - 7 x weekly - 3 sets - 10 reps - Hip Internal and External Rotation Caregiver PROM  - 1 x daily - 7 x weekly - 3 sets - 10 reps - Supine Short Arc Quad  - 1 x daily - 7 x weekly - 3 sets - 10 reps - Supine Bridge  - 1 x daily - 7 x weekly - 3 sets - 10 reps   GOALS: Goals reviewed with patient? Yes  SHORT TERM GOALS: Target date: 08/25/23  Patient will be independent with initial  HEP. Baseline: has an old HEP but not doing Goal status:met 09/01/23  2.  Patient will demonstrate decreased fall risk by scoring < 12 sec on 5xSTS. Baseline: 14.63s Goal status: MET 11.19s 09/03/23   LONG TERM GOALS: Target date: 10/06/23  Patient will be independent with advanced/ongoing HEP to improve outcomes and carryover.  Baseline:  Goal status: INITIAL  2.  Patient will be able to complete floor transfer with minA   Baseline: mod-maxA needed when she falls Goal status: ongoing 09/01/23  3.  Patient will report no falls in a 4 week period   Baseline: "fell last night" 07/14/23 Goal status: INITIAL   4.  Patient will demonstrate improved functional LE strength as demonstrated by 4/5 in LLE. Baseline: see chart Goal status: INITIAL  5.  Patient will ambulate with more normalized gait pattern and less internal rotation of LLE  Baseline: caretakers reports her LLE in turning in more than usual  Goal status: INITIAL   ASSESSMENT:  CLINICAL IMPRESSION: Continued to push strength balance and overall function, she still has issues with the left hip and LE with spasticity. With step overs on stairs her LLE circumducts  and she needs cues to try to lift straight up but this is hard for her. She seems to be a little bit more flexible but some of it is needed as she uses her spasticity for locomotion. Has a hard time keeping knee straight with SLR on both sides.   Patient is a 23 y.o. female who was seen today for physical therapy evaluation and treatment for CP. She is a returning patient who has no real changes but has had an increase in numbers of falls and has noticed that her LLE turns inwards more than it used to. Her LLE is weak but it is hard to tell how accurate it is due to her tone. Patient will benefit from ongoing strength training and balance to decrease her risk for falls. She will also benefit from stretching to increase her flexibility, especially in her hips.   OBJECTIVE  IMPAIRMENTS: Abnormal gait, decreased balance, decreased coordination, decreased endurance, decreased ROM, decreased strength, impaired tone, impaired UE functional use, and postural dysfunction.   ACTIVITY LIMITATIONS: squatting, stairs, transfers, and locomotion level  PERSONAL FACTORS: Age, Behavior pattern, Past/current experiences, and Time since onset of injury/illness/exacerbation are also affecting patient's functional outcome.   REHAB POTENTIAL: Fair chronicity of symptoms  CLINICAL DECISION MAKING: Stable/uncomplicated  EVALUATION COMPLEXITY: Low  PLAN:  PT FREQUENCY: 2x/week  PT DURATION: 12 weeks  PLANNED INTERVENTIONS: 97110-Therapeutic exercises, 97530- Therapeutic activity, 97112- Neuromuscular re-education, 97535- Self Care, [id number]- Manual therapy, [phone]- Gait training, Patient/Family education, Balance training, Stair training, Taping, Dry Needling, Joint mobilization, Spinal mobilization, Cryotherapy, and Moist heat  PLAN FOR NEXT SESSION: LE stretching, strengthening hip abd, balance, functional strengthening, work on curbs  10th visit progress note   Donavon Fudge, PT, DPT 09/03/23 1:18 PM

## 2023-09-08 ENCOUNTER — Encounter: Payer: Self-pay | Admitting: Physical Therapy

## 2023-09-08 ENCOUNTER — Ambulatory Visit: Admitting: Physical Therapy

## 2023-09-08 DIAGNOSIS — R531 Weakness: Secondary | ICD-10-CM

## 2023-09-08 DIAGNOSIS — I69954 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: Secondary | ICD-10-CM

## 2023-09-08 DIAGNOSIS — M6281 Muscle weakness (generalized): Secondary | ICD-10-CM

## 2023-09-08 DIAGNOSIS — G801 Spastic diplegic cerebral palsy: Secondary | ICD-10-CM

## 2023-09-08 DIAGNOSIS — G800 Spastic quadriplegic cerebral palsy: Secondary | ICD-10-CM

## 2023-09-08 DIAGNOSIS — R2681 Unsteadiness on feet: Secondary | ICD-10-CM

## 2023-09-08 DIAGNOSIS — R293 Abnormal posture: Secondary | ICD-10-CM

## 2023-09-08 DIAGNOSIS — R262 Difficulty in walking, not elsewhere classified: Secondary | ICD-10-CM

## 2023-09-08 NOTE — Therapy (Signed)
 OUTPATIENT PHYSICAL THERAPY NEURO TREATMENT   Patient Name: Kerri Robertson MRN: [id number] DOB:Dec 29, 2000, 23 y.o., female Today's Date: 09/08/2023   PCP: Minna Amass REFERRING PROVIDER: Minna Amass  END OF SESSION:  PT End of Session - 09/08/23 1317     Visit Number 10    Date for PT Re-Evaluation 10/06/23    Authorization Type [employer]    PT Start Time 1315    PT Stop Time 1400    PT Time Calculation (min) 45 min    Activity Tolerance Patient tolerated treatment well    Behavior During Therapy WFL for tasks assessed/performed                  Past Medical History:  Diagnosis Date   Allergy    Cerebral palsy ([name])    CP (cerebral palsy) ([name])    Stroke [facility])    Past Surgical History:  Procedure Laterality Date   ARM SURGERY     2013   FOOT SURGERY     2010   HAMSTRING LENGTHENING     TIBIA / FIBIA LENGTHENING     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   Balance problem 05/06/2018   Syncope 05/06/2018   History of cerebrovascular accident 11/18/2017   Anxiety state 06/25/2017   Spasticity 06/25/2017   Allergic rhinitis 10/21/2016   Mild intermittent asthma 10/21/2016    ONSET DATE: 07/09/23- new referral  REFERRING DIAG:  G80.0 (ICD-10-CM) - CP (cerebral palsy), spastic, 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[name])      THERAPY DIAG:  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)  Spastic quadriplegic cerebral palsy ([name])  Abnormal posture  Unsteadiness on feet  Muscle weakness (generalized)  Cerebral palsy with spastic diplegia  ([name])  Left-sided weakness  Difficulty in walking, not elsewhere classified  Rationale for Evaluation and Treatment: Rehabilitation  SUBJECTIVE:                                                                                                                                                                                             SUBJECTIVE STATEMENT:  Reports doing well, no problems  Pt accompanied by:  caretaker   PERTINENT HISTORY: CP 07/09/23-- Kerri Robertson is a 23 year old female who presents for a new physical  therapy referral and recent fever with allergy symptoms. She is seeking a new referral for physical therapy, which is required periodically for insurance purposes. She has been attending the same physical therapy location and typically sees a therapist named Bambi Lever, although she has seen different therapists at times. Her physical therapy primarily focuses on strength and balance exercises related to her cerebral palsy.    PAIN:  Are you having pain? No 0/10  PRECAUTIONS: Fall  RED FLAGS: None   WEIGHT BEARING RESTRICTIONS: No  FALLS: Has patient fallen in last 6 months? Yes. Number of falls 4-5  LIVING ENVIRONMENT: Lives with: lives with their family Lives in: House/apartment Stairs: No  PLOF: Independent with basic ADLs  PATIENT GOALS: to be able to get up from the floor, have less falls, work on balance and walking better  OBJECTIVE:  Note: Objective measures were completed at Evaluation unless otherwise noted.  DIAGNOSTIC FINDINGS: IMPRESSION: 1. The acetabulum is relatively shallow and the left femoral head demonstrates an unusual contour. These findings may be developmental secondary to the patient's cerebral palsy. 2. No other abnormalities.  COGNITION: Overall cognitive status: Within functional limits for tasks assessed   SENSATION: WFL  COORDINATION: Decrease coordination d/t CP  MUSCLE TONE: LLE: Mild and Moderate  MUSCLE  LENGTH: Hamstrings and hip flexors are tight on both sides.   POSTURE: rounded shoulders, left pelvic obliquity, and weight shift right  LOWER EXTREMITY ROM:  Trunk lateraly flexed due to scoliosis, L hip unable to achieve neutral ER, abd limited, B hamstrings tight, L ankle does not achieve neutral DF or inversion. Her L foot in turned inwards more, increased IR of hip. LUE with limited ROM at all joints, in all planes. She wears a splint on her wrist at night some nights.   LOWER EXTREMITY MMT:  MMT accuracy is questionable due to tone   MMT Right Eval Left Eval  Hip flexion 4 3+  Hip extension    Hip abduction 4 3+  Hip adduction 4 4-  Hip internal rotation    Hip external rotation    Knee flexion 4 3  Knee extension 4 4  Ankle dorsiflexion    Ankle plantarflexion    Ankle inversion    Ankle eversion    (Blank rows = not tested)  TRANSFERS: Assistive device utilized: None  Sit to stand: Modified independence Stand to sit: Modified independence Chair to chair: Modified independence Floor: Max A  CURB:  Level of Assistance: Modified independence Assistive device utilized: None  STAIRS: Level of Assistance: Modified independence Stair Negotiation Technique: Step to Pattern Alternating Pattern  with Single Rail on Right  GAIT: Gait pattern: step to pattern, decreased arm swing- Left, decreased step length- Left, decreased stance time- Left, decreased stride length, ataxic, trunk rotated posterior- Left, narrow BOS, and poor foot clearance- Left Distance walked: clinic distances Assistive device utilized: None Level of assistance: Modified independence  FUNCTIONAL TESTS:  5 times sit to stand: 14.63s Timed up and go (TUG): 13.06s                                                                                TREATMENT DATE:  09/08/23 Nustep level  5 x 7 minutes Step over step 6" and 4" On rockerboard two ways reaching On bosu balance Gait outside uneven  terrain Passive stretch left LE hip, knee and calf Feet on ball K2C, trunk rotation, bridges, isometric abs Sliding board hip abduction  09/03/23 NuStep L5x69mins 5xSTS goal- 11.19s Step over 4" by stairs  Calf stretch 30s  Calf raises 2x10 on floor (unable to keep L foot on bar)  On airex straight arm pull minA  Feet on ball rotation, bridge, knees to chest x10 AB roll up with ball, small crunches 2x10 PROM/stretch of the left LE- HS, butterfly, IR/ER, ITB  Supine sliding board left hip abduction SLR 2x10    09/01/23 Nustep level 5 x 5 minutes Gait outside around the parking [place] then through the grass and uneven surfaces, SBA on asphalt some CGA on grass, one rest break In Pbars on airex voleey ball, bosu balance 5# HS curls 2x10 5# LAQ cues for TKE verbal tactile and visual 20# LEg press both feet with ball b/n knees, then with PT holding left knee in good position 2x10 with 20# Feet on ball rotation, bridge, isometric abs PROM/stretch of the left LE Supine sliding board left hip abduction  08/13/23 NuStep L5x79mins Box taps 6" 5# Leg ext LLE 2x10 HS curls 5# LLE 2x10 Step ups 4" forward and laterally with 1 HRA STS on airex 2x10 minA  Volleyball hits and kicks  Leg press LLE 20# 2x10 ball in between legs to prevent excessive adduction    08/11/23 Leg press 40# 2x10 both legs, 20# 2x10 with left only PT manually holding left knee and hip in a good position Feet on ball K2c, bridge, rotation Sit ups 2x10 Passive stretch of the left LE Nustep LE only level 5 x 6 minutes Side stepping, backward walking direction changes Volley ball on and off airex 6" toe touches on and off airex On airex reaching Stairs step over step with handrail  08/06/23 Alternating toe taps BOSU + stepping over half foam rolls + forward and backwards figure 8s x2 rounds  Lateral lunges onto BOSU + sideways/diagonal steps over foam rollers + weaving between cones + backwards steps onto 4 inch box  with U UE support (2 rounds) Forward step ups x10 + forward/side steps over full and half foam rolls + toe taps on air targets  Nustep L6x8 minutes BLEs only      08/04/23 Walk outdoors around [employer]  Leg press 20# 2x10 with ball in between legs to prevent valgus stress  Rocker board balance On BOSU balance and then reaching for targets NuStep L5x69mins LE only  Partial sit up 2x10 HS stretch 30s x2  Bridges with band 2x10 Volleyball hits    07/28/23 NuStep L5x43mins Bike L3 x78mins  Leg ext 5# 2x10 HS curls 15# 2x10 Fitter pushes 2 blue bands 2x10  Ball squeeze 2x10 Side steps on airex in pbars Standing on airex reaching for different color spike balls  Standing on airex reaching for numbers HS stretching 30s x2   07/16/23 Nustep level 5 x 6 minutes Step turn reach a lot of cues needed On airex reaching for numbers Standing on Bosu upside down balance CGA On rocker board balance CGA LEg press 20# 2x10 ball b/n knees, then left only iwht manual assist for knee alignement Standing volley ball ball kicks Leg curls 15# 2x10 Leg extension 5# 2x10 cues for TKE On airex right arm row and extension Volley ball  Ball kicks Partial  sit ups Gait outside int he grass  07/14/23- EVAL    PATIENT EDUCATION: Education details: POC, reviewed HEP with her and Bernett Brill her caretaker today Person educated: Patient and Caregiver Bernett Brill Education method: Explanation Education comprehension: verbalized understanding  HOME EXERCISE PROGRAM: Access Code: [email or web address] URL: [email or web address] Date: 08/04/2023 Prepared by: Donavon Fudge  Exercises - Supine Hip Internal and External Rotation  - 1 x daily - 7 x weekly - 3 sets - 10 reps - Supine Hip Abduction on Slider  - 1 x daily - 7 x weekly - 3 sets - 10 reps - Hooklying Clamshell with Resistance  - 1 x daily - 7 x weekly - 3 sets - 10 reps - Hip Abduction and Adduction Caregiver PROM  - 1 x daily - 7 x weekly - 3  sets - 10 reps - Hip Internal and External Rotation Caregiver PROM  - 1 x daily - 7 x weekly - 3 sets - 10 reps - Supine Short Arc Quad  - 1 x daily - 7 x weekly - 3 sets - 10 reps - Supine Bridge  - 1 x daily - 7 x weekly - 3 sets - 10 reps   GOALS: Goals reviewed with patient? Yes  SHORT TERM GOALS: Target date: 08/25/23  Patient will be independent with initial HEP. Baseline: has an old HEP but not doing Goal status:met 09/01/23  2.  Patient will demonstrate decreased fall risk by scoring < 12 sec on 5xSTS. Baseline: 14.63s Goal status: MET 11.19s 09/03/23   LONG TERM GOALS: Target date: 10/06/23  Patient will be independent with advanced/ongoing HEP to improve outcomes and carryover.  Baseline:  Goal status: progressing 09/08/23  2.  Patient will be able to complete floor transfer with minA   Baseline: mod-maxA needed when she falls Goal status: ongoing 09/01/23  3.  Patient will report no falls in a 4 week period   Baseline: "fell last night" 07/14/23 Goal status: progressing 09/08/23  4.  Patient will demonstrate improved functional LE strength as demonstrated by 4/5 in LLE. Baseline: see chart Goal status: INITIAL  5.  Patient will ambulate with more normalized gait pattern and less internal rotation of LLE  Baseline: caretakers reports her LLE in turning in more than usual  Goal status: INITIAL   ASSESSMENT:  CLINICAL IMPRESSION: I continue to work on balance, strength, gait and flexibility, her ability to abduct the hip is much improved, she seems to have a little better left LE control, still very tight and spastic  Patient is a 23 y.o. female who was seen today for physical therapy evaluation and treatment for CP. She is a returning patient who has no real changes but has had an increase in numbers of falls and has noticed that her LLE turns inwards more than it used to. Her LLE is weak but it is hard to tell how accurate it is due to her tone. Patient will benefit from  ongoing strength training and balance to decrease her risk for falls. She will also benefit from stretching to increase her flexibility, especially in her hips.   OBJECTIVE IMPAIRMENTS: Abnormal gait, decreased balance, decreased coordination, decreased endurance, decreased ROM, decreased strength, impaired tone, impaired UE functional use, and postural dysfunction.   ACTIVITY LIMITATIONS: squatting, stairs, transfers, and locomotion level  PERSONAL FACTORS: Age, Behavior pattern, Past/current experiences, and Time since onset of injury/illness/exacerbation are also affecting patient's functional outcome.   REHAB POTENTIAL: Fair chronicity of symptoms  CLINICAL DECISION  MAKING: Stable/uncomplicated  EVALUATION COMPLEXITY: Low  PLAN:  PT FREQUENCY: 2x/week  PT DURATION: 12 weeks  PLANNED INTERVENTIONS: 97110-Therapeutic exercises, 97530- Therapeutic activity, 97112- Neuromuscular re-education, 97535- Self Care, [id number]- Manual therapy, [phone]- Gait training, Patient/Family education, Balance training, Stair training, Taping, Dry Needling, Joint mobilization, Spinal mobilization, Cryotherapy, and Moist heat  PLAN FOR NEXT SESSION: LE stretching, strengthening hip abd, balance, functional strengthening, work on curbs  10th visit progress note   Cherylene Corrente, PT 09/08/23 1:19 PM

## 2023-09-09 NOTE — Therapy (Signed)
 OUTPATIENT PHYSICAL THERAPY NEURO TREATMENT   Patient Name: Kerri Robertson MRN: [id number] DOB:May 11, 2000, 23 y.o., female Today's Date: 09/10/2023   PCP: Minna Amass REFERRING PROVIDER: Minna Amass  END OF SESSION:  PT End of Session - 09/10/23 1230     Visit Number 11    Date for PT Re-Evaluation 10/06/23    Authorization Type [employer]    PT Start Time 1230    PT Stop Time 1315    PT Time Calculation (min) 45 min    Activity Tolerance Patient tolerated treatment well    Behavior During Therapy WFL for tasks assessed/performed                   Past Medical History:  Diagnosis Date   Allergy    Cerebral palsy ([name])    CP (cerebral palsy) ([name])    Stroke [facility])    Past Surgical History:  Procedure Laterality Date   ARM SURGERY     2013   FOOT SURGERY     2010   HAMSTRING LENGTHENING     TIBIA / FIBIA LENGTHENING     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   Balance problem 05/06/2018   Syncope 05/06/2018   History of cerebrovascular accident 11/18/2017   Anxiety state 06/25/2017   Spasticity 06/25/2017   Allergic rhinitis 10/21/2016   Mild intermittent asthma 10/21/2016    ONSET DATE: 07/09/23- new referral  REFERRING DIAG:  G80.0 (ICD-10-CM) - CP (cerebral palsy), spastic, 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[name])      THERAPY DIAG:  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)  Spastic quadriplegic cerebral palsy ([name])  Abnormal posture  Unsteadiness on feet  Muscle weakness (generalized)  Cerebral palsy with spastic diplegia  ([name])  Rationale for Evaluation and Treatment: Rehabilitation  SUBJECTIVE:                                                                                                                                                                                             SUBJECTIVE STATEMENT:  Nothing new, no falls   Pt accompanied by: caretaker   PERTINENT HISTORY: CP 07/09/23-- Kerri Robertson is a 23 year old female who presents for a new physical therapy referral and recent fever with allergy symptoms. She is  seeking a new referral for physical therapy, which is required periodically for insurance purposes. She has been attending the same physical therapy location and typically sees a therapist named Bambi Lever, although she has seen different therapists at times. Her physical therapy primarily focuses on strength and balance exercises related to her cerebral palsy.    PAIN:  Are you having pain? No 0/10  PRECAUTIONS: Fall  RED FLAGS: None   WEIGHT BEARING RESTRICTIONS: No  FALLS: Has patient fallen in last 6 months? Yes. Number of falls 4-5  LIVING ENVIRONMENT: Lives with: lives with their family Lives in: House/apartment Stairs: No  PLOF: Independent with basic ADLs  PATIENT GOALS: to be able to get up from the floor, have less falls, work on balance and walking better  OBJECTIVE:  Note: Objective measures were completed at Evaluation unless otherwise noted.  DIAGNOSTIC FINDINGS: IMPRESSION: 1. The acetabulum is relatively shallow and the left femoral head demonstrates an unusual contour. These findings may be developmental secondary to the patient's cerebral palsy. 2. No other abnormalities.  COGNITION: Overall cognitive status: Within functional limits for tasks assessed   SENSATION: WFL  COORDINATION: Decrease coordination d/t CP  MUSCLE TONE: LLE: Mild and Moderate  MUSCLE LENGTH: Hamstrings and hip flexors are tight on both sides.   POSTURE: rounded shoulders,  left pelvic obliquity, and weight shift right  LOWER EXTREMITY ROM:  Trunk lateraly flexed due to scoliosis, L hip unable to achieve neutral ER, abd limited, B hamstrings tight, L ankle does not achieve neutral DF or inversion. Her L foot in turned inwards more, increased IR of hip. LUE with limited ROM at all joints, in all planes. She wears a splint on her wrist at night some nights.   LOWER EXTREMITY MMT:  MMT accuracy is questionable due to tone   MMT Right Eval Left Eval  Hip flexion 4 3+  Hip extension    Hip abduction 4 3+  Hip adduction 4 4-  Hip internal rotation    Hip external rotation    Knee flexion 4 3  Knee extension 4 4  Ankle dorsiflexion    Ankle plantarflexion    Ankle inversion    Ankle eversion    (Blank rows = not tested)  TRANSFERS: Assistive device utilized: None  Sit to stand: Modified independence Stand to sit: Modified independence Chair to chair: Modified independence Floor: Max A  CURB:  Level of Assistance: Modified independence Assistive device utilized: None  STAIRS: Level of Assistance: Modified independence Stair Negotiation Technique: Step to Pattern Alternating Pattern  with Single Rail on Right  GAIT: Gait pattern: step to pattern, decreased arm swing- Left, decreased step length- Left, decreased stance time- Left, decreased stride length, ataxic, trunk rotated posterior- Left, narrow BOS, and poor foot clearance- Left Distance walked: clinic distances Assistive device utilized: None Level of assistance: Modified independence  FUNCTIONAL TESTS:  5 times sit to stand: 14.63s Timed up and go (TUG): 13.06s                                                                                TREATMENT DATE:  09/10/23 NuStep L5x74mins  Supine hip ext against band 2x10 Butterfly stretch 30s  HS stretch 30s x2 each side  IT band stretch  Supine abduction and heel slides holding L foot  in neutral position Leg press 20# 2x10- using ball in  between legs Resisted gait 10# forwards x5 Side steps in front on mat table   09/08/23 Nustep level 5 x 7 minutes Step over step 6" and 4" On rockerboard two ways reaching On bosu balance Gait outside uneven terrain Passive stretch left LE hip, knee and calf Feet on ball K2C, trunk rotation, bridges, isometric abs Sliding board hip abduction  09/03/23 NuStep L5x46mins 5xSTS goal- 11.19s Step over 4" by stairs  Calf stretch 30s  Calf raises 2x10 on floor (unable to keep L foot on bar)  On airex straight arm pull minA  Feet on ball rotation, bridge, knees to chest x10 AB roll up with ball, small crunches 2x10 PROM/stretch of the left LE- HS, butterfly, IR/ER, ITB  Supine sliding board left hip abduction SLR 2x10    09/01/23 Nustep level 5 x 5 minutes Gait outside around the parking [place] then through the grass and uneven surfaces, SBA on asphalt some CGA on grass, one rest break In Pbars on airex voleey ball, bosu balance 5# HS curls 2x10 5# LAQ cues for TKE verbal tactile and visual 20# LEg press both feet with ball b/n knees, then with PT holding left knee in good position 2x10 with 20# Feet on ball rotation, bridge, isometric abs PROM/stretch of the left LE Supine sliding board left hip abduction  08/13/23 NuStep L5x31mins Box taps 6" 5# Leg ext LLE 2x10 HS curls 5# LLE 2x10 Step ups 4" forward and laterally with 1 HRA STS on airex 2x10 minA  Volleyball hits and kicks  Leg press LLE 20# 2x10 ball in between legs to prevent excessive adduction    08/11/23 Leg press 40# 2x10 both legs, 20# 2x10 with left only PT manually holding left knee and hip in a good position Feet on ball K2c, bridge, rotation Sit ups 2x10 Passive stretch of the left LE Nustep LE only level 5 x 6 minutes Side stepping, backward walking direction changes Volley ball on and off airex 6" toe touches on and off airex On airex reaching Stairs step over step with handrail  08/06/23 Alternating toe  taps BOSU + stepping over half foam rolls + forward and backwards figure 8s x2 rounds  Lateral lunges onto BOSU + sideways/diagonal steps over foam rollers + weaving between cones + backwards steps onto 4 inch box with U UE support (2 rounds) Forward step ups x10 + forward/side steps over full and half foam rolls + toe taps on air targets  Nustep L6x8 minutes BLEs only      08/04/23 Walk outdoors around [employer]  Leg press 20# 2x10 with ball in between legs to prevent valgus stress  Rocker board balance On BOSU balance and then reaching for targets NuStep L5x28mins LE only  Partial sit up 2x10 HS stretch 30s x2  Bridges with band 2x10 Volleyball hits    07/28/23 NuStep L5x7mins Bike L3 x24mins  Leg ext 5# 2x10 HS curls 15# 2x10 Fitter pushes 2 blue bands 2x10  Ball squeeze 2x10 Side steps on airex in pbars Standing on airex reaching for different color spike balls  Standing on airex reaching for numbers HS stretching 30s x2   07/16/23 Nustep level 5 x 6 minutes Step turn reach a lot of cues needed On airex reaching for numbers Standing on Bosu upside down balance CGA On  rocker board balance CGA LEg press 20# 2x10 ball b/n knees, then left only iwht manual assist for knee alignement Standing volley ball ball kicks Leg curls 15# 2x10 Leg extension 5# 2x10 cues for TKE On airex right arm row and extension Volley ball  Ball kicks Partial sit ups Gait outside int he grass  07/14/23- EVAL    PATIENT EDUCATION: Education details: POC, reviewed HEP with her and Bernett Brill her caretaker today Person educated: Patient and Caregiver Bernett Brill Education method: Explanation Education comprehension: verbalized understanding  HOME EXERCISE PROGRAM: Access Code: [email or web address] URL: [email or web address] Date: 08/04/2023 Prepared by: Donavon Fudge  Exercises - Supine Hip Internal and External Rotation  - 1 x daily - 7 x weekly - 3 sets - 10 reps - Supine Hip Abduction  on Slider  - 1 x daily - 7 x weekly - 3 sets - 10 reps - Hooklying Clamshell with Resistance  - 1 x daily - 7 x weekly - 3 sets - 10 reps - Hip Abduction and Adduction Caregiver PROM  - 1 x daily - 7 x weekly - 3 sets - 10 reps - Hip Internal and External Rotation Caregiver PROM  - 1 x daily - 7 x weekly - 3 sets - 10 reps - Supine Short Arc Quad  - 1 x daily - 7 x weekly - 3 sets - 10 reps - Supine Bridge  - 1 x daily - 7 x weekly - 3 sets - 10 reps   GOALS: Goals reviewed with patient? Yes  SHORT TERM GOALS: Target date: 08/25/23  Patient will be independent with initial HEP. Baseline: has an old HEP but not doing Goal status:met 09/01/23  2.  Patient will demonstrate decreased fall risk by scoring < 12 sec on 5xSTS. Baseline: 14.63s Goal status: MET 11.19s 09/03/23   LONG TERM GOALS: Target date: 10/06/23  Patient will be independent with advanced/ongoing HEP to improve outcomes and carryover.  Baseline:  Goal status: progressing 09/08/23  2.  Patient will be able to complete floor transfer with minA   Baseline: mod-maxA needed when she falls Goal status: ongoing 09/01/23  3.  Patient will report no falls in a 4 week period   Baseline: "fell last night" 07/14/23 Goal status: progressing 09/08/23  4.  Patient will demonstrate improved functional LE strength as demonstrated by 4/5 in LLE. Baseline: see chart Goal status: INITIAL  5.  Patient will ambulate with more normalized gait pattern and less internal rotation of LLE  Baseline: caretakers reports her LLE in turning in more than usual  Goal status: INITIAL   ASSESSMENT:  CLINICAL IMPRESSION: I continue to work on balance, strength, gait and flexibility. Her LLE is very internally rotated today, she reports it was not this bad last visit.  I had to hold her leg in a neutral position to do some of the exercises like the slides and on the leg press. Continued to work on LE stretches and she is still very tight.   Patient is a  23 y.o. female who was seen today for physical therapy evaluation and treatment for CP. She is a returning patient who has no real changes but has had an increase in numbers of falls and has noticed that her LLE turns inwards more than it used to. Her LLE is weak but it is hard to tell how accurate it is due to her tone. Patient will benefit from ongoing strength training and balance to decrease her risk for falls. She  will also benefit from stretching to increase her flexibility, especially in her hips.   OBJECTIVE IMPAIRMENTS: Abnormal gait, decreased balance, decreased coordination, decreased endurance, decreased ROM, decreased strength, impaired tone, impaired UE functional use, and postural dysfunction.   ACTIVITY LIMITATIONS: squatting, stairs, transfers, and locomotion level  PERSONAL FACTORS: Age, Behavior pattern, Past/current experiences, and Time since onset of injury/illness/exacerbation are also affecting patient's functional outcome.   REHAB POTENTIAL: Fair chronicity of symptoms  CLINICAL DECISION MAKING: Stable/uncomplicated  EVALUATION COMPLEXITY: Low  PLAN:  PT FREQUENCY: 2x/week  PT DURATION: 12 weeks  PLANNED INTERVENTIONS: 97110-Therapeutic exercises, 97530- Therapeutic activity, 97112- Neuromuscular re-education, 97535- Self Care, 13086- Manual therapy, [phone]- Gait training, Patient/Family education, Balance training, Stair training, Taping, Dry Needling, Joint mobilization, Spinal mobilization, Cryotherapy, and Moist heat  PLAN FOR NEXT SESSION: LE stretching, strengthening hip abd, balance, functional strengthening, work on curbs  10th visit progress note   Cherylene Corrente, PT 09/10/23 1:13 PM

## 2023-09-10 ENCOUNTER — Ambulatory Visit

## 2023-09-10 DIAGNOSIS — I69954 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: Secondary | ICD-10-CM | POA: Diagnosis not present

## 2023-09-10 DIAGNOSIS — R293 Abnormal posture: Secondary | ICD-10-CM

## 2023-09-10 DIAGNOSIS — G800 Spastic quadriplegic cerebral palsy: Secondary | ICD-10-CM

## 2023-09-10 DIAGNOSIS — M6281 Muscle weakness (generalized): Secondary | ICD-10-CM

## 2023-09-10 DIAGNOSIS — G801 Spastic diplegic cerebral palsy: Secondary | ICD-10-CM

## 2023-09-10 DIAGNOSIS — R2681 Unsteadiness on feet: Secondary | ICD-10-CM

## 2023-09-15 ENCOUNTER — Ambulatory Visit: Admitting: Physical Therapy

## 2023-09-15 ENCOUNTER — Encounter: Payer: Self-pay | Admitting: Physical Therapy

## 2023-09-15 DIAGNOSIS — R293 Abnormal posture: Secondary | ICD-10-CM

## 2023-09-15 DIAGNOSIS — G801 Spastic diplegic cerebral palsy: Secondary | ICD-10-CM

## 2023-09-15 DIAGNOSIS — G800 Spastic quadriplegic cerebral palsy: Secondary | ICD-10-CM

## 2023-09-15 DIAGNOSIS — M6281 Muscle weakness (generalized): Secondary | ICD-10-CM

## 2023-09-15 DIAGNOSIS — R262 Difficulty in walking, not elsewhere classified: Secondary | ICD-10-CM

## 2023-09-15 DIAGNOSIS — R531 Weakness: Secondary | ICD-10-CM

## 2023-09-15 DIAGNOSIS — I69954 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: Secondary | ICD-10-CM | POA: Diagnosis not present

## 2023-09-15 DIAGNOSIS — R2681 Unsteadiness on feet: Secondary | ICD-10-CM

## 2023-09-15 NOTE — Therapy (Signed)
 OUTPATIENT PHYSICAL THERAPY NEURO TREATMENT   Patient Name: Kerri Robertson MRN: [id number] DOB:Jun 19, 2000, 23 y.o., female Today's Date: 09/15/2023   PCP: Minna Amass REFERRING PROVIDER: Minna Amass  END OF SESSION:  PT End of Session - 09/15/23 1440     Visit Number 12    Date for PT Re-Evaluation 10/06/23    Authorization Type [employer]    PT Start Time 1440    PT Stop Time 1527    PT Time Calculation (min) 47 min    Activity Tolerance Patient tolerated treatment well    Behavior During Therapy WFL for tasks assessed/performed                   Past Medical History:  Diagnosis Date   Allergy    Cerebral palsy ([name])    CP (cerebral palsy) ([name])    Stroke [facility])    Past Surgical History:  Procedure Laterality Date   ARM SURGERY     2013   FOOT SURGERY     2010   HAMSTRING LENGTHENING     TIBIA / FIBIA LENGTHENING     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   Balance problem 05/06/2018   Syncope 05/06/2018   History of cerebrovascular accident 11/18/2017   Anxiety state 06/25/2017   Spasticity 06/25/2017   Allergic rhinitis 10/21/2016   Mild intermittent asthma 10/21/2016    ONSET DATE: 07/09/23- new referral  REFERRING DIAG:  G80.0 (ICD-10-CM) - CP (cerebral palsy), spastic, 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[name])      THERAPY DIAG:  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)  Spastic quadriplegic cerebral palsy ([name])  Abnormal posture  Unsteadiness on feet  Muscle weakness (generalized)  Cerebral palsy with spastic diplegia  ([name])  Left-sided weakness  Difficulty in walking, not elsewhere classified  Rationale for Evaluation and Treatment: Rehabilitation  SUBJECTIVE:                                                                                                                                                                                             SUBJECTIVE STATEMENT:  No falls, did the HEP on Med[employer]  Pt accompanied by: caretaker   PERTINENT HISTORY: CP 07/09/23-- Kerri Robertson is a 23 year old female who presents for a  new physical therapy referral and recent fever with allergy symptoms. She is seeking a new referral for physical therapy, which is required periodically for insurance purposes. She has been attending the same physical therapy location and typically sees a therapist named Bambi Lever, although she has seen different therapists at times. Her physical therapy primarily focuses on strength and balance exercises related to her cerebral palsy.    PAIN:  Are you having pain? No 0/10  PRECAUTIONS: Fall  RED FLAGS: None   WEIGHT BEARING RESTRICTIONS: No  FALLS: Has patient fallen in last 6 months? Yes. Number of falls 4-5  LIVING ENVIRONMENT: Lives with: lives with their family Lives in: House/apartment Stairs: No  PLOF: Independent with basic ADLs  PATIENT GOALS: to be able to get up from the floor, have less falls, work on balance and walking better  OBJECTIVE:  Note: Objective measures were completed at Evaluation unless otherwise noted.  DIAGNOSTIC FINDINGS: IMPRESSION: 1. The acetabulum is relatively shallow and the left femoral head demonstrates an unusual contour. These findings may be developmental secondary to the patient's cerebral palsy. 2. No other abnormalities.  COGNITION: Overall cognitive status: Within functional limits for tasks assessed   SENSATION: WFL  COORDINATION: Decrease coordination d/t CP  MUSCLE TONE: LLE: Mild and Moderate  MUSCLE  LENGTH: Hamstrings and hip flexors are tight on both sides.   POSTURE: rounded shoulders, left pelvic obliquity, and weight shift right  LOWER EXTREMITY ROM:  Trunk lateraly flexed due to scoliosis, L hip unable to achieve neutral ER, abd limited, B hamstrings tight, L ankle does not achieve neutral DF or inversion. Her L foot in turned inwards more, increased IR of hip. LUE with limited ROM at all joints, in all planes. She wears a splint on her wrist at night some nights.   LOWER EXTREMITY MMT:  MMT accuracy is questionable due to tone   MMT Right Eval Left Eval  Hip flexion 4 3+  Hip extension    Hip abduction 4 3+  Hip adduction 4 4-  Hip internal rotation    Hip external rotation    Knee flexion 4 3  Knee extension 4 4  Ankle dorsiflexion    Ankle plantarflexion    Ankle inversion    Ankle eversion    (Blank rows = not tested)  TRANSFERS: Assistive device utilized: None  Sit to stand: Modified independence Stand to sit: Modified independence Chair to chair: Modified independence Floor: Max A  CURB:  Level of Assistance: Modified independence Assistive device utilized: None  STAIRS: Level of Assistance: Modified independence Stair Negotiation Technique: Step to Pattern Alternating Pattern  with Single Rail on Right  GAIT: Gait pattern: step to pattern, decreased arm swing- Left, decreased step length- Left, decreased stance time- Left, decreased stride length, ataxic, trunk rotated posterior- Left, narrow BOS, and poor foot clearance- Left Distance walked: clinic distances Assistive device utilized: None Level of assistance: Modified independence  FUNCTIONAL TESTS:  5 times sit to stand: 14.63s Timed up and go (TUG): 13.06s                                                                                TREATMENT DATE:  09/15/23  Nustep level 5 x 6 minutes LE only On Bosu balance and reaching On ariex reaching and ball hits Partial sit ups Left hip abduction  supine Leg press 20# 2x10, left only with PT helping knee position On airex ball volley Feet on ball K2C, rotation, bridge, isometric abs Passive stretch of the left LE Side stepping backward walking direction changes  09/10/23 NuStep L5x79mins  Supine hip ext against band 2x10 Butterfly stretch 30s  HS stretch 30s x2 each side  IT band stretch  Supine abduction and heel slides holding L foot  in neutral position Leg press 20# 2x10- using ball in between legs Resisted gait 10# forwards x5 Side steps in front on mat table   09/08/23 Nustep level 5 x 7 minutes Step over step 6" and 4" On rockerboard two ways reaching On bosu balance Gait outside uneven terrain Passive stretch left LE hip, knee and calf Feet on ball K2C, trunk rotation, bridges, isometric abs Sliding board hip abduction  09/03/23 NuStep L5x69mins 5xSTS goal- 11.19s Step over 4" by stairs  Calf stretch 30s  Calf raises 2x10 on floor (unable to keep L foot on bar)  On airex straight arm pull minA  Feet on ball rotation, bridge, knees to chest x10 AB roll up with ball, small crunches 2x10 PROM/stretch of the left LE- HS, butterfly, IR/ER, ITB  Supine sliding board left hip abduction SLR 2x10    09/01/23 Nustep level 5 x 5 minutes Gait outside around the parking [place] then through the grass and uneven surfaces, SBA on asphalt some CGA on grass, one rest break In Pbars on airex voleey ball, bosu balance 5# HS curls 2x10 5# LAQ cues for TKE verbal tactile and visual 20# LEg press both feet with ball b/n knees, then with PT holding left knee in good position 2x10 with 20# Feet on ball rotation, bridge, isometric abs PROM/stretch of the left LE Supine sliding board left hip abduction  08/13/23 NuStep L5x71mins Box taps 6" 5# Leg ext LLE 2x10 HS curls 5# LLE 2x10 Step ups 4" forward and laterally with 1 HRA STS on airex 2x10 minA  Volleyball hits and kicks  Leg press LLE 20# 2x10 ball in between legs to prevent  excessive adduction    08/11/23 Leg press 40# 2x10 both legs, 20# 2x10 with left only PT manually holding left knee and hip in a good position Feet on ball K2c, bridge, rotation Sit ups 2x10 Passive stretch of the left LE Nustep LE only level 5 x 6 minutes Side stepping, backward walking direction changes Volley ball on and off airex 6" toe touches on and off airex On airex reaching Stairs step over step with handrail  08/06/23 Alternating toe taps BOSU + stepping over half foam rolls + forward and backwards figure 8s x2 rounds  Lateral lunges onto BOSU + sideways/diagonal steps over foam rollers + weaving between cones + backwards steps onto 4 inch box with U UE support (2 rounds) Forward step ups x10 + forward/side steps over full and half foam rolls + toe taps on air targets  Nustep L6x8 minutes BLEs only      08/04/23 Walk outdoors around [employer]  Leg press 20# 2x10 with ball in between legs to prevent valgus stress  Rocker board balance On BOSU balance and then reaching for targets NuStep L5x12mins LE only  Partial sit up 2x10 HS stretch 30s x2  Bridges with band 2x10 Volleyball hits    07/28/23 NuStep L5x49mins Bike L3  x19mins  Leg ext 5# 2x10 HS curls 15# 2x10 Fitter pushes 2 blue bands 2x10  Ball squeeze 2x10 Side steps on airex in pbars Standing on airex reaching for different color spike balls  Standing on airex reaching for numbers HS stretching 30s x2   07/16/23 Nustep level 5 x 6 minutes Step turn reach a lot of cues needed On airex reaching for numbers Standing on Bosu upside down balance CGA On rocker board balance CGA LEg press 20# 2x10 ball b/n knees, then left only iwht manual assist for knee alignement Standing volley ball ball kicks Leg curls 15# 2x10 Leg extension 5# 2x10 cues for TKE On airex right arm row and extension Volley ball  Ball kicks Partial sit ups Gait outside int he grass  07/14/23- EVAL    PATIENT  EDUCATION: Education details: POC, reviewed HEP with her and Bernett Brill her caretaker today Person educated: Patient and Caregiver Bernett Brill Education method: Explanation Education comprehension: verbalized understanding  HOME EXERCISE PROGRAM: Access Code: [email or web address] URL: [email or web address] Date: 08/04/2023 Prepared by: Donavon Fudge  Exercises - Supine Hip Internal and External Rotation  - 1 x daily - 7 x weekly - 3 sets - 10 reps - Supine Hip Abduction on Slider  - 1 x daily - 7 x weekly - 3 sets - 10 reps - Hooklying Clamshell with Resistance  - 1 x daily - 7 x weekly - 3 sets - 10 reps - Hip Abduction and Adduction Caregiver PROM  - 1 x daily - 7 x weekly - 3 sets - 10 reps - Hip Internal and External Rotation Caregiver PROM  - 1 x daily - 7 x weekly - 3 sets - 10 reps - Supine Short Arc Quad  - 1 x daily - 7 x weekly - 3 sets - 10 reps - Supine Bridge  - 1 x daily - 7 x weekly - 3 sets - 10 reps   GOALS: Goals reviewed with patient? Yes  SHORT TERM GOALS: Target date: 08/25/23  Patient will be independent with initial HEP. Baseline: has an old HEP but not doing Goal status:met 09/01/23  2.  Patient will demonstrate decreased fall risk by scoring < 12 sec on 5xSTS. Baseline: 14.63s Goal status: MET 11.19s 09/03/23   LONG TERM GOALS: Target date: 10/06/23  Patient will be independent with advanced/ongoing HEP to improve outcomes and carryover.  Baseline:  Goal status: progressing 09/08/23  2.  Patient will be able to complete floor transfer with minA   Baseline: mod-maxA needed when she falls Goal status: ongoing 09/01/23  3.  Patient will report no falls in a 4 week period   Baseline: "fell last night" 07/14/23 Goal status: progressing 09/08/23  4.  Patient will demonstrate improved functional LE strength as demonstrated by 4/5 in LLE. Baseline: see chart Goal status: progressing 09/15/23  5.  Patient will ambulate with more normalized gait pattern and less  internal rotation of LLE  Baseline: caretakers reports her LLE in turning in more than usual  Goal status: ongoing 09/15/23   ASSESSMENT:  CLINICAL IMPRESSION: I continue to work on balance, strength, gait and flexibility. She tolerates the stretching very well, has not had any c/o pain after the stretching.  After stretching the caregiver and her both report  better walking and motions with less struggling.  She is demonstrating much improved hip abduction  Patient is a 23 y.o. female who was seen today for physical therapy evaluation and treatment for CP. She is  a returning patient who has no real changes but has had an increase in numbers of falls and has noticed that her LLE turns inwards more than it used to. Her LLE is weak but it is hard to tell how accurate it is due to her tone. Patient will benefit from ongoing strength training and balance to decrease her risk for falls. She will also benefit from stretching to increase her flexibility, especially in her hips.   OBJECTIVE IMPAIRMENTS: Abnormal gait, decreased balance, decreased coordination, decreased endurance, decreased ROM, decreased strength, impaired tone, impaired UE functional use, and postural dysfunction.   ACTIVITY LIMITATIONS: squatting, stairs, transfers, and locomotion level  PERSONAL FACTORS: Age, Behavior pattern, Past/current experiences, and Time since onset of injury/illness/exacerbation are also affecting patient's functional outcome.   REHAB POTENTIAL: Fair chronicity of symptoms  CLINICAL DECISION MAKING: Stable/uncomplicated  EVALUATION COMPLEXITY: Low  PLAN:  PT FREQUENCY: 2x/week  PT DURATION: 12 weeks  PLANNED INTERVENTIONS: 97110-Therapeutic exercises, 97530- Therapeutic activity, 97112- Neuromuscular re-education, 97535- Self Care, [id number]- Manual therapy, [phone]- Gait training, Patient/Family education, Balance training, Stair training, Taping, Dry Needling, Joint mobilization, Spinal mobilization,  Cryotherapy, and Moist heat  PLAN FOR NEXT SESSION: LE stretching, strengthening hip abd, balance, functional strengthening, work on curbs  10th visit progress note   Cherylene Corrente, PT 09/15/23 2:45 PM

## 2023-09-17 ENCOUNTER — Encounter: Payer: Self-pay | Admitting: Physical Therapy

## 2023-09-17 ENCOUNTER — Ambulatory Visit: Admitting: Physical Therapy

## 2023-09-17 DIAGNOSIS — R2681 Unsteadiness on feet: Secondary | ICD-10-CM

## 2023-09-17 DIAGNOSIS — G800 Spastic quadriplegic cerebral palsy: Secondary | ICD-10-CM

## 2023-09-17 DIAGNOSIS — I69954 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: Secondary | ICD-10-CM

## 2023-09-17 DIAGNOSIS — R293 Abnormal posture: Secondary | ICD-10-CM

## 2023-09-17 DIAGNOSIS — M6281 Muscle weakness (generalized): Secondary | ICD-10-CM

## 2023-09-17 NOTE — Therapy (Signed)
 OUTPATIENT PHYSICAL THERAPY NEURO TREATMENT/PROGRESS NOTE    Patient Name: Steven Placeres MRN: [id number] DOB:[birth year], 23 y.o., female Today's Date: 09/17/2023   Progress Note Reporting Period 07/14/23 to 09/17/23  See note below for Objective Data and Assessment of Progress/Goals.       PCP: Minna Amass REFERRING PROVIDER: Minna Amass  END OF SESSION:  PT End of Session - 09/17/23 1405     Visit Number 13    Date for PT Re-Evaluation 10/06/23    Authorization Type [employer]    PT Start Time 1348    PT Stop Time 1427    PT Time Calculation (min) 39 min    Activity Tolerance Patient tolerated treatment well    Behavior During Therapy WFL for tasks assessed/performed                    Past Medical History:  Diagnosis Date   Allergy    Cerebral palsy ([name])    CP (cerebral palsy) ([name])    Stroke [facility])    Past Surgical History:  Procedure Laterality Date   ARM SURGERY     2013   FOOT SURGERY     2010   HAMSTRING LENGTHENING     TIBIA / FIBIA LENGTHENING     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   Balance problem 05/06/2018   Syncope 05/06/2018   History of cerebrovascular accident 11/18/2017   Anxiety state 06/25/2017   Spasticity 06/25/2017   Allergic rhinitis 10/21/2016   Mild intermittent asthma 10/21/2016    ONSET DATE: 07/09/23- new referral  REFERRING DIAG:  G80.0 (ICD-10-CM) - CP (cerebral palsy), spastic, 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[name])      THERAPY DIAG:  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 Balance and stretching are concerns too. No falls since last time  that I remember.   Pt accompanied by: caretaker   PERTINENT HISTORY: CP 07/09/23-- Aeralyn Behanna is a 23 year old female who presents for a new physical therapy referral and recent fever with allergy symptoms. She is seeking a new referral for physical therapy, which is required periodically for insurance purposes. She has been attending the same physical therapy location and typically sees a therapist named Bambi Lever, although she has seen different therapists at times. Her physical therapy primarily focuses on strength and balance exercises related to her cerebral palsy.    PAIN:  Are you having pain? No 0/10 right now   PRECAUTIONS: Fall  RED FLAGS: None   WEIGHT BEARING RESTRICTIONS: No  FALLS: Has patient fallen in last 6 months? Yes. Number of falls 4-5  LIVING ENVIRONMENT: Lives with: lives with their family Lives in: House/apartment Stairs: No  PLOF: Independent with basic ADLs  PATIENT GOALS: to be able to get up from the floor, have less falls, work on balance and walking better  OBJECTIVE:  Note: Objective measures were completed at Evaluation unless otherwise noted.  DIAGNOSTIC FINDINGS: IMPRESSION: 1. The acetabulum is relatively shallow and the left femoral head demonstrates an unusual contour. These findings may be developmental secondary to the patient's cerebral palsy. 2. No other abnormalities.  COGNITION: Overall cognitive status: Within functional limits for tasks  assessed   SENSATION: WFL  COORDINATION: Decrease coordination d/t CP  MUSCLE TONE: LLE: Mild and Moderate  MUSCLE LENGTH: Hamstrings and hip flexors are tight on both sides.   POSTURE: rounded shoulders, left pelvic obliquity, and weight shift right  LOWER EXTREMITY ROM:  Trunk lateraly flexed due to scoliosis, L hip unable to achieve neutral ER, abd limited, B hamstrings tight, L ankle does not achieve neutral DF or inversion. Her L foot in turned inwards more, increased IR of hip. LUE with limited ROM at all joints, in all planes. She wears a splint on her wrist at night some nights.   LOWER EXTREMITY MMT:  MMT accuracy is questionable due to tone   MMT Right Eval Left Eval Right 09/17/23 Left 09/17/23  Hip flexion 4 3+ 3 3  Hip extension      Hip abduction 4 3+ 3 sidelying  3 sidelying   Hip adduction 4 4-    Hip internal rotation      Hip external rotation      Knee flexion 4 3 4- 4-  Knee extension 4 4 4+ 4+  Ankle dorsiflexion      Ankle plantarflexion      Ankle inversion      Ankle eversion      (Blank rows = not tested)  TRANSFERS: Assistive device utilized: None  Sit to stand: Modified independence Stand to sit: Modified independence Chair to chair: Modified independence Floor: Max A  CURB:  Level of Assistance: Modified independence Assistive device utilized: None  STAIRS: Level of Assistance: Modified independence Stair Negotiation Technique: Step to Pattern Alternating Pattern  with Single Rail on Right  GAIT: Gait pattern: step to pattern, decreased arm swing- Left, decreased step length- Left, decreased stance time- Left, decreased stride length, ataxic, trunk rotated posterior- Left, narrow BOS, and poor foot clearance- Left Distance walked: clinic distances Assistive device utilized: None Level of assistance: Modified independence  FUNCTIONAL TESTS:  5 times sit to stand: 14.63s Timed up and go (TUG): 13.06s  09/17/23- floor to stand  TREATMENT DATE:   09/17/23  MMT, assessed floor to stand transfers for progress note, education on POC moving forward and findings   Nustep L5x8 minutes BLEs only  3 way taps on blue foam pad light UUE support x10 B  Forward lunges onto BOSU with ipsilateral and cross midline reaches x7-8 B Reaches while on BOSU up to 4 consecutive touches  Step ups on 6 inch box on top of blue foam pad x6-7 B       09/15/23 Nustep level 5 x 6 minutes LE only On Bosu balance and reaching On ariex reaching and ball hits Partial sit ups Left hip abduction supine Leg press 20# 2x10, left only with PT helping knee position On airex ball volley Feet on ball K2C, rotation, bridge, isometric abs Passive stretch of the left LE Side stepping backward walking direction changes  09/10/23 NuStep L5x90mins  Supine hip ext against band 2x10 Butterfly stretch 30s  HS stretch 30s x2 each side  IT band stretch  Supine abduction and heel slides holding L foot  in neutral position Leg press 20# 2x10- using ball in between legs Resisted gait 10# forwards x5 Side steps in front on mat table   09/08/23 Nustep level 5 x 7 minutes Step over step 6" and 4" On rockerboard two ways reaching On bosu balance Gait outside uneven terrain Passive stretch left LE hip, knee and calf Feet on ball K2C, trunk rotation, bridges, isometric abs Sliding board hip abduction  09/03/23 NuStep L5x56mins 5xSTS goal- 11.19s Step over 4" by stairs  Calf stretch 30s  Calf raises 2x10 on floor (unable to keep L foot on bar)  On airex straight arm pull minA  Feet on ball rotation, bridge, knees to chest x10 AB roll up with ball, small crunches 2x10 PROM/stretch of the left LE- HS, butterfly, IR/ER, ITB  Supine sliding board left hip abduction SLR 2x10       PATIENT EDUCATION: Education details: POC, reviewed HEP with her and Bernett Brill her caretaker  today Person educated: Patient and Caregiver Bernett Brill Education method: Explanation Education comprehension: verbalized understanding  HOME EXERCISE PROGRAM: Access Code: [email or web address] URL: [email or web address] Date: 08/04/2023 Prepared by: Donavon Fudge  Exercises - Supine Hip Internal and External Rotation  - 1 x daily - 7 x weekly - 3 sets - 10 reps - Supine Hip Abduction on Slider  - 1 x daily - 7 x weekly - 3 sets - 10 reps - Hooklying Clamshell with Resistance  - 1 x daily - 7 x weekly - 3 sets - 10 reps - Hip Abduction and Adduction Caregiver PROM  - 1 x daily - 7 x weekly - 3 sets - 10 reps - Hip Internal and External Rotation Caregiver PROM  - 1 x daily - 7 x weekly - 3 sets - 10 reps - Supine Short Arc Quad  - 1 x daily - 7 x weekly - 3 sets - 10 reps - Supine Bridge  - 1 x daily - 7 x weekly - 3 sets - 10 reps   GOALS: Goals reviewed with patient? Yes  SHORT TERM GOALS: Target date: 08/25/23  Patient will be independent with initial HEP. Baseline: has an old HEP but not doing Goal status:met 09/01/23  2.  Patient will demonstrate decreased fall risk by scoring < 12 sec on 5xSTS. Baseline: 14.63s Goal status: MET 11.19s 09/03/23   LONG TERM GOALS: Target date: 10/06/23  Patient will be independent with advanced/ongoing HEP to improve  outcomes and carryover.  Baseline:  Goal status: progressing 09/08/23  2.  Patient will be able to complete floor transfer with minA   Baseline: mod-maxA needed when she falls Goal status: MET 09/17/23 Mod(I) when able to press up from chair or bed   3.  Patient will report no falls in a 4 week period   Baseline: "fell last night" 07/14/23 Goal status: progressing 09/08/23  4.  Patient will demonstrate improved functional LE strength as demonstrated by 4/5 in LLE. Baseline: see chart Goal status: progressing 09/17/23  5.  Patient will ambulate with more normalized gait pattern and less internal rotation of LLE  Baseline:  caretakers reports her LLE in turning in more than usual  Goal status: ongoing 09/15/23   ASSESSMENT:  CLINICAL IMPRESSION:   Pt arrives today doing well, seems to be making decent progress with skilled PT services. Still fairly concerned with her balance and working on her stretching, also with curb navigation- these can sometimes be difficult for her depending on the day. Will continue to challenge as appropriate.     Patient is a 23 y.o. female who was seen today for physical therapy evaluation and treatment for CP. She is a returning patient who has no real changes but has had an increase in numbers of falls and has noticed that her LLE turns inwards more than it used to. Her LLE is weak but it is hard to tell how accurate it is due to her tone. Patient will benefit from ongoing strength training and balance to decrease her risk for falls. She will also benefit from stretching to increase her flexibility, especially in her hips.   OBJECTIVE IMPAIRMENTS: Abnormal gait, decreased balance, decreased coordination, decreased endurance, decreased ROM, decreased strength, impaired tone, impaired UE functional use, and postural dysfunction.   ACTIVITY LIMITATIONS: squatting, stairs, transfers, and locomotion level  PERSONAL FACTORS: Age, Behavior pattern, Past/current experiences, and Time since onset of injury/illness/exacerbation are also affecting patient's functional outcome.   REHAB POTENTIAL: Fair chronicity of symptoms  CLINICAL DECISION MAKING: Stable/uncomplicated  EVALUATION COMPLEXITY: Low  PLAN:  PT FREQUENCY: 2x/week  PT DURATION: 12 weeks  PLANNED INTERVENTIONS: 97110-Therapeutic exercises, 97530- Therapeutic activity, 97112- Neuromuscular re-education, 97535- Self Care, 13244- Manual therapy, [id number]- Gait training, Patient/Family education, Balance training, Stair training, Taping, Dry Needling, Joint mobilization, Spinal mobilization, Cryotherapy, and Moist heat  PLAN FOR  NEXT SESSION: LE stretching, strengthening hip abd, balance, functional strengthening, work on Medical sales representative, PT, DPT 09/17/23 2:28 PM

## 2023-12-07 ENCOUNTER — Telehealth: Payer: Self-pay | Admitting: Family Medicine

## 2023-12-07 NOTE — Telephone Encounter (Signed)
 Pt dropped off papers to be filled out by pcp. Placed papers in pcps box. Please call pt when papers are ready to be picked up.

## 2023-12-08 NOTE — Telephone Encounter (Signed)
 LVM for patient to call back to let us  know how soon she needs forms as Waddell is out of the office. Awaiting call back.

## 2023-12-18 NOTE — Telephone Encounter (Signed)
 Forms completed. At the front for pick up. Patient made aware.

## 2023-12-21 ENCOUNTER — Encounter: Payer: Self-pay | Admitting: Family Medicine

## 2023-12-21 ENCOUNTER — Ambulatory Visit (INDEPENDENT_AMBULATORY_CARE_PROVIDER_SITE_OTHER): Admitting: Family Medicine

## 2023-12-21 VITALS — BP 118/60 | HR 73 | Ht 60.75 in | Wt 114.0 lb

## 2023-12-21 DIAGNOSIS — G800 Spastic quadriplegic cerebral palsy: Secondary | ICD-10-CM

## 2023-12-21 DIAGNOSIS — E611 Iron deficiency: Secondary | ICD-10-CM | POA: Diagnosis not present

## 2023-12-21 DIAGNOSIS — E559 Vitamin D deficiency, unspecified: Secondary | ICD-10-CM | POA: Diagnosis not present

## 2023-12-21 DIAGNOSIS — Z0000 Encounter for general adult medical examination without abnormal findings: Secondary | ICD-10-CM | POA: Diagnosis not present

## 2023-12-21 DIAGNOSIS — R2689 Other abnormalities of gait and mobility: Secondary | ICD-10-CM | POA: Diagnosis not present

## 2023-12-21 NOTE — Progress Notes (Signed)
 Complete physical exam  Patient: Kerri Robertson   DOB: Jul 01, 2000   23 y.o. Female  MRN: [id number]  Subjective:    Chief Complaint  Patient presents with   Annual Exam    Kerri Robertson is a 23 y.o. female who presents today for a complete physical exam. She reports consuming a general diet. The patient does not participate in regular exercise at present. She generally feels well. She reports sleeping well. She does not have additional problems to discuss today.   Currently lives with: mom and dad Acute concerns or interim problems since last visit: no  Vision concerns: no Dental concerns: no   ETOH use: no Nicotine use: no Recreational drugs/illegal substances: no    Females:  She is not currently  sexually active  Contraception choices are: n/a LMP: 12/21/23      Most recent fall risk assessment:    12/21/2023    1:26 PM  Fall Risk   Falls in the past year? 1  Number falls in past yr: 1  Injury with Fall? 0  Risk for fall due to : History of fall(s)  Follow up Falls evaluation completed     Most recent depression screenings:    12/21/2023    1:26 PM 03/17/2022    3:19 PM  PHQ 2/9 Scores  PHQ - 2 Score 0 0            Patient Care Team: Almarie Waddell NOVAK, NP as PCP - General (Family Medicine)   Outpatient Medications Prior to Visit  Medication Sig   albuterol  (PROVENTIL ) (2.5 MG/3ML) 0.083% nebulizer solution Take 3 mLs (2.5 mg total) by nebulization every 6 (six) hours as needed for wheezing or shortness of breath.   benzonatate  (TESSALON ) 200 MG capsule Take 1 capsule (200 mg total) by mouth 2 (two) times daily as needed for cough.   cetirizine  (ZYRTEC ) 10 MG tablet Take 1 tablet (10 mg total) by mouth daily.   Cholecalciferol (VITAMIN D -3) 25 MCG (1000 UT) CAPS Take 1 capsule by mouth daily.   FEROSUL 325 (65 Fe) MG tablet Take 1 tablet (325 mg total) by mouth every morning.   fluticasone  (FLONASE ) 50 MCG/ACT nasal spray Place 2 sprays into  both nostrils daily.   Midazolam  (NAYZILAM ) 5 MG/0.1ML SOLN Use 1 spray in 1 nostril as needed for seizure lasting longer than 2 minutes or for seizure cluster.  May repeat in 5 minutes if needed.   Multiple Vitamin (MULTIVITAMIN WITH MINERALS) TABS tablet Take 1 tablet by mouth daily.   ondansetron  (ZOFRAN ) 4 MG tablet Take 1 tablet (4 mg total) by mouth every 8 (eight) hours as needed for nausea or vomiting. (Patient taking differently: Take 4 mg by mouth as needed for nausea or vomiting.)   promethazine  (PHENERGAN ) 25 MG suppository Place 1 suppository (25 mg total) rectally every 6 (six) hours as needed for nausea or vomiting. Take if vomiting and cannot take oral zofran  or zofran  doesn't work   propranolol  (INDERAL ) 10 MG tablet TAKE 1/2 TABLET(5 MG) BY MOUTH THREE TIMES DAILY   Ubrogepant  (UBRELVY ) 100 MG TABS Take 1 tablet (100 mg total) by mouth every 2 (two) hours as needed. Maximum 200mg  a day.   No facility-administered medications prior to visit.    ROS All review of systems negative except what is listed in the HPI        Objective:     BP 118/60   Pulse 73   Ht 5' 0.75 (1.543 m)  Wt 114 lb (51.7 kg)   SpO2 99%   BMI 21.72 kg/m    Physical Exam Vitals reviewed.  Constitutional:      General: She is not in acute distress.    Appearance: Normal appearance. She is normal weight. She is not ill-appearing.  HENT:     Head: Normocephalic and atraumatic.     Right Ear: Tympanic membrane normal.     Left Ear: Tympanic membrane normal.     Nose: Nose normal.     Mouth/Throat:     Mouth: Mucous membranes are moist.     Pharynx: Oropharynx is clear. No oropharyngeal exudate.  Eyes:     Pupils: Pupils are equal, round, and reactive to light.  Cardiovascular:     Rate and Rhythm: Normal rate and regular rhythm.     Pulses: Normal pulses.     Heart sounds: Normal heart sounds.  Pulmonary:     Effort: Pulmonary effort is normal.     Breath sounds: Normal breath  sounds.  Abdominal:     General: Abdomen is flat. There is no distension.     Palpations: Abdomen is soft.     Tenderness: There is no abdominal tenderness. There is no right CVA tenderness or left CVA tenderness.  Musculoskeletal:     Cervical back: Normal range of motion and neck supple. No tenderness.     Right lower leg: No edema.     Left lower leg: No edema.     Comments: LUE contracture, LLE mild malalignment; left hemiparesis/spasticity   Lymphadenopathy:     Cervical: No cervical adenopathy.  Skin:    General: Skin is warm and dry.  Neurological:     Mental Status: She is alert and oriented to person, place, and time. Mental status is at baseline.     Cranial Nerves: No cranial nerve deficit.  Psychiatric:        Mood and Affect: Mood normal.        Behavior: Behavior normal.        Thought Content: Thought content normal.        Judgment: Judgment normal.            No results found for any visits on 12/21/23.     Assessment & Plan:    Routine Health Maintenance and Physical Exam Discussed health promotion and safety including diet and exercise recommendations, dental health, and injury prevention. Tobacco cessation if applicable. Seat belts, sunscreen, smoke detectors, etc.      Immunization History  Administered Date(s) Administered   DTaP 08/04/2002, 10/13/2002, 01/18/2003, 12/07/2003   Dtap, Unspecified 08/04/2002, 10/13/2002, 01/18/2003, 12/07/2003   HIB (PRP-T) 09/09/2002, 12/06/2002, 08/24/2003, 12/07/2003   HIB, Unspecified 09/09/2002, 12/06/2002, 08/24/2003, 12/07/2003   HPV 9-valent 02/13/2017, 02/26/2018   Hep B, Unspecified 08/04/2002, 10/13/2002, 01/18/2003   Hepatitis B, PED/ADOLESCENT 08/04/2002, 10/13/2002, 01/18/2003   Hpv-Unspecified 02/13/2017   IPV 08/04/2002, 10/13/2002, 01/18/2003, 11/23/2013   Influenza Nasal 02/12/2010   Influenza Split 02/29/2008, 02/26/2009, 04/18/2011, 03/19/2012, 03/28/2013, 03/29/2014, 01/31/2015    Influenza, Seasonal, Injecte, Preservative Fre 03/05/2023   Influenza,inj,Quad PF,6+ Mos 03/07/2016, 02/13/2017, 02/26/2018, 02/24/2019, 02/21/2020, 02/05/2022   Influenza-Unspecified 02/29/2008, 02/26/2009, 04/18/2011, 03/19/2012, 01/31/2015, 02/21/2022   MMR 08/24/2003, 12/12/2020   Meningococcal Acwy, Unspecified 11/23/2013   Meningococcal Conjugate 11/23/2013, 10/05/2019   Meningococcal Mcv4o 10/05/2019   Moderna Covid-19 Fall Seasonal Vaccine 8yrs & older 03/01/2022   Moderna Covid-19 Vaccine Bivalent Booster 86yrs & up 03/27/2021   PFIZER(Purple Top)SARS-COV-2 Vaccination 07/27/2019, 08/17/2019, 04/24/2020   Pfizer(Comirnaty)Fall  Seasonal Vaccine 12 years and older 03/05/2023   Pneumococcal Conjugate,unspecified 09/09/2002, 11/10/2002, 08/24/2003, 12/07/2003   Pneumococcal Conjugate-13 09/09/2002, 11/10/2002, 08/24/2003, 12/07/2003   Polio, Unspecified 08/04/2002, 10/13/2002, 01/18/2003, 11/23/2013   Td (Adult),5 Lf Tetanus Toxid, Preservative Free 07/28/2007   Td (Adult),unspecified 07/28/2007   Tdap 11/23/2013   Varicella 08/24/2003, 11/23/2013    Health Maintenance  Topic Date Due   Medicare Annual Wellness (AWV)  Never done   HIV Screening  Never done   Hepatitis C Screening  Never done   Cervical Cancer Screening (Pap smear)  Never done   DTaP/Tdap/Td (6 - Td or Tdap) 11/24/2023   Pneumococcal Vaccine (1 of 1 - PPSV23, PCV20, or PCV21) 07/07/2024 (Originally 05/13/2008)   INFLUENZA VACCINE  08/02/2024 (Originally 12/04/2023)   Meningococcal B Vaccine (1 of 2 - Standard) 12/17/2024 (Originally 05/13/2018)   HPV VACCINES  Completed   COVID-19 Vaccine  Completed   Hepatitis B Vaccines 19-59 Average Risk  Discontinued        Problem List Items Addressed This Visit       Active Problems   CP (cerebral palsy), spastic, 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[name]) (Chronic)   Balance problem Mom found a new PT place they want her to try, but unsure of name. They will message me back so I can send  the referral.     Iron deficiency   Relevant Orders   CBC with Differential/Platelet   IBC + Ferritin   Vitamin D  deficiency   Relevant Orders   VITAMIN D  25 Hydroxy (Vit-D Deficiency, Fractures)   Other Visit Diagnoses       Annual physical exam    -  Primary   Relevant Orders   CBC with Differential/Platelet   Comprehensive metabolic panel with GFR   Lipid panel   TSH           PATIENT COUNSELING:   Advised to take 1 mg of folate supplement per day if capable of pregnancy.    Recommend that most people either abstain from alcohol or drink within safe limits (<=14/week and <=4 drinks/occasion for males, <=7/weeks and <= 3 drinks/occasion for females) and that the risk for alcohol disorders and other health effects rises proportionally with the number of drinks per week and how often a drinker exceeds daily limits.   Diet: Recommend to adjust caloric intake to maintain or achieve ideal body weight, to reduce intake of dietary saturated fat and total fat, to limit sodium intake by avoiding high sodium foods and not adding table salt, and to maintain adequate dietary potassium and calcium preferably from fresh fruits, vegetables, and low-fat dairy products.   Emphasized the importance of regular exercise.  Injury prevention: Recommend seatbelts, safety helmets, smoke detector, etc..   Dental health: Recommend regular tooth brushing, flossing, and dental visits.      Return in about 1 year (around 12/20/2024) for physical.     Waddell KATHEE Mon, NP

## 2023-12-21 NOTE — Addendum Note (Signed)
 Addended by: Kamran Coker L on: 12/21/2023 02:06 PM   Modules accepted: Orders

## 2023-12-22 ENCOUNTER — Telehealth: Payer: Self-pay | Admitting: Neurology

## 2023-12-22 ENCOUNTER — Ambulatory Visit: Payer: Self-pay | Admitting: Family Medicine

## 2023-12-22 LAB — CBC WITH DIFFERENTIAL/PLATELET
Basophils Absolute: 0 K/uL (ref 0.0–0.1)
Basophils Relative: 0.3 % (ref 0.0–3.0)
Eosinophils Absolute: 0.1 K/uL (ref 0.0–0.7)
Eosinophils Relative: 1.6 % (ref 0.0–5.0)
HCT: 36.7 % (ref 36.0–46.0)
Hemoglobin: 12.5 g/dL (ref 12.0–15.0)
Lymphocytes Relative: 13.3 % (ref 12.0–46.0)
Lymphs Abs: 1 K/uL (ref 0.7–4.0)
MCHC: 34 g/dL (ref 30.0–36.0)
MCV: 91.6 fl (ref 78.0–100.0)
Monocytes Absolute: 0.5 K/uL (ref 0.1–1.0)
Monocytes Relative: 6.6 % (ref 3.0–12.0)
Neutro Abs: 6.1 K/uL (ref 1.4–7.7)
Neutrophils Relative %: 78.2 % — ABNORMAL HIGH (ref 43.0–77.0)
Platelets: 232 K/uL (ref 150.0–400.0)
RBC: 4 Mil/uL (ref 3.87–5.11)
RDW: 12.4 % (ref 11.5–15.5)
WBC: 7.8 K/uL (ref 4.0–10.5)

## 2023-12-22 LAB — COMPREHENSIVE METABOLIC PANEL WITH GFR
ALT: 12 U/L (ref 0–35)
AST: 12 U/L (ref 0–37)
Albumin: 4.3 g/dL (ref 3.5–5.2)
Alkaline Phosphatase: 54 U/L (ref 39–117)
BUN: 13 mg/dL (ref 6–23)
CO2: 26 meq/L (ref 19–32)
Calcium: 9.3 mg/dL (ref 8.4–10.5)
Chloride: 107 meq/L (ref 96–112)
Creatinine, Ser: 0.54 mg/dL (ref 0.40–1.20)
GFR: 131.43 mL/min (ref >60.00)
Glucose, Bld: 75 mg/dL (ref 70–99)
Potassium: 3.8 meq/L (ref 3.5–5.1)
Sodium: 141 meq/L (ref 135–145)
Total Bilirubin: 0.8 mg/dL (ref 0.2–1.2)
Total Protein: 6.8 g/dL (ref 6.0–8.3)

## 2023-12-22 LAB — IBC + FERRITIN
Ferritin: 53 ng/mL (ref 10.0–291.0)
Iron: 59 ug/dL (ref 42–145)
Saturation Ratios: 19 % — ABNORMAL LOW (ref 20.0–50.0)
TIBC: 310.8 ug/dL (ref 250.0–450.0)
Transferrin: 222 mg/dL (ref 212.0–360.0)

## 2023-12-22 LAB — TSH: TSH: 1.18 u[IU]/mL (ref 0.35–5.50)

## 2023-12-22 LAB — VITAMIN D 25 HYDROXY (VIT D DEFICIENCY, FRACTURES): VITD: 20.84 ng/mL — ABNORMAL LOW (ref 30.00–100.00)

## 2023-12-22 LAB — LIPID PANEL
Cholesterol: 136 mg/dL (ref 0–200)
HDL: 42.4 mg/dL (ref >39.00)
LDL Cholesterol: 79 mg/dL (ref 0–99)
NonHDL: 93.9
Total CHOL/HDL Ratio: 3
Triglycerides: 75 mg/dL (ref 0.0–149.0)
VLDL: 15 mg/dL (ref 0.0–40.0)

## 2023-12-22 NOTE — Telephone Encounter (Signed)
 Patient is due for Tdap. Please give okay for nurse visit.   Copied from CRM #[id number]. Topic: Appointments - Scheduling Inquiry for Clinic >> Dec 22, 2023  3:21 PM Mesmerise C wrote: Reason for CRM: Patient's mother stated patient needs Tdap asap for camp but next appointment is 8/26 states should've been done at appointment on 8/18 needs it for camp in order to go, offered wait list but declined would like call back [phone] to discuss options

## 2023-12-23 NOTE — Telephone Encounter (Signed)
 Okay given from Waddell, please schedule for next available nurse visit. Thanks!

## 2023-12-23 NOTE — Telephone Encounter (Signed)
 Nurse visit for Tdap is fine

## 2023-12-24 NOTE — Telephone Encounter (Signed)
 Lvm to call back and schedule

## 2024-01-21 ENCOUNTER — Encounter: Payer: Self-pay | Admitting: Physical Therapy

## 2024-01-21 ENCOUNTER — Encounter: Payer: Self-pay | Admitting: Family Medicine

## 2024-01-21 DIAGNOSIS — G800 Spastic quadriplegic cerebral palsy: Secondary | ICD-10-CM

## 2024-01-21 DIAGNOSIS — M25539 Pain in unspecified wrist: Secondary | ICD-10-CM

## 2024-01-26 NOTE — Addendum Note (Signed)
 Addended by: Nyzaiah Kai L on: 01/26/2024 04:07 PM   Modules accepted: Orders

## 2024-02-03 ENCOUNTER — Encounter: Payer: Self-pay | Admitting: Family Medicine

## 2024-02-08 ENCOUNTER — Encounter: Admitting: Family Medicine

## 2024-02-25 ENCOUNTER — Ambulatory Visit (INDEPENDENT_AMBULATORY_CARE_PROVIDER_SITE_OTHER): Admitting: Family Medicine

## 2024-02-25 ENCOUNTER — Encounter: Payer: Self-pay | Admitting: Family Medicine

## 2024-02-25 VITALS — Ht 60.75 in | Wt 116.0 lb

## 2024-02-25 DIAGNOSIS — Z0000 Encounter for general adult medical examination without abnormal findings: Secondary | ICD-10-CM | POA: Diagnosis not present

## 2024-02-25 DIAGNOSIS — R2689 Other abnormalities of gait and mobility: Secondary | ICD-10-CM

## 2024-02-25 DIAGNOSIS — G800 Spastic quadriplegic cerebral palsy: Secondary | ICD-10-CM

## 2024-02-25 NOTE — Progress Notes (Deleted)
 Annual Wellness Visit     Patient: Kerri Robertson, Female    DOB: [birth year], 23 y.o.   MRN: [id number]  Subjective  Chief Complaint  Patient presents with   Medicare Wellness    Erica Richwine is a 23 y.o. female who presents today for her Annual Wellness Visit. She reports consuming a {diet types:17450} diet. {Exercise:19826} She generally feels {well/fairly well/poorly:18703}. She reports sleeping {well/fairly well/poorly:18703}. She {does/does not:200015} have additional problems to discuss today.   HPI  {VISON DENTAL STD PSA (Optional):27386}   {History (Optional):23778}  Medications: Outpatient Medications Prior to Visit  Medication Sig   cetirizine  (ZYRTEC ) 10 MG tablet Take 1 tablet (10 mg total) by mouth daily.   Cholecalciferol (VITAMIN D -3) 25 MCG (1000 UT) CAPS Take 1 capsule by mouth daily.   FEROSUL 325 (65 Fe) MG tablet Take 1 tablet (325 mg total) by mouth every morning.   fluticasone  (FLONASE ) 50 MCG/ACT nasal spray Place 2 sprays into both nostrils daily.   Midazolam  (NAYZILAM ) 5 MG/0.1ML SOLN Use 1 spray in 1 nostril as needed for seizure lasting longer than 2 minutes or for seizure cluster.  May repeat in 5 minutes if needed.   Multiple Vitamin (MULTIVITAMIN WITH MINERALS) TABS tablet Take 1 tablet by mouth daily.   propranolol  (INDERAL ) 10 MG tablet TAKE 1/2 TABLET(5 MG) BY MOUTH THREE TIMES DAILY   Ubrogepant  (UBRELVY ) 100 MG TABS Take 1 tablet (100 mg total) by mouth every 2 (two) hours as needed. Maximum 200mg  a day.   [DISCONTINUED] albuterol  (PROVENTIL ) (2.5 MG/3ML) 0.083% nebulizer solution Take 3 mLs (2.5 mg total) by nebulization every 6 (six) hours as needed for wheezing or shortness of breath.   [DISCONTINUED] benzonatate  (TESSALON ) 200 MG capsule Take 1 capsule (200 mg total) by mouth 2 (two) times daily as needed for cough.   [DISCONTINUED] ondansetron  (ZOFRAN ) 4 MG tablet Take 1 tablet (4 mg total) by mouth every 8 (eight) hours as needed for  nausea or vomiting. (Patient taking differently: Take 4 mg by mouth as needed for nausea or vomiting.)   [DISCONTINUED] promethazine  (PHENERGAN ) 25 MG suppository Place 1 suppository (25 mg total) rectally every 6 (six) hours as needed for nausea or vomiting. Take if vomiting and cannot take oral zofran  or zofran  doesn't work   No facility-administered medications prior to visit.    Allergies  Allergen Reactions   Benzodiazepines Nausea And Vomiting and Other (See Comments)    Sensitivity   Fentanyl Nausea Only   Lactose Other (See Comments)   Latex Itching and Other (See Comments)    Patient Care Team: Almarie Waddell NOVAK, NP as PCP - General (Family Medicine)  ROS      Objective  Ht 5' 0.75 (1.543 m)   Wt 116 lb (52.6 kg)   BMI 22.10 kg/m  {Vitals History (Optional):23777}  Physical Exam    Most recent functional status assessment:     No data to display         Most recent fall risk assessment:    02/25/2024    3:01 PM  Fall Risk   Falls in the past year? 1  Number falls in past yr: 1  Injury with Fall? 0  Risk for fall due to : No Fall Risks  Follow up Falls evaluation completed    Most recent depression screenings:    02/25/2024    3:01 PM 12/21/2023    1:26 PM  PHQ 2/9 Scores  PHQ - 2 Score 0 0  PHQ-  9 Score 2    Most recent cognitive screening:     No data to display         Most recent Audit-C alcohol use screening    02/25/2024    3:17 PM  Alcohol Use Disorder Test (AUDIT)  1. How often do you have a drink containing alcohol? 0  2. How many drinks containing alcohol do you have on a typical day when you are drinking? 0  3. How often do you have six or more drinks on one occasion? 0  AUDIT-C Score 0   A score of 3 or more in women, and 4 or more in men indicates increased risk for alcohol abuse, EXCEPT if all of the points are from question 1   Vision/Hearing Screen: No results found.  {Labs (Optional):23779}  No results found for  any visits on 02/25/24.    Assessment & Plan   Annual wellness visit done today including the all of the following: Reviewed patient's Family Medical History Reviewed and updated list of patient's medical providers Assessment of cognitive impairment was done Assessed patient's functional ability Established a written schedule for health screening services Health Risk Assessent Completed and Reviewed  Exercise Activities and Dietary recommendations  Goals   None     Immunization History  Administered Date(s) Administered   DTaP 08/04/2002, 10/13/2002, 01/18/2003, 12/07/2003   Dtap, Unspecified 08/04/2002, 10/13/2002, 01/18/2003, 12/07/2003   HIB (PRP-T) 09/09/2002, 12/06/2002, 08/24/2003, 12/07/2003   HIB, Unspecified 09/09/2002, 12/06/2002, 08/24/2003, 12/07/2003   HPV 9-valent 02/13/2017, 02/26/2018   Hep B, Unspecified 08/04/2002, 10/13/2002, 01/18/2003   Hepatitis B, PED/ADOLESCENT 08/04/2002, 10/13/2002, 01/18/2003   Hpv-Unspecified 02/13/2017   IPV 08/04/2002, 10/13/2002, 01/18/2003, 11/23/2013   Influenza Nasal 02/12/2010   Influenza Split 02/29/2008, 02/26/2009, 04/18/2011, 03/19/2012, 03/28/2013, 03/29/2014, 01/31/2015   Influenza, Seasonal, Injecte, Preservative Fre 03/05/2023, 02/20/2024   Influenza,inj,Quad PF,6+ Mos 03/07/2016, 02/13/2017, 02/26/2018, 02/24/2019, 02/21/2020, 02/05/2022   Influenza-Unspecified 02/29/2008, 02/26/2009, 04/18/2011, 03/19/2012, 01/31/2015, 02/21/2022   MMR 08/24/2003, 12/12/2020   Meningococcal Acwy, Unspecified 11/23/2013   Meningococcal Conjugate 11/23/2013, 10/05/2019   Meningococcal Mcv4o 10/05/2019   Moderna Covid-19 Fall Seasonal Vaccine 51yrs & older 03/01/2022   Moderna Covid-19 Vaccine Bivalent Booster 80yrs & up 03/27/2021   PFIZER(Purple Top)SARS-COV-2 Vaccination 07/27/2019, 08/17/2019, 04/24/2020   Pfizer(Comirnaty)Fall Seasonal Vaccine 12 years and older 03/05/2023   Pneumococcal Conjugate,unspecified 09/09/2002,  11/10/2002, 08/24/2003, 12/07/2003   Pneumococcal Conjugate-13 09/09/2002, 11/10/2002, 08/24/2003, 12/07/2003   Polio, Unspecified 08/04/2002, 10/13/2002, 01/18/2003, 11/23/2013   Td (Adult),5 Lf Tetanus Toxid, Preservative Free 07/28/2007   Td (Adult),unspecified 07/28/2007   Tdap 11/23/2013, 12/22/2023   Varicella 08/24/2003, 11/23/2013    Health Maintenance  Topic Date Due   HIV Screening  Never done   Hepatitis C Screening  Never done   Cervical Cancer Screening (Pap smear)  Never done   Pneumococcal Vaccine (1 of 1 - PPSV23, PCV20, or PCV21) 07/07/2024 (Originally 05/13/2008)   Meningococcal B Vaccine (1 of 2 - Standard) 12/17/2024 (Originally 05/13/2018)   COVID-19 Vaccine (7 - 2025-26 season) 02/23/2025 (Originally 01/04/2024)   Medicare Annual Wellness (AWV)  02/24/2025   DTaP/Tdap/Td (7 - Td or Tdap) 12/21/2033   Influenza Vaccine  Completed   HPV VACCINES  Completed   Hepatitis B Vaccines 19-59 Average Risk  Discontinued     Discussed health benefits of physical activity, and encouraged her to engage in regular exercise appropriate for her age and condition.    Problem List Items Addressed This Visit       Nervous and  Auditory   CP (cerebral palsy), spastic, 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[name]) (Chronic)   Relevant Orders   Ambulatory referral to Occupational Therapy   Ambulatory referral to Physical Therapy     Other   Balance problem   Relevant Orders   Ambulatory referral to Occupational Therapy   Ambulatory referral to Physical Therapy   Other Visit Diagnoses       Welcome to Medicare preventive visit    -  Primary     Encounter for Medicare annual wellness exam           No follow-ups on file.

## 2024-02-25 NOTE — Progress Notes (Deleted)
  error

## 2024-02-25 NOTE — Progress Notes (Signed)
 Subjective:    Kerri Robertson is a 23 y.o. female who presents for a Welcome to Medicare exam. Her mother is with her today.  Cardiac Risk Factors include: none      Objective:    Today's Vitals   02/25/24 1457  Weight: 116 lb (52.6 kg)  Height: 5' 0.75 (1.543 m)  Body mass index is 22.1 kg/m.  Medications Outpatient Encounter Medications as of 02/25/2024  Medication Sig   cetirizine  (ZYRTEC ) 10 MG tablet Take 1 tablet (10 mg total) by mouth daily.   Cholecalciferol (VITAMIN D -3) 25 MCG (1000 UT) CAPS Take 1 capsule by mouth daily.   FEROSUL 325 (65 Fe) MG tablet Take 1 tablet (325 mg total) by mouth every morning.   fluticasone  (FLONASE ) 50 MCG/ACT nasal spray Place 2 sprays into both nostrils daily.   Midazolam  (NAYZILAM ) 5 MG/0.1ML SOLN Use 1 spray in 1 nostril as needed for seizure lasting longer than 2 minutes or for seizure cluster.  May repeat in 5 minutes if needed.   Multiple Vitamin (MULTIVITAMIN WITH MINERALS) TABS tablet Take 1 tablet by mouth daily.   propranolol  (INDERAL ) 10 MG tablet TAKE 1/2 TABLET(5 MG) BY MOUTH THREE TIMES DAILY   Ubrogepant  (UBRELVY ) 100 MG TABS Take 1 tablet (100 mg total) by mouth every 2 (two) hours as needed. Maximum 200mg  a day.   [DISCONTINUED] albuterol  (PROVENTIL ) (2.5 MG/3ML) 0.083% nebulizer solution Take 3 mLs (2.5 mg total) by nebulization every 6 (six) hours as needed for wheezing or shortness of breath.   [DISCONTINUED] benzonatate  (TESSALON ) 200 MG capsule Take 1 capsule (200 mg total) by mouth 2 (two) times daily as needed for cough.   [DISCONTINUED] ondansetron  (ZOFRAN ) 4 MG tablet Take 1 tablet (4 mg total) by mouth every 8 (eight) hours as needed for nausea or vomiting. (Patient taking differently: Take 4 mg by mouth as needed for nausea or vomiting.)   [DISCONTINUED] promethazine  (PHENERGAN ) 25 MG suppository Place 1 suppository (25 mg total) rectally every 6 (six) hours as needed for nausea or vomiting. Take if vomiting and  cannot take oral zofran  or zofran  doesn't work   No facility-administered encounter medications on file as of 02/25/2024.     History: Past Medical History:  Diagnosis Date   Allergy    Cerebral palsy ([name])    CP (cerebral palsy) ([name])    Stroke [facility])    Past Surgical History:  Procedure Laterality Date   ARM SURGERY     2013   FOOT SURGERY     2010   HAMSTRING LENGTHENING     TIBIA / FIBIA LENGTHENING      Family History  Problem Relation Age of Onset   Hypertension Mother    Migraines Mother    Depression Mother    Anxiety disorder Mother    Hypertension Father    Depression Father    Anxiety disorder Father    Hypertension Maternal Grandmother    Hypertension Maternal Grandfather    Heart disease Other    Diabetes Other    Seizures Neg Hx    Bipolar disorder Neg Hx    Schizophrenia Neg Hx    ADD / ADHD Neg Hx    Autism Neg Hx    Social History   Occupational History   Not on file  Tobacco Use   Smoking status: Never   Smokeless tobacco: Never  Vaping Use   Vaping status: Never Used  Substance and Sexual Activity   Alcohol use: Never  Drug use: Never   Sexual activity: Not on file    Tobacco Counseling Counseling given: Not Answered   Immunizations and Health Maintenance Immunization History  Administered Date(s) Administered   DTaP 08/04/2002, 10/13/2002, 01/18/2003, 12/07/2003   Dtap, Unspecified 08/04/2002, 10/13/2002, 01/18/2003, 12/07/2003   HIB (PRP-T) 09/09/2002, 12/06/2002, 08/24/2003, 12/07/2003   HIB, Unspecified 09/09/2002, 12/06/2002, 08/24/2003, 12/07/2003   HPV 9-valent 02/13/2017, 02/26/2018   Hep B, Unspecified 08/04/2002, 10/13/2002, 01/18/2003   Hepatitis B, PED/ADOLESCENT 08/04/2002, 10/13/2002, 01/18/2003   Hpv-Unspecified 02/13/2017   IPV 08/04/2002, 10/13/2002, 01/18/2003, 11/23/2013   Influenza Nasal 02/12/2010   Influenza Split 02/29/2008, 02/26/2009, 04/18/2011, 03/19/2012, 03/28/2013, 03/29/2014, 01/31/2015    Influenza, Seasonal, Injecte, Preservative Fre 03/05/2023, 02/20/2024   Influenza,inj,Quad PF,6+ Mos 03/07/2016, 02/13/2017, 02/26/2018, 02/24/2019, 02/21/2020, 02/05/2022   Influenza-Unspecified 02/29/2008, 02/26/2009, 04/18/2011, 03/19/2012, 01/31/2015, 02/21/2022   MMR 08/24/2003, 12/12/2020   Meningococcal Acwy, Unspecified 11/23/2013   Meningococcal Conjugate 11/23/2013, 10/05/2019   Meningococcal Mcv4o 10/05/2019   Moderna Covid-19 Fall Seasonal Vaccine 5yrs & older 03/01/2022   Moderna Covid-19 Vaccine Bivalent Booster 80yrs & up 03/27/2021   PFIZER(Purple Top)SARS-COV-2 Vaccination 07/27/2019, 08/17/2019, 04/24/2020   Pfizer(Comirnaty)Fall Seasonal Vaccine 12 years and older 03/05/2023   Pneumococcal Conjugate,unspecified 09/09/2002, 11/10/2002, 08/24/2003, 12/07/2003   Pneumococcal Conjugate-13 09/09/2002, 11/10/2002, 08/24/2003, 12/07/2003   Polio, Unspecified 08/04/2002, 10/13/2002, 01/18/2003, 11/23/2013   Td (Adult),5 Lf Tetanus Toxid, Preservative Free 07/28/2007   Td (Adult),unspecified 07/28/2007   Tdap 11/23/2013, 12/22/2023   Varicella 08/24/2003, 11/23/2013   Health Maintenance Due  Topic Date Due   HIV Screening  Never done   Hepatitis C Screening  Never done   Cervical Cancer Screening (Pap smear)  Never done    Activities of Daily Living    02/25/2024    3:34 PM  In your present state of health, do you have any difficulty performing the following activities:  Hearing? 0  Vision? 0  Difficulty concentrating or making decisions? 0  Walking or climbing stairs? 1  Comment baseline d/t chronic condition  Dressing or bathing? 1  Comment d/c chronic conditions  Doing errands, shopping? 1  Comment d/t chronic conditions  Preparing Food and eating ? N  Using the Toilet? N  In the past six months, have you accidently leaked urine? N  Do you have problems with loss of bowel control? Y  Comment rare  Managing your Medications? Y  Comment guidance needed at  baseline  Managing your Finances? Y  Comment baseline, chornic conditions  Housekeeping or managing your Housekeeping? Y  Comment d/t chronic conditions    Physical Exam   Physical Exam Vitals reviewed.  Constitutional:      General: She is not in acute distress.    Appearance: Normal appearance. She is normal weight. She is not ill-appearing.  Neurological:     Mental Status: She is alert and oriented to person, place, and time. Mental status is at baseline.    (optional), or other factors deemed appropriate based on the beneficiary's medical and social history and current clinical standards.   Advanced Directives:    EKG:  Declined, NSR in 2021      Assessment:    This is a routine wellness examination for this patient .   Vision/Hearing screen No results found.   Goals      DIET - EAT MORE FRUITS AND VEGETABLES     Admits she is a picky eater. Recommend trying a variety of foods.         Depression Screen  02/25/2024    3:45 PM 02/25/2024    3:01 PM 12/21/2023    1:26 PM 03/17/2022    3:19 PM  PHQ 2/9 Scores  PHQ - 2 Score 0 0 0 0  PHQ- 9 Score 2 2       Fall Risk    02/25/2024    3:44 PM  Fall Risk   Falls in the past year? 1  Number falls in past yr: 1  Injury with Fall? 0  Risk for fall due to : History of fall(s)  Follow up Falls evaluation completed         Patient Care Team: Almarie Waddell NOVAK, NP as PCP - General (Family Medicine)     Plan:     I have personally reviewed and noted the following in the patient's chart:   Medical and social history Use of alcohol, tobacco or illicit drugs  Current medications and supplements including opioid prescriptions. Patient is not currently taking opioid prescriptions. Functional ability and status Nutritional status Physical activity Advanced directives List of other physicians Hospitalizations, surgeries, and ER visits in previous 12 months Vitals Screenings to include cognitive,  depression, and falls Referrals and appointments  In addition, I have reviewed and discussed with patient certain preventive protocols, quality metrics, and best practice recommendations. A written personalized care plan for preventive services as well as general preventive health recommendations were provided to patient.     Waddell NOVAK Almarie, NP 02/25/2024

## 2024-03-11 ENCOUNTER — Encounter: Payer: Self-pay | Admitting: Family Medicine

## 2024-04-20 ENCOUNTER — Ambulatory Visit: Attending: Family Medicine | Admitting: Physical Therapy

## 2024-04-20 ENCOUNTER — Ambulatory Visit: Admitting: Occupational Therapy

## 2024-04-20 ENCOUNTER — Encounter: Payer: Self-pay | Admitting: Physical Therapy

## 2024-04-20 ENCOUNTER — Encounter: Payer: Self-pay | Admitting: Occupational Therapy

## 2024-04-20 DIAGNOSIS — R262 Difficulty in walking, not elsewhere classified: Secondary | ICD-10-CM | POA: Insufficient documentation

## 2024-04-20 DIAGNOSIS — R278 Other lack of coordination: Secondary | ICD-10-CM

## 2024-04-20 DIAGNOSIS — R531 Weakness: Secondary | ICD-10-CM | POA: Insufficient documentation

## 2024-04-20 DIAGNOSIS — G801 Spastic diplegic cerebral palsy: Secondary | ICD-10-CM | POA: Diagnosis present

## 2024-04-20 DIAGNOSIS — I69954 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: Secondary | ICD-10-CM | POA: Diagnosis present

## 2024-04-20 DIAGNOSIS — R29818 Other symptoms and signs involving the nervous system: Secondary | ICD-10-CM

## 2024-04-20 DIAGNOSIS — M6281 Muscle weakness (generalized): Secondary | ICD-10-CM

## 2024-04-20 DIAGNOSIS — R2689 Other abnormalities of gait and mobility: Secondary | ICD-10-CM | POA: Insufficient documentation

## 2024-04-20 DIAGNOSIS — R2681 Unsteadiness on feet: Secondary | ICD-10-CM | POA: Diagnosis present

## 2024-04-20 DIAGNOSIS — G800 Spastic quadriplegic cerebral palsy: Secondary | ICD-10-CM | POA: Insufficient documentation

## 2024-04-20 DIAGNOSIS — R293 Abnormal posture: Secondary | ICD-10-CM | POA: Insufficient documentation

## 2024-04-20 NOTE — Therapy (Signed)
 OUTPATIENT PHYSICAL THERAPY NEURO EVALUATION   Patient Name: Kerri Robertson MRN: [id number] DOB:[birth year], 23 y.o., female Today's Date: 04/20/2024   PCP: Waddell Mon REFERRING PROVIDER: Waddell Mon  END OF SESSION:  PT End of Session - 04/20/24 1244     Visit Number 1    Date for Recertification  07/19/24    Authorization Type Medicare    PT Start Time 1314    PT Stop Time 1400    PT Time Calculation (min) 46 min    Activity Tolerance Patient tolerated treatment well    Behavior During Therapy WFL for tasks assessed/performed           Past Medical History:  Diagnosis Date   Allergy    Cerebral palsy ([name])    CP (cerebral palsy) ([name])    Stroke ([name])    Past Surgical History:  Procedure Laterality Date   ARM SURGERY     2013   FOOT SURGERY     2010   HAMSTRING LENGTHENING     TIBIA / FIBIA LENGTHENING     Patient Active Problem List   Diagnosis Date Noted   Iron deficiency 12/21/2023   Vitamin D  deficiency 12/21/2023   Bilateral impacted cerumen 09/30/2022   Migraine without aura and without status migrainosus, not intractable 06/05/2022   Patellofemoral pain syndrome of left knee 06/03/2022   Knee cartilage, torn, left 05/21/2022   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   Balance problem 05/06/2018   Syncope 05/06/2018   History of cerebrovascular accident 11/18/2017   CP (cerebral palsy), spastic, 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[name]) 06/25/2017   Anxiety state 06/25/2017   Spasticity 06/25/2017   Allergic rhinitis 10/21/2016   Mild intermittent asthma 10/21/2016    ONSET DATE: 07/09/23- new referral  REFERRING DIAG:  G80.0 (ICD-10-CM) - CP (cerebral palsy), spastic, 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[name])      THERAPY DIAG:  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  She reports that she has not continued to do any exercises.  Pt accompanied by:  caretaker   PERTINENT HISTORY: CP 07/09/23-- Kerri Robertson is a 23 year old female who presents for a new physical therapy referral   PAIN:  Are you having pain? No  PRECAUTIONS: Fall  RED FLAGS: None   WEIGHT BEARING RESTRICTIONS: No  FALLS: Has patient fallen in last 6 months? Yes. Number of falls 1  LIVING ENVIRONMENT: Lives with: lives with their family Lives in: House/apartment Stairs: 12 steps , has a handrail  PLOF: Independent with basic ADLs  Works at [employer] and at [employer], both jobs mostly sitting  PATIENT GOALS: build more endurance, will be getting a leg brace after she turns 24  OBJECTIVE:  Note: Objective measures were completed at Evaluation unless otherwise noted.  DIAGNOSTIC FINDINGS: IMPRESSION: 1. The acetabulum is relatively shallow and the left femoral head demonstrates an unusual contour. These findings may be developmental secondary to the patient's cerebral palsy. 2. No other abnormalities.  COGNITION: Overall cognitive status: Within functional limits for tasks assessed   SENSATION: WFL  COORDINATION: Decrease coordination d/t CP  MUSCLE TONE: LLE: Mild and Moderate  MUSCLE LENGTH: Hamstrings and hip flexors are tight on both sides.   POSTURE: rounded shoulders, left pelvic obliquity, and weight shift right  LOWER EXTREMITY ROM:  Trunk lateraly flexed due to scoliosis, L hip  unable to achieve neutral ER, abd limited, B hamstrings tight, L ankle does not achieve neutral DF or inversion. Her L foot in turned inwards more, increased IR of hip. LUE with limited ROM at all joints, in all planes. She wears a splint on her wrist at night some nights.   LOWER EXTREMITY MMT:  MMT accuracy is questionable due to tone   MMT Right Eval Left Eval  Hip flexion 4 4-  Hip extension    Hip abduction 4 3+  Hip adduction 4 3+  Hip internal rotation    Hip external rotation    Knee flexion 4 3  Knee extension 4 4-  Ankle dorsiflexion  1  Ankle plantarflexion    Ankle inversion  1  Ankle eversion    (Blank rows = not tested)  TRANSFERS: Assistive device utilized: None  Sit to stand: Modified independence Stand to sit: Modified independence Chair to chair: Modified independence Floor: Mod A  CURB:  Level of Assistance: Modified independence Assistive device utilized: None  STAIRS: Level of Assistance: Modified independence Stair Negotiation Technique: Step to Pattern Alternating Pattern  with Single Rail on Right  GAIT: Gait pattern: step to pattern, decreased arm swing- Left, decreased step length- Left, decreased stance time- Left, decreased stride length, ataxic, trunk rotated posterior- Left, narrow BOS, and poor foot clearance- Left Distance walked: clinic distances Assistive device utilized: None Level of assistance: Modified independence  FUNCTIONAL TESTS:  5 times sit to stand: 14.63s in March 2025, 17 seconds 04/20/24 Timed up and go (TUG): 13.06s March 2025, 15 seconds 04/20/24                                                                                TREATMENT DATE: 04/20/24 evaluation   PATIENT EDUCATION: Education details: POC, reviewed HEP with her and Litzy her caretaker today Person educated: Patient and Engineer, Structural  Litzy Education method: Explanation Education comprehension: verbalized understanding  HOME EXERCISE PROGRAM: Access  Code: [email or web address] URL: [email or web address] Date: 04/20/2024 Prepared by: Ozell Mainland  Exercises - Supine Bridge  - 1 x daily - 7 x weekly - 3 sets - 10 reps - 3 hold - Supine Lower Trunk Rotation  - 1 x daily - 7 x weekly - 3 sets - 10 reps - 3 hold - Standing Hip Abduction with Counter Support  - 1 x daily - 7 x weekly - 3 sets - 10 reps - 3 hold - Standing March with Counter Support  - 1 x daily - 7 x weekly - 3 sets - 10 reps - 3 hold  GOALS: Goals reviewed with patient? Yes  SHORT TERM GOALS: Target date: 05/21/24  Patient will be independent with initial HEP. Baseline: has an old HEP but not doing Goal status: INITIAL  2.  Patient will demonstrate decreased fall risk by scoring < 12 sec on 5xSTS. Baseline: 17 seconds Goal status: INITIAL   LONG TERM GOALS: Target date: 07/19/24  Patient will be independent with advanced/ongoing HEP to improve outcomes and carryover.  Baseline:  Goal status: INITIAL  2.  Patient will be able to complete floor transfer with minA   Baseline: mod-maxA needed when she falls Goal status: INITIAL  3.  Patient will report no falls in an 8 week period   Baseline: had a fall in November Goal status: INITIAL   4.  Patient will demonstrate improved functional LE strength as demonstrated by 4/5 in LLE. Baseline: see chart Goal status: INITIAL  5.  Patient will ambulate with more normalized gait pattern and less internal rotation of LLE  Baseline: caretakers reports her LLE in turning in more than usual  Goal status: INITIAL 6.  Patient will decrease TUG time to 12 seconds  Baseline:  15 seconds  Goal Status:  Initial   ASSESSMENT:  CLINICAL IMPRESSION: Patient is a 23 y.o. female who was seen today for physical therapy evaluation and treatment for CP. She is a returning patient who had fall recently, she reports that she is not picking up her feet very well.  She reports that her and her family recently moved and she was  struggling to go up and down the stairs at the new place.   Her LLE is weak but it is hard to tell how accurate it is due to her tone.  She has increased TUG and 5XSTS tests of about 2 seconds slower than back in March of this year.   Patient will benefit from ongoing strength training and balance to decrease her risk for falls. She will also benefit from stretching to increase her flexibility, especially in her hips.   OBJECTIVE IMPAIRMENTS: Abnormal gait, decreased balance, decreased coordination, decreased endurance, decreased ROM, decreased strength, impaired tone, impaired UE functional use, and postural dysfunction.   ACTIVITY LIMITATIONS: squatting, stairs, transfers, and locomotion level  PERSONAL FACTORS: Age, Behavior pattern, Past/current experiences, and Time since onset of injury/illness/exacerbation are also affecting patient's functional outcome.   REHAB POTENTIAL: Fair chronicity of symptoms  CLINICAL DECISION MAKING: Stable/uncomplicated  EVALUATION COMPLEXITY: Low  PLAN:  PT FREQUENCY: 1x/week  PT DURATION: 12 weeks  PLANNED INTERVENTIONS: 97110-Therapeutic exercises, 97530- Therapeutic activity, [id number]- Neuromuscular re-education, 97535- Self Care, [id number]- Manual therapy, [phone]- Gait training, Patient/Family education, Balance training, Stair training, Taping, Dry Needling, Joint mobilization, Spinal mobilization, Cryotherapy, and Moist heat  PLAN FOR NEXT SESSION: LE stretching, strengthening hip abd, balance, functional strengthening  OBADIAH OZELL ORN, PT 04/20/2024, 12:44 PM

## 2024-04-20 NOTE — Therapy (Signed)
 OUTPATIENT OCCUPATIONAL THERAPY NEURO EVALUATION  Patient Name: Kerri Robertson MRN: [id number] DOB:12/13/2000, 23 y.o., female Today's Date: 04/20/2024  PCP: Almarie Birmingham, NP REFERRING PROVIDER: Almarie Birmingham, NP  END OF SESSION:  OT End of Session - 04/20/24 1321     Visit Number 1    Number of Visits 12    Date for Recertification  07/13/24    Authorization Type 12 weeks    Authorization Time Period 12 weeks    Authorization - Visit Number 1    Progress Note Due on Visit 10    OT Start Time 1232    OT Stop Time 1312    OT Time Calculation (min) 40 min    Activity Tolerance Patient tolerated treatment well    Behavior During Therapy [facility] for tasks assessed/performed          Past Medical History:  Diagnosis Date   Allergy    Cerebral palsy ([name])    CP (cerebral palsy) ([name])    Stroke [facility])    Past Surgical History:  Procedure Laterality Date   ARM SURGERY     2013   FOOT SURGERY     2010   HAMSTRING LENGTHENING     TIBIA / FIBIA LENGTHENING     Patient Active Problem List   Diagnosis Date Noted   Iron deficiency 12/21/2023   Vitamin D  deficiency 12/21/2023   Bilateral impacted cerumen 09/30/2022   Migraine without aura and without status migrainosus, not intractable 06/05/2022   Patellofemoral pain syndrome of left knee 06/03/2022   Knee cartilage, torn, left 05/21/2022   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   Balance problem 05/06/2018   Syncope 05/06/2018   History of cerebrovascular accident 11/18/2017   CP (cerebral palsy), spastic, 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[name]) 06/25/2017   Anxiety state 06/25/2017   Spasticity 06/25/2017   Allergic rhinitis 10/21/2016   Mild intermittent asthma 10/21/2016    ONSET DATE: 02/25/24- referral date  REFERRING DIAG: cerebral palsy  THERAPY DIAG:  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) - Plan: Ot plan of  care cert/re-cert  Muscle weakness (generalized) - Plan: Ot plan of care cert/re-cert  Unsteadiness on feet - Plan: Ot plan of care cert/re-cert  Other lack of coordination - Plan: Ot plan of care cert/re-cert  Other abnormalities of gait and mobility - Plan: Ot plan of care cert/re-cert  Other symptoms and signs involving the nervous system - Plan: Ot plan of care cert/re-cert  Rationale for Evaluation and Treatment: Rehabilitation  SUBJECTIVE:   SUBJECTIVE STATEMENT: Pt reports she has not been using LUE much Pt accompanied by: self  PERTINENT HISTORY: recent eye surgery, hx of CP see snapshot for PMH  PRECAUTIONS: Fall  WEIGHT BEARING RESTRICTIONSno  PAIN:  Are you having pain? No  FALLS: Has patient fallen in last 6 months? Yes. Number of falls tripped on crack  LIVING ENVIRONMENT: Lives with: lives with their family Lives in: House/apartment   PLOF: Needs assistance with ADLs and Needs assistance with homemaking  PATIENT GOALS: stretching HEP, splinting for LUE  OBJECTIVE:  Note: Objective measures were completed at Evaluation unless otherwise noted.  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 Shopping: needs assist for transportation Light housekeeping: does laundry, staightens up bedroom Meal Prep: cooks mac and cheese on stove top mod I, microwave breakfast  Community mobility: mod I Medication management: keeps up with meds, family assists  prn   MOBILITY STATUS: mod I      UPPER EXTREMITY ROM: LUE thumb remains flexed into palm, trace finger extension  Active ROM Right eval Left eval  Shoulder flexion  80  Shoulder abduction    Shoulder adduction    Shoulder extension    Shoulder internal rotation    Shoulder external rotation    Elbow flexion  120  Elbow extension  -20  Wrist flexion  60 at rest, passive extension to neutral  Wrist extension    Wrist ulnar deviation    Wrist radial deviation    Wrist pronation    Wrist supination    (Blank rows = not tested)     COORDINATION: Box and Blocks:  Right 3 blocks,   SENSATION: Light touch: Impaired  for LUE    MUSCLE TONE: LUE: Hypertonic,   COGNITION: Overall cognitive status: Pt was able to follow all commands related to eval    VISION ASSESSMENT:recent visual surgery, does not wear glasses       OBSERVATIONS: Pleasant female motivated to improve                                                                                                                              TREATMENT DATE: 04/20/24- eval, see pt education        PATIENT EDUCATION: Education details: role of OT, potential goals, beginning HEP- see pt instructions Person educated: Patient Education method: Explanation, demonstration Education comprehension: verbalized understanding, returned demonstration, and verbal cues required  HOME EXERCISE PROGRAM: beginning HEP,towel slides, thumb passive extension, picking up blocks 04/20/24   GOALS: Goals reviewed with patient? Yes  SHORT TERM GOALS: Target date: 04/20/24  I with inital HEP Baseline: Goal status: INITIAL  2.  I with updated splinting for improved LUE positioning. Baseline:  Goal status: INITIAL  3.  Pt will utilize LUE as a stabilizer/ gross assist at least 10% of the time. Baseline: uses only 5% of the time Goal status: INITIAL  4.  Pt will increase A/ROM shoulder flexion/ scaption to 90* for  improved functional reach. Baseline: 80* Goal status: INITIAL  5.  Pt will increase A/ROM elbow extension to -15 for improved functional use. Baseline:  Goal status: INITIAL    LONG TERM GOALS: Target date: 07/14/23  I with updated HEP Baseline:  Goal status: INITIAL  2.  Pt will demonstrate improved LUE functional use as evidenced by increasing box/ blocks score  to 5 blocks or greater. Baseline:  Goal status: INITIAL  3.  I with adapted strategies to mazimize pt's safety and I with ADLs/ IADLs. Baseline:  Goal status: INITIAL  4.  3.  Pt will utilize LUE as a stabilizer/ gross assist at least 15% of the time. Baseline: uses only 5% of the time Goal status: INITIAL     ASSESSMENT:  CLINICAL IMPRESSION: Patient is a 23 y.o. female  who was seen today for occupational therapy evaluation for cerebral palsy. Pt is well known to therapist from previous therapy. Pt presents with the performance deficits below. She can benefit from skilled occupational therapy to address these deficits in order to maximize pt's safety and I with ADLs/IADLs.   PERFORMANCE DEFICITS: in functional skills including ADLs, IADLs, coordination, dexterity, sensation, ROM, strength, flexibility, Fine motor control, Gross motor control, mobility, balance, decreased knowledge of precautions, decreased knowledge of use of DME, and UE functional use,  and psychosocial skills including coping strategies, environmental adaptation, habits, interpersonal interactions, and routines and behaviors.   IMPAIRMENTS: are limiting patient from ADLs, IADLs, work, play, leisure, and social participation.   CO-MORBIDITIES: may have co-morbidities  that affects occupational performance. Patient will benefit from skilled OT to address above impairments and improve overall function.  MODIFICATION OR ASSISTANCE TO COMPLETE EVALUATION: No modification of tasks or assist necessary to complete an evaluation.  OT OCCUPATIONAL  PROFILE AND HISTORY: Detailed assessment: Review of records and additional review of physical, cognitive, psychosocial history related to current functional performance.  CLINICAL DECISION MAKING: LOW - limited treatment options, no task modification necessary  REHAB POTENTIAL: Good  EVALUATION COMPLEXITY: Low    PLAN:  OT FREQUENCY: 1x/week  OT DURATION: 12 weeks  PLANNED INTERVENTIONS: 97168 OT Re-evaluation, 97535 self care/ADL training, 02889 therapeutic exercise, 97530 therapeutic activity, 97112 neuromuscular re-education, 97140 manual therapy, 97113 aquatic therapy, 97035 ultrasound, 97018 paraffin, 02989 moist heat, 97010 cryotherapy, 97760 Orthotic Initial, 97763 Orthotic/Prosthetic subsequent, passive range of motion, balance training, functional mobility training, energy conservation, coping strategies training, patient/family education, and DME and/or AE instructions  RECOMMENDED OTHER SERVICES: n/a  CONSULTED AND AGREED WITH PLAN OF CARE: Patient  PLAN FOR NEXT SESSION: further assess splint needs, stretching HEP   Braidyn Scorsone, OT 04/20/2024, 1:48 PM

## 2024-04-20 NOTE — Patient Instructions (Signed)
 SHOULDER: Flexion On Table    Place hands on table, elbows straight slide towel forwards straightening elbow . Move hips away from body. Press hands down into table. Hold _5__ seconds. __10_ reps per set, __1_ sets per day, __5_ days per week  Composite Extension (Passive)    Using other hand, straighten thumb completely gently, your thumb will not extend this much. Hold __5__ seconds. Repeat __10__ times. Do __1__ sessions per day.   Try to pick up blocks or checkers at home   Copyright  VHI. All rights reserved.

## 2024-05-16 ENCOUNTER — Ambulatory Visit: Attending: Family Medicine | Admitting: Occupational Therapy

## 2024-05-16 ENCOUNTER — Ambulatory Visit: Admitting: Physical Therapy

## 2024-05-16 ENCOUNTER — Encounter: Payer: Self-pay | Admitting: Physical Therapy

## 2024-05-16 ENCOUNTER — Encounter: Payer: Self-pay | Admitting: Occupational Therapy

## 2024-05-16 DIAGNOSIS — R262 Difficulty in walking, not elsewhere classified: Secondary | ICD-10-CM

## 2024-05-16 DIAGNOSIS — G800 Spastic quadriplegic cerebral palsy: Secondary | ICD-10-CM

## 2024-05-16 DIAGNOSIS — R293 Abnormal posture: Secondary | ICD-10-CM | POA: Insufficient documentation

## 2024-05-16 DIAGNOSIS — I69954 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: Secondary | ICD-10-CM | POA: Insufficient documentation

## 2024-05-16 DIAGNOSIS — R531 Weakness: Secondary | ICD-10-CM | POA: Insufficient documentation

## 2024-05-16 DIAGNOSIS — G801 Spastic diplegic cerebral palsy: Secondary | ICD-10-CM | POA: Insufficient documentation

## 2024-05-16 DIAGNOSIS — M6281 Muscle weakness (generalized): Secondary | ICD-10-CM

## 2024-05-16 DIAGNOSIS — R29818 Other symptoms and signs involving the nervous system: Secondary | ICD-10-CM | POA: Insufficient documentation

## 2024-05-16 DIAGNOSIS — R278 Other lack of coordination: Secondary | ICD-10-CM

## 2024-05-16 DIAGNOSIS — R2681 Unsteadiness on feet: Secondary | ICD-10-CM

## 2024-05-16 DIAGNOSIS — R2689 Other abnormalities of gait and mobility: Secondary | ICD-10-CM

## 2024-05-16 NOTE — Therapy (Signed)
 " OUTPATIENT PHYSICAL THERAPY NEURO TREATMENT   Patient Name: Kerri Robertson MRN: [id number] DOB:Jun 15, 2000, 24 y.o., female Today's Date: 05/16/2024   PCP: Waddell Mon REFERRING PROVIDER: Waddell Mon  END OF SESSION:  PT End of Session - 05/16/24 1249     Visit Number 2    Date for Recertification  07/19/24    Authorization Type Medicare    PT Start Time 1315    PT Stop Time 1400    PT Time Calculation (min) 45 min    Activity Tolerance Patient tolerated treatment well    Behavior During Therapy WFL for tasks assessed/performed           Past Medical History:  Diagnosis Date   Allergy    Cerebral palsy ([name])    CP (cerebral palsy) ([name])    Stroke [facility])    Past Surgical History:  Procedure Laterality Date   ARM SURGERY     2013   FOOT SURGERY     2010   HAMSTRING LENGTHENING     TIBIA / FIBIA LENGTHENING     Patient Active Problem List   Diagnosis Date Noted   Iron deficiency 12/21/2023   Vitamin D  deficiency 12/21/2023   Bilateral impacted cerumen 09/30/2022   Migraine without aura and without status migrainosus, not intractable 06/05/2022   Patellofemoral pain syndrome of left knee 06/03/2022   Knee cartilage, torn, left 05/21/2022   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   Balance problem 05/06/2018   Syncope 05/06/2018   History of cerebrovascular accident 11/18/2017   CP (cerebral palsy), spastic, 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[name]) 06/25/2017   Anxiety state 06/25/2017   Spasticity 06/25/2017   Allergic rhinitis 10/21/2016   Mild intermittent asthma 10/21/2016    ONSET DATE: 07/09/23- new referral  REFERRING DIAG:  G80.0 (ICD-10-CM) - CP (cerebral palsy), spastic, 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[name])      THERAPY DIAG:  Muscle weakness (generalized)  Unsteadiness on feet  Other lack of coordination  Abnormal posture  Spastic quadriplegic cerebral palsy ([name])  Other abnormalities of gait and mobility  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 Pt accompanied by: caretaker   PERTINENT HISTORY: CP 07/09/23-- Kerri Robertson is a 24 year old female who presents for a new physical therapy referral   PAIN:  Are you having pain? No  PRECAUTIONS: Fall  RED FLAGS: None   WEIGHT BEARING RESTRICTIONS: No  FALLS: Has patient fallen in last 6 months? Yes. Number of falls 1  LIVING ENVIRONMENT: Lives with: lives with their family Lives in: House/apartment Stairs: 12 steps , has a handrail  PLOF: Independent with basic ADLs  Works at [employer] and at [employer], both jobs mostly sitting  PATIENT GOALS: build more endurance, will be getting a leg brace after she turns 24  OBJECTIVE:  Note: Objective measures were completed at Evaluation unless otherwise noted.  DIAGNOSTIC FINDINGS: IMPRESSION: 1. The acetabulum is relatively shallow and the left femoral head demonstrates an unusual contour. These findings may be developmental secondary to the patient's cerebral palsy. 2. No other abnormalities.  COGNITION: Overall cognitive status: Within functional limits for tasks assessed   SENSATION: WFL  COORDINATION: Decrease coordination d/t CP  MUSCLE TONE: LLE: Mild and Moderate  MUSCLE LENGTH: Hamstrings and hip flexors are tight on both sides.   POSTURE: rounded shoulders, left pelvic obliquity, and weight shift right  LOWER EXTREMITY ROM:  Trunk  lateraly flexed due to scoliosis, L hip unable to achieve neutral ER, abd limited, B hamstrings tight, L ankle does not achieve neutral DF or inversion. Her L foot in turned inwards more, increased IR of hip. LUE with limited ROM at all joints, in all planes. She wears a splint on her wrist at night some nights.   LOWER EXTREMITY MMT:  MMT accuracy is questionable due to tone   MMT Right Eval Left Eval  Hip flexion 4 4-  Hip extension    Hip abduction 4 3+  Hip adduction 4 3+  Hip internal rotation    Hip external rotation    Knee flexion 4 3  Knee extension 4 4-  Ankle dorsiflexion  1  Ankle plantarflexion    Ankle inversion  1  Ankle eversion    (Blank rows = not tested)  TRANSFERS: Assistive device utilized: None  Sit to stand: Modified independence Stand to sit: Modified independence Chair to chair: Modified independence Floor: Mod A  CURB:  Level of Assistance: Modified independence Assistive device utilized: None  STAIRS: Level of Assistance: Modified independence Stair Negotiation Technique: Step to Pattern Alternating Pattern  with Single Rail on Right  GAIT: Gait pattern: step to pattern, decreased arm swing- Left, decreased step length- Left, decreased stance time- Left, decreased stride length, ataxic, trunk rotated posterior- Left, narrow BOS, and poor foot clearance- Left Distance walked: clinic distances Assistive device utilized: None Level of assistance: Modified independence  FUNCTIONAL TESTS:  5 times sit to stand: 14.63s in March 2025, 17 seconds 04/20/24 Timed up and go (TUG): 13.06s March 2025, 15 seconds 04/20/24                                                                                TREATMENT DATE: 05/16/24 Assessed right knee some popping and tight posterior, non tender Feet on ball  K2C, rotation, bridges and isometric abs Red tband clamshells in hooklying Supine red tband left hip extension, blue right Supine left leg abduction on  sliding board with her trying to keep toes up Passive stretch left LE Side lying left with 3# clamshells right  3# SAQ 2x10 each leg Passive stretch right LE Side steppping HHA LEg press with large ball between knees for positioning 20# 2x10 Walking ball toss  04/20/24 evaluation   PATIENT EDUCATION: Education details: POC, reviewed HEP with her and Litzy her caretaker today Person educated: Patient and Software Engineer Education method: Explanation Education comprehension: verbalized understanding  HOME EXERCISE PROGRAM: Access Code: [email or web address] URL: [email or web address] Date: 04/20/2024 Prepared by: Ozell Mainland  Exercises - Supine Bridge  - 1 x daily - 7 x weekly - 3 sets - 10 reps - 3 hold - Supine Lower Trunk Rotation  - 1 x daily - 7 x weekly - 3 sets - 10 reps - 3 hold - Standing Hip Abduction with Counter Support  - 1 x daily - 7 x weekly - 3 sets - 10 reps - 3 hold - Standing March with Counter Support  - 1 x daily - 7 x weekly - 3 sets - 10 reps - 3 hold  GOALS: Goals reviewed with patient? Yes  SHORT TERM GOALS: Target date: 05/21/24  Patient will be independent with initial HEP. Baseline: has an old HEP but not doing Goal status:Ongoing 05/16/24  2.  Patient will demonstrate decreased fall risk by scoring < 12 sec on 5xSTS. Baseline: 17 seconds Goal status: INITIAL   LONG TERM GOALS: Target date: 07/19/24  Patient will be independent with advanced/ongoing HEP to improve outcomes and carryover.  Baseline:  Goal status: INITIAL  2.  Patient will be able to complete floor transfer with minA   Baseline: mod-maxA needed when she falls Goal status: INITIAL  3.  Patient will report no falls in an 8 week period   Baseline: had a fall in November Goal status: INITIAL   4.  Patient will demonstrate improved functional LE strength as demonstrated by 4/5 in LLE. Baseline: see chart Goal status: INITIAL  5.  Patient will ambulate with more  normalized gait pattern and less internal rotation of LLE  Baseline: caretakers reports her LLE in turning in more than usual  Goal status: INITIAL 6.  Patient will decrease TUG time to 12 seconds  Baseline:  15 seconds  Goal Status:  Initial   ASSESSMENT:  CLINICAL IMPRESSION: Patient reports 2 falls since we have seen her last, she reports that her right knee has had some feelings like the left, I addressed this by strengthening and stretching both legs, especially hips and quads.  Patient is a 24 y.o. female who was seen today for physical therapy evaluation and treatment for CP. She is a returning patient who had fall recently, she reports that she is not picking up her feet very well.  She reports that her and her family recently moved and she was struggling to go up and down the stairs at the new place.   Her LLE is weak but it is hard to tell how accurate it is due to her tone.  She has increased TUG and 5XSTS tests of about 2 seconds slower than back in March of this year.   Patient will benefit from ongoing strength training and balance to decrease her risk for falls. She will also benefit from stretching to increase her flexibility, especially in her hips.  OBJECTIVE IMPAIRMENTS: Abnormal gait, decreased balance, decreased coordination, decreased endurance, decreased ROM, decreased strength, impaired tone, impaired UE functional use, and postural dysfunction.   ACTIVITY LIMITATIONS: squatting, stairs, transfers, and locomotion level  PERSONAL FACTORS: Age, Behavior pattern, Past/current experiences, and Time since onset of injury/illness/exacerbation are also affecting patient's functional outcome.   REHAB POTENTIAL: Fair chronicity of symptoms  CLINICAL DECISION MAKING: Stable/uncomplicated  EVALUATION COMPLEXITY: Low  PLAN:  PT FREQUENCY: 1x/week  PT DURATION: 12 weeks  PLANNED INTERVENTIONS: 97110-Therapeutic exercises, 97530- Therapeutic activity, 97112- Neuromuscular  re-education, 97535- Self Care, [id number]- Manual therapy, [phone]- Gait training, Patient/Family education, Balance training, Stair training, Taping, Dry Needling, Joint mobilization, Spinal mobilization, Cryotherapy, and Moist heat  PLAN FOR NEXT SESSION: LE stretching, strengthening hip abd, balance, functional strengthening   Savier Trickett W, PT 05/16/2024, 12:50 PM  "

## 2024-05-16 NOTE — Patient Instructions (Signed)
" ° °  Copyright  VHI. All rights reserved.  Shoulder: Flexion (Supine)    With hands shoulder width apart, slowly lower dowel to eye level. Do not let elbows bend. Keep back flat. Hold __5__ seconds. Repeat ___10_ times. Do _1-2___ sessions per day. CAUTION: Stretch slowly and gently.    ELBOW: Extension / Chest Press  (cane)    Lie on back with knees bent. Straighten elbows to raise cane or mop handle Hold _5__ seconds.  __10_ reps per set, _1-2__ sets per day, _5__ days per week Use steering wheel or hula hoop instead of frame. Copyright  VHI. All rights reserved.   "

## 2024-05-16 NOTE — Therapy (Signed)
 " OUTPATIENT OCCUPATIONAL THERAPY NEURO Treatment  Patient Name: Elwyn Lowden MRN: [id number] DOB:01-02-2001, 24 y.o., female Today's Date: 05/16/2024  PCP: Almarie Birmingham, NP REFERRING PROVIDER: Almarie Birmingham, NP  END OF SESSION:  OT End of Session - 05/16/24 1242     Visit Number 2    Number of Visits 12    Date for Recertification  07/13/24    Authorization Type 12 weeks    Authorization - Visit Number 2    Progress Note Due on Visit 10    OT Start Time 1239    OT Stop Time 1314    OT Time Calculation (min) 35 min    Activity Tolerance Patient tolerated treatment well          Past Medical History:  Diagnosis Date   Allergy    Cerebral palsy ([name])    CP (cerebral palsy) ([name])    Stroke [facility])    Past Surgical History:  Procedure Laterality Date   ARM SURGERY     2013   FOOT SURGERY     2010   HAMSTRING LENGTHENING     TIBIA / FIBIA LENGTHENING     Patient Active Problem List   Diagnosis Date Noted   Iron deficiency 12/21/2023   Vitamin D  deficiency 12/21/2023   Bilateral impacted cerumen 09/30/2022   Migraine without aura and without status migrainosus, not intractable 06/05/2022   Patellofemoral pain syndrome of left knee 06/03/2022   Knee cartilage, torn, left 05/21/2022   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   Balance problem 05/06/2018   Syncope 05/06/2018   History of cerebrovascular accident 11/18/2017   CP (cerebral palsy), spastic, 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[name]) 06/25/2017   Anxiety state 06/25/2017   Spasticity 06/25/2017   Allergic rhinitis 10/21/2016   Mild intermittent asthma 10/21/2016    ONSET DATE: 02/25/24- referral date  REFERRING DIAG: cerebral palsy  THERAPY DIAG:  Muscle weakness (generalized)  Unsteadiness on feet  Other lack of coordination  Other abnormalities of gait and mobility  Abnormal posture  Spastic quadriplegic cerebral palsy ([name])  Hemiplegia and hemiparesis following  unspecified cerebrovascular disease affecting left non-dominant side ([name])  Rationale for Evaluation and Treatment: Rehabilitation  SUBJECTIVE:   SUBJECTIVE STATEMENT: Pt reports she had a birthday Pt accompanied by: self  PERTINENT HISTORY: recent eye surgery, hx of CP see snapshot for PMH  PRECAUTIONS: Fall  WEIGHT BEARING RESTRICTIONSno  PAIN:  Are you having pain? No  FALLS: Has patient fallen in last 6 months? Yes. Number of falls tripped on crack  LIVING ENVIRONMENT: Lives with: lives with their family Lives in: House/apartment   PLOF: Needs assistance with ADLs and Needs assistance with homemaking  PATIENT GOALS: stretching HEP, splinting for LUE  OBJECTIVE:  Note: Objective measures were completed at Evaluation unless otherwise noted.  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 Shopping: needs assist for transportation Light housekeeping: does laundry, staightens up bedroom Meal Prep: cooks  mac and cheese on stove top mod I, microwave breakfast  Community mobility: mod I Medication management: keeps up with meds, family assists prn   MOBILITY STATUS: mod I      UPPER EXTREMITY ROM: LUE thumb remains flexed into palm, trace finger extension  Active ROM Right eval Left eval  Shoulder flexion  80  Shoulder abduction    Shoulder adduction    Shoulder extension    Shoulder internal rotation    Shoulder external rotation    Elbow flexion  120   Elbow extension  -20  Wrist flexion  60 at rest, passive extension to neutral  Wrist extension    Wrist ulnar deviation    Wrist radial deviation    Wrist pronation    Wrist supination    (Blank rows = not tested)     COORDINATION: Box and Blocks:  Right 3 blocks,   SENSATION: Light touch: Impaired  for LUE    MUSCLE TONE: LUE: Hypertonic,   COGNITION: Overall cognitive status: Pt was able to follow all commands related to eval    VISION ASSESSMENT:recent visual surgery, does not wear glasses       OBSERVATIONS: Pleasant female motivated to improve                                                                                                                              TREATMENT DATE:  05/16/24- cane exercises for chest press, shoulder flexion, then self ROM shoulder flexion assisting LUE with RUE, min-mod v.c and facilitation. Pt reports a fall today but she was unharmed and it was a controlled descent, pt reports no pain as a result. Prone on elbows lifting chest, mod v.c then modified knee plank mod facilitation,  Gentle passive stretch to thumb in abduction Standing to roll ball along mat for shoulder flexion, stretch with bilateral UE's and weightbearing. Functional grasp/ release of wooden dowel pegs with LUE , min-mod v.c, mod difficulty with release.  04/20/24- eval, see pt education        PATIENT EDUCATION: Education details:  cane exercise supine Person educated: Patient Education method: Explanation, demonstration Education comprehension: verbalized understanding, returned demonstration, and verbal cues required  HOME EXERCISE PROGRAM: beginning HEP,towel slides, thumb passive extension, picking up blocks 04/20/24   GOALS: Goals reviewed with patient? Yes  SHORT TERM GOALS: Target date: 04/20/24  I with inital HEP Baseline: Goal status:  ongoing 05/16/24  2.  I with updated splinting for improved LUE positioning. Baseline:  Goal  status: INITIAL  3.  Pt will utilize LUE as a stabilizer/ gross assist at least 10% of the time. Baseline: uses only 5% of the time Goal status: INITIAL  4.  Pt will increase A/ROM shoulder flexion/ scaption to 90* for improved functional reach. Baseline: 80* Goal status: INITIAL  5.  Pt will increase A/ROM elbow extension to -15 for improved functional use. Baseline:  Goal status: INITIAL    LONG TERM  GOALS: Target date: 07/14/23  I with updated HEP Baseline:  Goal status: INITIAL  2.  Pt will demonstrate improved LUE functional use as evidenced by increasing box/ blocks score to 5 blocks or greater. Baseline:  Goal status: INITIAL  3.  I with adapted strategies to mazimize pt's safety and I with ADLs/ IADLs. Baseline:  Goal status: INITIAL  4.  3.  Pt will utilize LUE as a stabilizer/ gross assist at least 15% of the time. Baseline: uses only 5% of the time Goal status: INITIAL     ASSESSMENT:  CLINICAL IMPRESSION: Patient is progressing towards goals. she was issued supine cane exercises to perform at home. Pt to bring in her splint next visit.PERFORMANCE DEFICITS: in functional skills including ADLs, IADLs, coordination, dexterity, sensation, ROM, strength, flexibility, Fine motor control, Gross motor control, mobility, balance, decreased knowledge of precautions, decreased knowledge of use of DME, and UE functional use,  and psychosocial skills including coping strategies, environmental adaptation, habits, interpersonal interactions, and routines and behaviors.   IMPAIRMENTS: are limiting patient from ADLs, IADLs, work, play, leisure, and social participation.   CO-MORBIDITIES: may have co-morbidities  that affects occupational performance. Patient will benefit from skilled OT to address above impairments and improve overall function.  MODIFICATION OR ASSISTANCE TO COMPLETE EVALUATION: No modification of tasks or assist necessary to complete an evaluation.  OT  OCCUPATIONAL PROFILE AND HISTORY: Detailed assessment: Review of records and additional review of physical, cognitive, psychosocial history related to current functional performance.  CLINICAL DECISION MAKING: LOW - limited treatment options, no task modification necessary  REHAB POTENTIAL: Good  EVALUATION COMPLEXITY: Low    PLAN:  OT FREQUENCY: 1x/week  OT DURATION: 12 weeks  PLANNED INTERVENTIONS: 97168 OT Re-evaluation, 97535 self care/ADL training, 02889 therapeutic exercise, 97530 therapeutic activity, 97112 neuromuscular re-education, 97140 manual therapy, 97113 aquatic therapy, 97035 ultrasound, 97018 paraffin, 02989 moist heat, 97010 cryotherapy, 97760 Orthotic Initial, 97763 Orthotic/Prosthetic subsequent, passive range of motion, balance training, functional mobility training, energy conservation, coping strategies training, patient/family education, and DME and/or AE instructions  RECOMMENDED OTHER SERVICES: n/a  CONSULTED AND AGREED WITH PLAN OF CARE: Patient  PLAN FOR NEXT SESSION: further assess splint needs, functional use of LUE   Annalicia Renfrew, OT 05/16/2024, 12:47 PM           "

## 2024-05-23 ENCOUNTER — Encounter: Payer: Self-pay | Admitting: Occupational Therapy

## 2024-05-23 ENCOUNTER — Ambulatory Visit: Admitting: Occupational Therapy

## 2024-05-23 ENCOUNTER — Ambulatory Visit: Admitting: Physical Therapy

## 2024-05-23 ENCOUNTER — Encounter: Payer: Self-pay | Admitting: Physical Therapy

## 2024-05-23 DIAGNOSIS — R278 Other lack of coordination: Secondary | ICD-10-CM

## 2024-05-23 DIAGNOSIS — G800 Spastic quadriplegic cerebral palsy: Secondary | ICD-10-CM

## 2024-05-23 DIAGNOSIS — I69954 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: Secondary | ICD-10-CM

## 2024-05-23 DIAGNOSIS — R2681 Unsteadiness on feet: Secondary | ICD-10-CM

## 2024-05-23 DIAGNOSIS — M6281 Muscle weakness (generalized): Secondary | ICD-10-CM

## 2024-05-23 DIAGNOSIS — R2689 Other abnormalities of gait and mobility: Secondary | ICD-10-CM

## 2024-05-23 DIAGNOSIS — R293 Abnormal posture: Secondary | ICD-10-CM

## 2024-05-23 DIAGNOSIS — R29818 Other symptoms and signs involving the nervous system: Secondary | ICD-10-CM

## 2024-05-23 DIAGNOSIS — G801 Spastic diplegic cerebral palsy: Secondary | ICD-10-CM

## 2024-05-23 DIAGNOSIS — R262 Difficulty in walking, not elsewhere classified: Secondary | ICD-10-CM

## 2024-05-23 NOTE — Therapy (Signed)
 " OUTPATIENT OCCUPATIONAL THERAPY NEURO Treatment  Patient Name: Kerri Robertson MRN: [id number] DOB:[birth year], 24 y.o., female Today's Date: 05/23/2024  PCP: Almarie Birmingham, NP REFERRING PROVIDER: Almarie Birmingham, NP  END OF SESSION:  OT End of Session - 05/23/24 1539     Visit Number 3    Number of Visits 12    Date for Recertification  07/13/24    Authorization Type 12 weeks    Authorization Time Period 12 weeks    Authorization - Visit Number 3    Progress Note Due on Visit 10    OT Start Time 1233    OT Stop Time 1314    OT Time Calculation (min) 41 min           Past Medical History:  Diagnosis Date   Allergy    Cerebral palsy ([name])    CP (cerebral palsy) ([name])    Stroke [facility])    Past Surgical History:  Procedure Laterality Date   ARM SURGERY     2013   FOOT SURGERY     2010   HAMSTRING LENGTHENING     TIBIA / FIBIA LENGTHENING     Patient Active Problem List   Diagnosis Date Noted   Iron deficiency 12/21/2023   Vitamin D  deficiency 12/21/2023   Bilateral impacted cerumen 09/30/2022   Migraine without aura and without status migrainosus, not intractable 06/05/2022   Patellofemoral pain syndrome of left knee 06/03/2022   Knee cartilage, torn, left 05/21/2022   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   Balance problem 05/06/2018   Syncope 05/06/2018   History of cerebrovascular accident 11/18/2017   CP (cerebral palsy), spastic, 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[name]) 06/25/2017   Anxiety state 06/25/2017   Spasticity 06/25/2017   Allergic rhinitis 10/21/2016   Mild intermittent asthma 10/21/2016    ONSET DATE: 02/25/24- referral date  REFERRING DIAG: cerebral palsy  THERAPY DIAG:  Muscle weakness (generalized)  Unsteadiness on feet  Other lack of coordination  Abnormal posture  Spastic quadriplegic cerebral palsy ([name])  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 No  FALLS: Has patient fallen in last 6 months? Yes. Number of falls tripped on crack  LIVING ENVIRONMENT: Lives with: lives with their family Lives in: House/apartment   PLOF: Needs assistance with ADLs and Needs assistance with homemaking  PATIENT GOALS: stretching HEP, splinting for LUE  OBJECTIVE:  Note: Objective measures were completed at Evaluation unless otherwise noted.  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 Shopping: needs assist for transportation Light housekeeping: does laundry,  staightens up bedroom Meal Prep: cooks mac and cheese on stove top mod I, microwave breakfast  Community mobility: mod I Medication management: keeps up with meds, family assists prn   MOBILITY STATUS: mod I      UPPER EXTREMITY ROM: LUE thumb remains flexed into palm, trace finger extension  Active ROM Right eval Left eval  Shoulder flexion  80  Shoulder abduction    Shoulder adduction    Shoulder extension    Shoulder internal rotation    Shoulder external rotation     Elbow flexion  120  Elbow extension  -20  Wrist flexion  60 at rest, passive extension to neutral  Wrist extension    Wrist ulnar deviation    Wrist radial deviation    Wrist pronation    Wrist supination    (Blank rows = not tested)     COORDINATION: Box and Blocks:  Right 3 blocks,   SENSATION: Light touch: Impaired  for LUE    MUSCLE TONE: LUE: Hypertonic,   COGNITION: Overall cognitive status: Pt was able to follow all commands related to eval    VISION ASSESSMENT:recent visual surgery, does not wear glasses       OBSERVATIONS: Pleasant female motivated to improve                                                                                                                              TREATMENT DATE:  05/23/24-Pt was accompanied by her mom. Pt arrived with previous resting hand splint. Pt has not been wearing it. Minor adjustments to splint with new padding and strapping. Pt's splint fits well and it promotes finger extension and thumb abduction, however wrsit is in slight flexion.  Seated chest press and shoulder flexion/ elbow extension reach for floor, min v.c  Functional grasp/ release of wooden dowel pegs with LUE, min-mod difficulty/ occasional v.c. Pt was instructed to wear splint for 1-2 hours at a time while resting/ watching TV and to remove if any issues. Pt/ mom would like a splint that promoted wrist extension. Therapist initated fabrication of resting hand splint with finger flexion and wrist in a more neutral postion. Splint was not completed due to time constraints.   05/16/24- cane exercises for chest press, shoulder flexion, then self ROM shoulder flexion assisting LUE with RUE, min-mod v.c and facilitation. Pt reports a fall today but she was unharmed and it was a controlled descent, pt reports no pain as a result. Prone on elbows lifting chest, mod v.c then modified knee plank mod facilitation,  Gentle passive stretch to thumb in  abduction Standing to roll ball along mat for shoulder flexion, stretch with bilateral UE's and weightbearing. Functional grasp/ release of wooden dowel pegs with LUE , min-mod v.c, mod difficulty with release.  04/20/24- eval, see pt education        PATIENT EDUCATION: Education details:   splint wear, care and precuations Person educated: Patient, mom Education  method: Explanation, demonstration Education comprehension: verbalized understanding, returned demonstration, and verbal cues required  HOME EXERCISE PROGRAM: beginning HEP,towel slides, thumb passive extension, picking up blocks 04/20/24   GOALS: Goals reviewed with patient? Yes  SHORT TERM GOALS: Target date: 04/20/24  I with inital HEP Baseline: Goal status:  ongoing 05/16/24  2.  I with updated splinting for improved LUE positioning. Baseline:  Goal status: ongoing, pt to start out wearing previous splint, new splinting initated 05/23/24  3.  Pt will utilize LUE as a stabilizer/ gross assist at least 10% of the time. Baseline: uses only 5% of the time Goal status: INITIAL  4.  Pt will increase A/ROM shoulder flexion/ scaption to 90* for improved functional reach. Baseline: 80* Goal status: INITIAL  5.  Pt will increase A/ROM elbow extension to -15 for improved functional use. Baseline:  Goal status: INITIAL    LONG TERM GOALS: Target date: 07/14/23  I with updated HEP Baseline:  Goal status: INITIAL  2.  Pt will demonstrate improved LUE functional use as evidenced by increasing box/ blocks score to 5 blocks or greater. Baseline:  Goal status: INITIAL  3.  I with adapted strategies to mazimize pt's safety and I with ADLs/ IADLs. Baseline:  Goal status: INITIAL  4.  3.  Pt will utilize LUE as a stabilizer/ gross assist at least 15% of the time. Baseline: uses only 5% of the time Goal status: INITIAL     ASSESSMENT:  CLINICAL IMPRESSION: Patient is progressing towards goals. Pt/ mom  verbalzie understanding of previous resting hand splint wear and care. New splint to promote neutral wrist was fabricated today however it was not completed due to time constraints.     PERFORMANCE DEFICITS: in functional skills including ADLs, IADLs, coordination, dexterity, sensation, ROM, strength, flexibility, Fine motor control, Gross motor control, mobility, balance, decreased knowledge of precautions, decreased knowledge of use of DME, and UE functional use,  and psychosocial skills including coping strategies, environmental adaptation, habits, interpersonal interactions, and routines and behaviors.   IMPAIRMENTS: are limiting patient from ADLs, IADLs, work, play, leisure, and social participation.   CO-MORBIDITIES: may have co-morbidities  that affects occupational performance. Patient will benefit from skilled OT to address above impairments and improve overall function.  MODIFICATION OR ASSISTANCE TO COMPLETE EVALUATION: No modification of tasks or assist necessary to complete an evaluation.  OT OCCUPATIONAL PROFILE AND HISTORY: Detailed assessment: Review of records and additional review of physical, cognitive, psychosocial history related to current functional performance.  CLINICAL DECISION MAKING: LOW - limited treatment options, no task modification necessary  REHAB POTENTIAL: Good  EVALUATION COMPLEXITY: Low    PLAN:  OT FREQUENCY: 1x/week  OT DURATION: 12 weeks  PLANNED INTERVENTIONS: 97168 OT Re-evaluation, 97535 self care/ADL training, 02889 therapeutic exercise, 97530 therapeutic activity, 97112 neuromuscular re-education, 97140 manual therapy, 97113 aquatic therapy, 97035 ultrasound, 97018 paraffin, 02989 moist heat, 97010 cryotherapy, 97760 Orthotic Initial, 97763 Orthotic/Prosthetic subsequent, passive range of motion, balance training, functional mobility training, energy conservation, coping strategies training, patient/family education, and DME and/or AE  instructions  RECOMMENDED OTHER SERVICES: n/a  CONSULTED AND AGREED WITH PLAN OF CARE: Patient  PLAN FOR NEXT SESSION:  complete new splint   Rachelann Enloe, OT 05/23/2024, 4:21 PM           "

## 2024-05-23 NOTE — Therapy (Signed)
 " OUTPATIENT PHYSICAL THERAPY NEURO TREATMENT   Patient Name: Kerri Robertson MRN: [id number] DOB:02/07/2001, 24 y.o., female Today's Date: 05/23/2024   PCP: Kerri Robertson REFERRING PROVIDER: Waddell Robertson  END OF SESSION:  PT End of Session - 05/23/24 1233     Visit Number 3    Date for Recertification  07/19/24    Authorization Type Medicare    PT Start Time 1314    PT Stop Time 1400    PT Time Calculation (min) 46 min    Activity Tolerance Patient tolerated treatment well    Behavior During Therapy WFL for tasks assessed/performed           Past Medical History:  Diagnosis Date   Allergy    Cerebral palsy ([name])    CP (cerebral palsy) ([name])    Stroke [facility])    Past Surgical History:  Procedure Laterality Date   ARM SURGERY     2013   FOOT SURGERY     2010   HAMSTRING LENGTHENING     TIBIA / FIBIA LENGTHENING     Patient Active Problem List   Diagnosis Date Noted   Iron deficiency 12/21/2023   Vitamin D  deficiency 12/21/2023   Bilateral impacted cerumen 09/30/2022   Migraine without aura and without status migrainosus, not intractable 06/05/2022   Patellofemoral pain syndrome of left knee 06/03/2022   Knee cartilage, torn, left 05/21/2022   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   Balance problem 05/06/2018   Syncope 05/06/2018   History of cerebrovascular accident 11/18/2017   CP (cerebral palsy), spastic, 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[name]) 06/25/2017   Anxiety state 06/25/2017   Spasticity 06/25/2017   Allergic rhinitis 10/21/2016   Mild intermittent asthma 10/21/2016    ONSET DATE: 07/09/23- new referral  REFERRING DIAG:  G80.0 (ICD-10-CM) - CP (cerebral palsy), spastic, 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[name])      THERAPY DIAG:  Muscle weakness (generalized)  Unsteadiness on feet  Other lack of coordination  Abnormal posture  Spastic quadriplegic cerebral palsy ([name])  Other abnormalities of gait and mobility  Hemiplegia and  hemiparesis following unspecified cerebrovascular disease affecting left non-dominant side ([name])  Cerebral palsy with spastic diplegia ([name])  Difficulty in walking, not elsewhere classified  Rationale for Evaluation and Treatment: Rehabilitation  SUBJECTIVE:                                                                                                                                                                                             SUBJECTIVE STATEMENT: No new falls, no c/o pain or soreness, mom is present, has had two recent falls,  worries about when Kerri Robertson is tired she sometimes cannot go further.  There is a need for a letter of medical neccessity Pt accompanied by: caretaker   PERTINENT HISTORY: CP 07/09/23-- Kerri Robertson is a 24 year old female who presents for a new physical therapy referral   PAIN:  Are you having pain? No  PRECAUTIONS: Fall  RED FLAGS: None   WEIGHT BEARING RESTRICTIONS: No  FALLS: Has patient fallen in last 6 months? Yes. Number of falls 1  LIVING ENVIRONMENT: Lives with: lives with their family Lives in: House/apartment Stairs: 12 steps , has a handrail  PLOF: Independent with basic ADLs  Works at [employer] and at [employer], both jobs mostly sitting  PATIENT GOALS: build more endurance, will be getting a leg brace after she turns 24  OBJECTIVE:  Note: Objective measures were completed at Evaluation unless otherwise noted.  DIAGNOSTIC FINDINGS: IMPRESSION: 1. The acetabulum is relatively shallow and the left femoral head demonstrates an unusual contour. These findings may be developmental secondary to the patient's cerebral palsy. 2. No other abnormalities.  COGNITION: Overall cognitive status: Within functional limits for tasks assessed   SENSATION: WFL  COORDINATION: Decrease coordination d/t CP  MUSCLE TONE: LLE: Mild and Moderate  MUSCLE LENGTH: Hamstrings and hip flexors are tight on both sides.   POSTURE: rounded shoulders,  left pelvic obliquity, and weight shift right  LOWER EXTREMITY ROM:  Trunk lateraly flexed due to scoliosis, L hip unable to achieve neutral ER, abd limited, B hamstrings tight, L ankle does not achieve neutral DF or inversion. Her L foot in turned inwards more, increased IR of hip. LUE with limited ROM at all joints, in all planes. She wears a splint on her wrist at night some nights.   LOWER EXTREMITY MMT:  MMT accuracy is questionable due to tone   MMT Right Eval Left Eval  Hip flexion 4 4-  Hip extension    Hip abduction 4 3+  Hip adduction 4 3+  Hip internal rotation    Hip external rotation    Knee flexion 4 3  Knee extension 4 4-  Ankle dorsiflexion  1  Ankle plantarflexion    Ankle inversion  1  Ankle eversion    (Blank rows = not tested)  TRANSFERS: Assistive device utilized: None  Sit to stand: Modified independence Stand to sit: Modified independence Chair to chair: Modified independence Floor: Mod A  CURB:  Level of Assistance: Modified independence Assistive device utilized: None  STAIRS: Level of Assistance: Modified independence Stair Negotiation Technique: Step to Pattern Alternating Pattern  with Single Rail on Right  GAIT: Gait pattern: step to pattern, decreased arm swing- Left, decreased step length- Left, decreased stance time- Left, decreased stride length, ataxic, trunk rotated posterior- Left, narrow BOS, and poor foot clearance- Left Distance walked: clinic distances Assistive device utilized: None Level of assistance: Modified independence  FUNCTIONAL TESTS:  5 times sit to stand: 14.63s in March 2025, 17 seconds 04/20/24 Timed up and go (TUG): 13.06s March 2025, 15 seconds 04/20/24  TREATMENT DATE: 05/23/24 Measured Kinya for Cionic mid Thigh, mid calf and inseam, measured in standing and in supine. Feet on ball K2C, rotation, bridge and isometric abs Ball b/n  knees squeeze and bridge Green tband clamshells Left leg blue tband hip extension LEg press both legs 40# ball b/n knees, 20# left only with PT holding knee in good position Leg curls 10# left only 20# with both Leg extension 5# cues for TKE Went over to take pictures if she is going to go to the gym   05/16/24 Assessed right knee some popping and tight posterior, non tender Feet on ball K2C, rotation, bridges and isometric abs Red tband clamshells in hooklying Supine red tband left hip extension, blue right Supine left leg abduction on sliding board with her trying to keep toes up Passive stretch left LE Side lying left with 3# clamshells right  3# SAQ 2x10 each leg Passive stretch right LE Side steppping HHA LEg press with large ball between knees for positioning 20# 2x10 Walking ball toss  04/20/24 evaluation   PATIENT EDUCATION: Education details: POC, reviewed HEP with her and Litzy her caretaker today Person educated: Patient and Software Engineer Education method: Explanation Education comprehension: verbalized understanding  HOME EXERCISE PROGRAM: Access Code: [email or web address] URL: [email or web address] Date: 04/20/2024 Prepared by: Ozell Mainland  Exercises - Supine Bridge  - 1 x daily - 7 x weekly - 3 sets - 10 reps - 3 hold - Supine Lower Trunk Rotation  - 1 x daily - 7 x weekly - 3 sets - 10 reps - 3 hold - Standing Hip Abduction with Counter Support  - 1 x daily - 7 x weekly - 3 sets - 10 reps - 3 hold - Standing March with Counter Support  - 1 x daily - 7 x weekly - 3 sets - 10 reps - 3 hold  GOALS: Goals reviewed with patient? Yes  SHORT TERM GOALS: Target date: 05/21/24  Patient will be independent with initial HEP. Baseline: has an old HEP but not doing Goal status:Ongoing 05/16/24  2.  Patient will demonstrate decreased fall risk by scoring < 12 sec on 5xSTS. Baseline: 17 seconds Goal status: INITIAL   LONG TERM GOALS: Target date:  07/19/24  Patient will be independent with advanced/ongoing HEP to improve outcomes and carryover.  Baseline:  Goal status: INITIAL  2.  Patient will be able to complete floor transfer with minA   Baseline: mod-maxA needed when she falls Goal status: INITIAL  3.  Patient will report no falls in an 8 week period   Baseline: had a fall in November Goal status: ongoing had a fall last week 05/23/24  4.  Patient will demonstrate improved functional LE strength as demonstrated by 4/5 in LLE. Baseline: see chart Goal status: INITIAL  5.  Patient will ambulate with more normalized gait pattern and less internal rotation of LLE  Baseline: caretakers reports her LLE in turning in more than usual  Goal status: INITIAL 6.  Patient will decrease TUG time to 12 seconds  Baseline:  15 seconds  Goal Status:  Initial   ASSESSMENT:  CLINICAL IMPRESSION: Patient and her mom are present today, mom needed help with some measurements for Cionic device, I performed the measurements in standing and in supine.  I asked the patient and her mom to possibly take pictures of gym equipment as we spoke that to benefit the most work outside of PT needs to be done, if she can bring pictures we can  talk with her and the caregiver regarding safety and set up for Shuntel.  We discussed the need for a wheelchair, due to Tonita having fatigue issues when travelling at holidays and when going out shopping.  They report difficulty when flying and also after grocery shopping that she is very fatigued and at times needs to sit and rest, or she is at a risk for falls. Patient is a 24 y.o. female who was seen today for physical therapy evaluation and treatment for CP. She is a returning patient who had fall recently, she reports that she is not picking up her feet very well.  She reports that her and her family recently moved and she was struggling to go up and down the stairs at the new place.   Her LLE is weak but it is hard to  tell how accurate it is due to her tone.  She has increased TUG and 5XSTS tests of about 2 seconds slower than back in March of this year.   Patient will benefit from ongoing strength training and balance to decrease her risk for falls. She will also benefit from stretching to increase her flexibility, especially in her hips.   OBJECTIVE IMPAIRMENTS: Abnormal gait, decreased balance, decreased coordination, decreased endurance, decreased ROM, decreased strength, impaired tone, impaired UE functional use, and postural dysfunction.   ACTIVITY LIMITATIONS: squatting, stairs, transfers, and locomotion level  PERSONAL FACTORS: Age, Behavior pattern, Past/current experiences, and Time since onset of injury/illness/exacerbation are also affecting patient's functional outcome.   REHAB POTENTIAL: Fair chronicity of symptoms  CLINICAL DECISION MAKING: Stable/uncomplicated  EVALUATION COMPLEXITY: Low  PLAN:  PT FREQUENCY: 1x/week  PT DURATION: 12 weeks  PLANNED INTERVENTIONS: 97110-Therapeutic exercises, 97530- Therapeutic activity, [id number]- Neuromuscular re-education, 97535- Self Care, 02859- Manual therapy, [phone]- Gait training, Patient/Family education, Balance training, Stair training, Taping, Dry Needling, Joint mobilization, Spinal mobilization, Cryotherapy, and Moist heat  PLAN FOR NEXT SESSION: LE stretching, strengthening hip abd, balance, functional strengthening, I contacted Kerri Mon, NP regarding a referral for aquatic PT.     OBADIAH OZELL ORN, PT 05/23/2024, 12:34 PM  "

## 2024-05-25 ENCOUNTER — Telehealth: Payer: Self-pay | Admitting: Family Medicine

## 2024-05-25 DIAGNOSIS — R2689 Other abnormalities of gait and mobility: Secondary | ICD-10-CM

## 2024-05-25 DIAGNOSIS — G800 Spastic quadriplegic cerebral palsy: Secondary | ICD-10-CM

## 2024-05-25 NOTE — Telephone Encounter (Signed)
-----   Message from Montgomery W sent at 05/23/2024  2:50 PM EST ----- Ms. Almarie, I saw [facility] and her mom in PT today, They had mentioned aquatic therapy that they had done in the past with good results for [facility]. Would you mind, if you feel it would be beneficial, to put in a referral to our work queue for her to have Aquatic Therapy.I can then get her to the aquatic team at our [facility] site. Our work queue is [employer].  Thank you so much.  Ozell Mainland, PT

## 2024-05-30 ENCOUNTER — Ambulatory Visit: Admitting: Occupational Therapy

## 2024-05-30 ENCOUNTER — Ambulatory Visit: Admitting: Physical Therapy

## 2024-06-06 ENCOUNTER — Ambulatory Visit: Admitting: Physical Therapy

## 2024-06-06 ENCOUNTER — Ambulatory Visit: Admitting: Occupational Therapy
